# Patient Record
Sex: Female | Born: 1995 | Race: Black or African American | Hispanic: No | Marital: Single | Smoking: Current every day smoker
Health system: Southern US, Community
[De-identification: ages and names within clinical notes are randomized; demographics above are authoritative.]

## PROBLEM LIST (undated history)

## (undated) ENCOUNTER — Inpatient Hospital Stay (HOSPITAL_COMMUNITY): Payer: Self-pay

## (undated) DIAGNOSIS — F32A Depression, unspecified: Secondary | ICD-10-CM

## (undated) DIAGNOSIS — L309 Dermatitis, unspecified: Secondary | ICD-10-CM

## (undated) DIAGNOSIS — T148XXA Other injury of unspecified body region, initial encounter: Secondary | ICD-10-CM

## (undated) DIAGNOSIS — F329 Major depressive disorder, single episode, unspecified: Secondary | ICD-10-CM

## (undated) HISTORY — PX: INDUCED ABORTION: SHX677

## (undated) HISTORY — PX: WISDOM TOOTH EXTRACTION: SHX21

---
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
## 2013-01-29 ENCOUNTER — Encounter (HOSPITAL_COMMUNITY): Payer: Self-pay | Admitting: *Deleted

## 2013-01-29 ENCOUNTER — Inpatient Hospital Stay (HOSPITAL_COMMUNITY)
Admission: AD | Admit: 2013-01-29 | Discharge: 2013-01-29 | Disposition: A | Discharge status: Home / Self Care | Payer: Medicaid Other | Source: Ambulatory Visit | Attending: Obstetrics & Gynecology | Admitting: Obstetrics & Gynecology

## 2013-01-29 DIAGNOSIS — Z3201 Encounter for pregnancy test, result positive: Secondary | ICD-10-CM | POA: Insufficient documentation

## 2013-01-29 DIAGNOSIS — O210 Mild hyperemesis gravidarum: Secondary | ICD-10-CM

## 2013-01-29 DIAGNOSIS — R112 Nausea with vomiting, unspecified: Secondary | ICD-10-CM | POA: Insufficient documentation

## 2013-01-29 DIAGNOSIS — O219 Vomiting of pregnancy, unspecified: Secondary | ICD-10-CM

## 2013-01-29 LAB — URINE MICROSCOPIC-ADD ON

## 2013-01-29 LAB — URINALYSIS, ROUTINE W REFLEX MICROSCOPIC
Glucose, UA: NEGATIVE mg/dL
Ketones, ur: NEGATIVE mg/dL
Nitrite: NEGATIVE
Protein, ur: NEGATIVE mg/dL
Urobilinogen, UA: 0.2 mg/dL (ref 0.0–1.0)

## 2013-01-29 MED ORDER — PROMETHAZINE HCL 25 MG PO TABS: 25.0000 mg | ORAL_TABLET | Freq: Four times a day (QID) | ORAL | Status: DC | PRN

## 2013-01-29 MED ORDER — ONDANSETRON HCL 4 MG PO TABS: 4.0000 mg | ORAL_TABLET | Freq: Four times a day (QID) | ORAL | Status: DC

## 2013-01-29 NOTE — MAU Note (Signed)
Pt reports she has had constant nausea and vomiting for the past week.. Pt had positive pregnancy test at home.

## 2013-01-29 NOTE — MAU Provider Note (Signed)

## 2013-01-29 NOTE — MAU Provider Note (Signed)
History     CSN: [id number]  Arrival date and time: 01/29/13 1537   First Provider Initiated Contact with Patient 01/29/13 1627      Chief Complaint  Patient presents with  . Nausea   HPI Ms. Avagail A Oliva is a 17 y.o. G2P1001 at Unknown who presents to MAU today with N/V x 1 week. She denies abdominal pain, vaginal bleeding, discharge, fever, dizziness today. She has not had a period since the baby was born on 10/02/12.   OB History   Grav Para Term Preterm Abortions TAB SAB Ect Mult Living   2 1 1  0 0 0 0 0 0 1      Past Medical History  Diagnosis Date  . Asthma   . Eczema   . Fracture     ring finger right hand  . Asthma   . Depression     Past Surgical History  Procedure Laterality Date  . Wisdom tooth extraction      Family History  Problem Relation Age of Onset  . Hearing loss Neg Hx   . Diabetes Maternal Grandmother   . Hypertension Maternal Grandmother     History  Substance Use Topics  . Smoking status: Former Smoker -- 0.50 packs/day for .5 years    Types: Cigarettes    Quit date: 03/01/2012  . Smokeless tobacco: Never Used  . Alcohol Use: No     Comment: Pt currently smokes 3-4 x per week    Allergies:  Allergies  Allergen Reactions  . Shellfish Allergy Anaphylaxis    Prescriptions prior to admission  Medication Sig Dispense Refill  . acetaminophen (TYLENOL) 325 MG tablet Take 650 mg by mouth every 6 (six) hours as needed for pain.      Marland Kitchen albuterol (PROVENTIL HFA;VENTOLIN HFA) 108 (90 BASE) MCG/ACT inhaler Inhale 2 puffs into the lungs every 4 (four) hours as needed. For asthma      . HYDROcodone-ibuprofen (VICOPROFEN) 7.5-200 MG per tablet Take 1 tablet by mouth every 6 (six) hours as needed for pain.  15 tablet  0  . Prenatal Vit-Fe Fumarate-FA (PRENATAL MULTIVITAMIN) TABS Take 1 tablet by mouth daily at 12 noon.        Review of Systems  Constitutional: Negative for fever and malaise/fatigue.  Gastrointestinal: Positive for nausea  and vomiting. Negative for abdominal pain, diarrhea and constipation.  Genitourinary: Negative for dysuria, urgency and frequency.       Neg - vaginal bleeding, discharge  Neurological: Negative for dizziness, loss of consciousness and weakness.   Physical Exam   Blood pressure 109/87, pulse 74, temperature 98.3 F (36.8 C), temperature source Oral, resp. rate 18, height 5\' 1"  (1.549 m), weight 149 lb 12.8 oz (67.949 kg), last menstrual period 01/11/2012, not currently breastfeeding.  Physical Exam  Constitutional: She is oriented to person, place, and time. She appears well-developed and well-nourished. No distress.  HENT:  Head: Normocephalic and atraumatic.  Cardiovascular: Normal rate, regular rhythm and normal heart sounds.   Respiratory: Effort normal and breath sounds normal. No respiratory distress.  GI: Soft. Bowel sounds are normal. She exhibits no distension and no mass. There is no tenderness. There is no rebound and no guarding.  Neurological: She is alert and oriented to person, place, and time.  Skin: Skin is warm and dry. No erythema.  Psychiatric: She has a normal mood and affect.   Results for orders placed during the hospital encounter of 01/29/13 (from the past 24 hour(s))  URINALYSIS, ROUTINE  W REFLEX MICROSCOPIC     Status: Abnormal   Collection Time    01/29/13  3:53 PM      Result Value Range   Color, Urine YELLOW  YELLOW   APPearance HAZY (*) CLEAR   Specific Gravity, Urine 1.025  1.005 - 1.030   pH 6.0  5.0 - 8.0   Glucose, UA NEGATIVE  NEGATIVE mg/dL   Hgb urine dipstick NEGATIVE  NEGATIVE   Bilirubin Urine NEGATIVE  NEGATIVE   Ketones, ur NEGATIVE  NEGATIVE mg/dL   Protein, ur NEGATIVE  NEGATIVE mg/dL   Urobilinogen, UA 0.2  0.0 - 1.0 mg/dL   Nitrite NEGATIVE  NEGATIVE   Leukocytes, UA TRACE (*) NEGATIVE  URINE MICROSCOPIC-ADD ON     Status: Abnormal   Collection Time    01/29/13  3:53 PM      Result Value Range   Squamous Epithelial / LPF FEW (*)  RARE   WBC, UA 3-6  <3 WBC/hpf   Urine-Other MUCOUS PRESENT    POCT PREGNANCY, URINE     Status: Abnormal   Collection Time    01/29/13  4:08 PM      Result Value Range   Preg Test, Ur POSITIVE (*) NEGATIVE     MAU Course  Procedures None  MDM Patient is not nauseous now. Would like Rx to try at home. Patient does not desire pregnancy at this time and may terminate pregnancy.   Assessment and Plan  A: Positive pregnancy test Nausea and vomiting in pregnancy prior to [redacted] weeks gestation  P: Discharge home Rx for Zofran and Phenergan sent to patient's pharmacy Discussed first trimester warning signs Patient may return to MAU as needed or if her condition were to change or worsen  Freddi Starr, PA-C  01/29/2013, 4:27 PM

## 2013-02-17 ENCOUNTER — Encounter (HOSPITAL_COMMUNITY): Payer: Self-pay | Admitting: Emergency Medicine

## 2013-02-17 ENCOUNTER — Emergency Department (HOSPITAL_COMMUNITY)
Admission: EM | Admit: 2013-02-17 | Discharge: 2013-02-18 | Disposition: A | Discharge status: Other Acute Inpatient Hospital | Payer: Medicaid Other | Attending: Emergency Medicine | Admitting: Emergency Medicine

## 2013-02-17 DIAGNOSIS — F3289 Other specified depressive episodes: Secondary | ICD-10-CM | POA: Insufficient documentation

## 2013-02-17 DIAGNOSIS — J45909 Unspecified asthma, uncomplicated: Secondary | ICD-10-CM | POA: Insufficient documentation

## 2013-02-17 DIAGNOSIS — T4271XA Poisoning by unspecified antiepileptic and sedative-hypnotic drugs, accidental (unintentional), initial encounter: Secondary | ICD-10-CM | POA: Insufficient documentation

## 2013-02-17 DIAGNOSIS — Z8781 Personal history of (healed) traumatic fracture: Secondary | ICD-10-CM | POA: Insufficient documentation

## 2013-02-17 DIAGNOSIS — T426X2A Poisoning by other antiepileptic and sedative-hypnotic drugs, intentional self-harm, initial encounter: Secondary | ICD-10-CM | POA: Insufficient documentation

## 2013-02-17 DIAGNOSIS — Z872 Personal history of diseases of the skin and subcutaneous tissue: Secondary | ICD-10-CM | POA: Insufficient documentation

## 2013-02-17 DIAGNOSIS — O0990 Supervision of high risk pregnancy, unspecified, unspecified trimester: Secondary | ICD-10-CM

## 2013-02-17 DIAGNOSIS — O99340 Other mental disorders complicating pregnancy, unspecified trimester: Secondary | ICD-10-CM | POA: Insufficient documentation

## 2013-02-17 DIAGNOSIS — T4272XA Poisoning by unspecified antiepileptic and sedative-hypnotic drugs, intentional self-harm, initial encounter: Secondary | ICD-10-CM | POA: Insufficient documentation

## 2013-02-17 DIAGNOSIS — F329 Major depressive disorder, single episode, unspecified: Secondary | ICD-10-CM

## 2013-02-17 DIAGNOSIS — Z3490 Encounter for supervision of normal pregnancy, unspecified, unspecified trimester: Secondary | ICD-10-CM

## 2013-02-17 DIAGNOSIS — O9989 Other specified diseases and conditions complicating pregnancy, childbirth and the puerperium: Secondary | ICD-10-CM | POA: Insufficient documentation

## 2013-02-17 DIAGNOSIS — F32A Depression, unspecified: Secondary | ICD-10-CM

## 2013-02-17 DIAGNOSIS — Z87891 Personal history of nicotine dependence: Secondary | ICD-10-CM | POA: Insufficient documentation

## 2013-02-17 LAB — ACETAMINOPHEN LEVEL
Acetaminophen (Tylenol), Serum: <15 ug/mL (ref 10–30)
Acetaminophen (Tylenol), Serum: <15 ug/mL (ref 10–30)

## 2013-02-17 LAB — URINALYSIS, ROUTINE W REFLEX MICROSCOPIC
Bilirubin Urine: NEGATIVE
Leukocytes, UA: NEGATIVE
Nitrite: NEGATIVE
Specific Gravity, Urine: 1.018 (ref 1.005–1.030)
Urobilinogen, UA: 0.2 mg/dL (ref 0.0–1.0)
pH: 5.5 (ref 5.0–8.0)

## 2013-02-17 LAB — CBC WITH DIFFERENTIAL/PLATELET
Basophils Relative: 1 % (ref 0–1)
Eosinophils Absolute: 0.2 10*3/uL (ref 0.0–0.7)
Eosinophils Relative: 2 % (ref 0–5)
Hemoglobin: 12.2 g/dL (ref 12.0–15.0)
MCH: 29.1 pg (ref 26.0–34.0)
MCHC: 34 g/dL (ref 30.0–36.0)
MCV: 85.7 fL (ref 78.0–100.0)
Monocytes Relative: 8 % (ref 3–12)
Neutrophils Relative %: 45 % (ref 43–77)
Platelets: 264 10*3/uL (ref 150–400)

## 2013-02-17 LAB — COMPREHENSIVE METABOLIC PANEL
ALT: 13 U/L (ref 0–35)
AST: 17 U/L (ref 0–37)
Albumin: 4 g/dL (ref 3.5–5.2)
CO2: 22 mEq/L (ref 19–32)
Calcium: 9.5 mg/dL (ref 8.4–10.5)
Creatinine, Ser: 0.49 mg/dL — ABNORMAL LOW (ref 0.50–1.10)
GFR calc non Af Amer: >90 mL/min (ref >90)
Sodium: 132 mEq/L — ABNORMAL LOW (ref 135–145)
Total Protein: 7.4 g/dL (ref 6.0–8.3)

## 2013-02-17 LAB — RAPID URINE DRUG SCREEN, HOSP PERFORMED
Barbiturates: NOT DETECTED
Cocaine: NOT DETECTED
Tetrahydrocannabinol: POSITIVE — AB

## 2013-02-17 LAB — HCG, QUANTITATIVE, PREGNANCY: hCG, Beta Chain, Quant, S: 84855 m[IU]/mL — ABNORMAL HIGH (ref <5)

## 2013-02-17 LAB — SALICYLATE LEVEL: Salicylate Lvl: <2 mg/dL — ABNORMAL LOW (ref 2.8–20.0)

## 2013-02-17 MED ORDER — POTASSIUM CHLORIDE CRYS ER 20 MEQ PO TBCR
40.0000 meq | EXTENDED_RELEASE_TABLET | Freq: Three times a day (TID) | ORAL | Status: DC
  Administered 2013-02-17 – 2013-02-18 (×2): 40 meq via ORAL
  Filled 2013-02-17 (×2): qty 2

## 2013-02-17 MED ORDER — ONDANSETRON HCL 4 MG PO TABS: 4.0000 mg | ORAL_TABLET | Freq: Three times a day (TID) | ORAL | Status: DC | PRN

## 2013-02-17 NOTE — ED Notes (Signed)
Called Poison control , reported this case and spoke to Ms. Alexia Freestone, Charity fundraiser.  To monitor level of alertness. To call back for update.

## 2013-02-17 NOTE — ED Notes (Signed)
MD at bedside. 

## 2013-02-17 NOTE — ED Notes (Signed)
Telepsych ongoing. 

## 2013-02-17 NOTE — ED Notes (Signed)
Per EMS, pt. From home called mother for drug overdose  claimed of taking 8-10 tabs of 5-325 mg tab percocet , time unknown. Mother called EMS.  Pt. Was able to open the door upon EMS arrival but noted to be lethargic, received 1mg  of Narcan inhalation per EMS. Pt. Reported of being pregnant on her 1 trimester. Pt. Was seen in [facility] last 01/29/13. Pt. Gave birth on her 1st child on April of this year.
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
## 2013-02-18 ENCOUNTER — Encounter (HOSPITAL_COMMUNITY): Payer: Self-pay | Admitting: Registered Nurse

## 2013-02-18 DIAGNOSIS — O0990 Supervision of high risk pregnancy, unspecified, unspecified trimester: Secondary | ICD-10-CM

## 2013-02-18 DIAGNOSIS — F329 Major depressive disorder, single episode, unspecified: Secondary | ICD-10-CM

## 2013-02-18 NOTE — BH Assessment (Addendum)
[facility] adult unit is currently at capacity per Laverle Hobby, [facility]. Contacted the following facilities seeking placement:   [facility]: possible female bed available. Clinical information faxed. [facility]: possible female bed available. Clinical information faxed.  [facility]: unit at capacity  [facility]: unit at capacity  [facility]: unit at capacity  [facility]: unit at capacity  [facility]: unit at [facility]: unit at capacity  [facility]: unit at capacity  [facility]: unit at capacity  [facility] Patsy Baltimore, [facility], [facility] Triage Specialist

## 2013-02-18 NOTE — ED Notes (Addendum)
Pt's Grandmother in w/ pt

## 2013-02-18 NOTE — ED Notes (Signed)
Pt's mother notified of pending transfer to [facility].  Contact information for [facility] given.

## 2013-02-18 NOTE — ED Notes (Signed)
Pt is aware that she will transfer to [facility] today for admission.  Pt request this writer contact her mother to notify her.

## 2013-02-18 NOTE — [facility] Counselor (Signed)
Pt IVC was evaluated and completed by Dr. Theotis Barrio the rounding psychiatrist in ED.  IVC was faxed to magistrate and received.  Original IVC and examination form put in IVC log along with fax confirmation it went to Magistrate.    Nurse to arrange with [facility] on transport to [facility].    Nurse has everything she needs to coordinate discharge and transport.

## 2013-02-18 NOTE — ED Provider Notes (Signed)
Pt has been seen by psychiatry, has been accepted to [facility], but will not be available until around 5 PM.    Gavin Pound. Domenic Schoenberger, MD 02/18/13 1134

## 2013-02-18 NOTE — ED Notes (Signed)
Sheriff is here to transport 

## 2013-02-18 NOTE — BH Assessment (Signed)
[facility] Assessment Progress Note Update:  Received call from [facility] @ [phone] stating pt accepted to Dr. Shawnie Dapper and that she could be transported there once under IVC.  Called Scherrie Merritts at [facility] to inform him as well as ED staff to arrange to have IVC paperwork completed so that pt can be transported to [facility].  Nurse to nurse report number to call is [phone].

## 2013-02-18 NOTE — ED Notes (Signed)
Receipt of IVC papers confirmed w/ magistrate.  Sheriff will pick IVC papers up and transport the pt after 1700.  Message left on sheriff's dept transport line requesting transport .
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
## 2013-02-18 NOTE — ED Notes (Signed)
Shuvon NP in w/ pt and grandmother

## 2013-02-18 NOTE — ED Notes (Signed)
Up to the desk on the phone 

## 2013-02-18 NOTE — ED Notes (Signed)
Pt's mom into see 

## 2013-02-18 NOTE — ED Notes (Signed)
Dr Pietro Cassis into see

## 2013-02-18 NOTE — ED Notes (Addendum)
Patients cell phone given to her mom at her request, medications from pharmacy also given to patients mother.  Pt Ambulatory w/ sheriff, 1 bag of belongings given to sheriff.  MAR, EMTELA, transfer report, EKG, face sheet sent w/ sheriff.

## 2013-02-18 NOTE — ED Notes (Signed)
Up to the bathroom 

## 2013-06-24 ENCOUNTER — Inpatient Hospital Stay (HOSPITAL_COMMUNITY)
Admission: AD | Admit: 2013-06-24 | Discharge: 2013-06-24 | Disposition: A | Discharge status: Home / Self Care | Payer: Medicaid Other | Source: Ambulatory Visit | Attending: Obstetrics & Gynecology | Admitting: Obstetrics & Gynecology

## 2013-06-24 NOTE — MAU Note (Signed)
Pt arrived with young daughter, visitation policy regarding children explained to pt.  Pt chose not to be evaluated due to lack of child care. Pt left without being triaged.

## 2013-12-04 ENCOUNTER — Encounter (HOSPITAL_COMMUNITY): Payer: Self-pay | Admitting: *Deleted
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
## 2014-01-30 ENCOUNTER — Encounter (HOSPITAL_COMMUNITY): Payer: Self-pay | Admitting: Emergency Medicine

## 2014-01-30 ENCOUNTER — Emergency Department (HOSPITAL_COMMUNITY)
Admission: EM | Admit: 2014-01-30 | Discharge: 2014-01-30 | Disposition: A | Discharge status: Home / Self Care | Payer: Medicaid Other | Attending: Emergency Medicine | Admitting: Emergency Medicine

## 2014-01-30 DIAGNOSIS — IMO0001 Reserved for inherently not codable concepts without codable children: Secondary | ICD-10-CM | POA: Insufficient documentation

## 2014-01-30 DIAGNOSIS — Z872 Personal history of diseases of the skin and subcutaneous tissue: Secondary | ICD-10-CM | POA: Insufficient documentation

## 2014-01-30 DIAGNOSIS — S46909A Unspecified injury of unspecified muscle, fascia and tendon at shoulder and upper arm level, unspecified arm, initial encounter: Secondary | ICD-10-CM | POA: Insufficient documentation

## 2014-01-30 DIAGNOSIS — S161XXA Strain of muscle, fascia and tendon at neck level, initial encounter: Secondary | ICD-10-CM

## 2014-01-30 DIAGNOSIS — S199XXA Unspecified injury of neck, initial encounter: Secondary | ICD-10-CM

## 2014-01-30 DIAGNOSIS — Y9289 Other specified places as the place of occurrence of the external cause: Secondary | ICD-10-CM | POA: Insufficient documentation

## 2014-01-30 DIAGNOSIS — Z8659 Personal history of other mental and behavioral disorders: Secondary | ICD-10-CM | POA: Insufficient documentation

## 2014-01-30 DIAGNOSIS — S139XXA Sprain of joints and ligaments of unspecified parts of neck, initial encounter: Secondary | ICD-10-CM | POA: Insufficient documentation

## 2014-01-30 DIAGNOSIS — J45909 Unspecified asthma, uncomplicated: Secondary | ICD-10-CM | POA: Insufficient documentation

## 2014-01-30 DIAGNOSIS — Y9389 Activity, other specified: Secondary | ICD-10-CM | POA: Insufficient documentation

## 2014-01-30 DIAGNOSIS — Z87891 Personal history of nicotine dependence: Secondary | ICD-10-CM | POA: Insufficient documentation

## 2014-01-30 DIAGNOSIS — S0993XA Unspecified injury of face, initial encounter: Secondary | ICD-10-CM | POA: Insufficient documentation

## 2014-01-30 DIAGNOSIS — Z8781 Personal history of (healed) traumatic fracture: Secondary | ICD-10-CM | POA: Insufficient documentation

## 2014-01-30 DIAGNOSIS — S4980XA Other specified injuries of shoulder and upper arm, unspecified arm, initial encounter: Secondary | ICD-10-CM | POA: Insufficient documentation

## 2014-01-30 DIAGNOSIS — W010XXA Fall on same level from slipping, tripping and stumbling without subsequent striking against object, initial encounter: Secondary | ICD-10-CM | POA: Insufficient documentation

## 2014-01-30 MED ORDER — NAPROXEN 500 MG PO TABS: 500.0000 mg | ORAL_TABLET | Freq: Two times a day (BID) | ORAL | Status: DC

## 2014-01-30 MED ORDER — METHOCARBAMOL 500 MG PO TABS: 500.0000 mg | ORAL_TABLET | Freq: Two times a day (BID) | ORAL | Status: DC

## 2014-01-30 NOTE — ED Notes (Signed)
Per pt, states she tripped and fell and now has neck pain

## 2014-01-30 NOTE — ED Provider Notes (Signed)
CSN: [id number]     Arrival date & time 01/30/14  1415 History  This chart was scribed for non-physician practitioner, Santiago GladHeather Coralyn Roselli, PA-C,working with Joya Gaskinsonald W Wickline, MD, by Karle PlumberJennifer Tensley, ED Scribe. This patient was seen in room WTR8/WTR8 and the patient's care was started at 2:44 PM.  Chief Complaint  Patient presents with  . Neck Pain   The history is provided by the patient. No language interpreter was used.   HPI Comments:  Lori Lloyd is a 18 y.o. female who presents to the Emergency Department complaining of moderate right sided neck pain secondary to tripping and falling landing on her head approximately 12 hours ago. Pt endorses associated right shoulder pain. She denies taking anything for the pain. She denies LOC, nausea, vomiting, vision changes, numbness or tingling.  Past Medical History  Diagnosis Date  . Asthma   . Eczema   . Fracture     ring finger right hand  . Asthma   . Depression    Past Surgical History  Procedure Laterality Date  . Wisdom tooth extraction     Family History  Problem Relation Age of Onset  . Hearing loss Neg Hx   . Diabetes Maternal Grandmother   . Hypertension Maternal Grandmother    History  Substance Use Topics  . Smoking status: Former Smoker -- 0.50 packs/day for .5 years    Types: Cigarettes    Quit date: 03/01/2012  . Smokeless tobacco: Never Used  . Alcohol Use: No     Comment: Pt currently smokes 3-4 x per week   OB History   Grav Para Term Preterm Abortions TAB SAB Ect Mult Living   2 1 1  0 0 0 0 0 0 1     Review of Systems  Gastrointestinal: Negative for nausea and vomiting.  Musculoskeletal: Positive for myalgias and neck pain.  Neurological: Negative for syncope and numbness.  All other systems reviewed and are negative.   Allergies  Shellfish allergy  Home Medications   Prior to Admission medications   Not on File   Triage Vitals: BP 101/69  Pulse 89  Temp(Src) 98.5 F (36.9 C) (Oral)   Resp 18  SpO2 100%  LMP 01/30/2014 Physical Exam  Nursing note and vitals reviewed. Constitutional: She is oriented to person, place, and time. She appears well-developed and well-nourished.  HENT:  Head: Normocephalic and atraumatic.  Eyes: EOM are normal. Pupils are equal, round, and reactive to light.  Neck: Normal range of motion.  Cardiovascular: Normal rate, regular rhythm and normal heart sounds.  Exam reveals no gallop and no friction rub.   No murmur heard. 2+ radial pulses bilaterally.  Pulmonary/Chest: Effort normal and breath sounds normal. No respiratory distress. She has no wheezes. She has no rales.  Musculoskeletal: Normal range of motion.       Right shoulder: She exhibits normal range of motion and no bony tenderness.  No midline spinal tenderness to palpation of cervical, thoracic, or lumbar spine. Full ROM of both shoulders.  Right sided paraspinal tenderness to palpation. No bruising, swelling, or erythema. Increased pain with lateral rotation and extension of the neck. No pain with flexion of neck.  Neurological: She is alert and oriented to person, place, and time. No cranial nerve deficit.  Distal sensation intact bilaterally. Grip strength 5/5 bilaterally. Cranial nerves intact.  Skin: Skin is warm and dry.  Psychiatric: She has a normal mood and affect. Her behavior is normal.    ED Course  Procedures (including critical care time) DIAGNOSTIC STUDIES: Oxygen Saturation is 100% on RA, normal by my interpretation.   COORDINATION OF CARE: 2:51 PM- Will prescribe muscle relaxer and NSAID. Pt verbalizes understanding and agrees to plan.  Medications - No data to display  Labs Review Labs Reviewed - No data to display  Imaging Review No results found.   EKG Interpretation None      MDM   Final diagnoses:  None   Patient presenting today with right sided neck pain that has been present since falling approximately 12 hours ago.  She denies any LOC  when she fell.  No signs of head trauma.  She has no spinal tenderness to palpation on exam.  Therefore, do not feel that imaging is indicated.  She is neurovascularly intact.  Patient instructed to take Naproxen and also given Rx for Robaxin.  Patient stable for discharge.  Return precautions given.  I personally performed the services described in this documentation, which was scribed in my presence. The recorded information has been reviewed and is accurate.    Santiago Glad, PA-C 01/30/14 1502

## 2014-01-30 NOTE — ED Provider Notes (Signed)
Medical screening examination/treatment/procedure(s) were performed by non-physician practitioner and as supervising physician I was immediately available for consultation/collaboration.   EKG Interpretation None        Joya Gaskinsonald W Roselyn Doby, MD 01/30/14 1512

## 2014-04-22 ENCOUNTER — Encounter (HOSPITAL_COMMUNITY): Payer: Self-pay | Admitting: Emergency Medicine

## 2014-06-02 ENCOUNTER — Inpatient Hospital Stay (HOSPITAL_COMMUNITY): Payer: Medicaid Other

## 2014-06-02 ENCOUNTER — Inpatient Hospital Stay (HOSPITAL_COMMUNITY)
Admission: AD | Admit: 2014-06-02 | Discharge: 2014-06-02 | Disposition: A | Discharge status: Home / Self Care | Payer: Medicaid Other | Source: Ambulatory Visit | Attending: Obstetrics | Admitting: Obstetrics

## 2014-06-02 ENCOUNTER — Encounter (HOSPITAL_COMMUNITY): Payer: Self-pay | Admitting: *Deleted

## 2014-06-02 DIAGNOSIS — R102 Pelvic and perineal pain: Secondary | ICD-10-CM

## 2014-06-02 DIAGNOSIS — O99331 Smoking (tobacco) complicating pregnancy, first trimester: Secondary | ICD-10-CM | POA: Insufficient documentation

## 2014-06-02 DIAGNOSIS — F17210 Nicotine dependence, cigarettes, uncomplicated: Secondary | ICD-10-CM | POA: Insufficient documentation

## 2014-06-02 DIAGNOSIS — Z3A01 Less than 8 weeks gestation of pregnancy: Secondary | ICD-10-CM | POA: Insufficient documentation

## 2014-06-02 DIAGNOSIS — O26891 Other specified pregnancy related conditions, first trimester: Secondary | ICD-10-CM

## 2014-06-02 DIAGNOSIS — N949 Unspecified condition associated with female genital organs and menstrual cycle: Secondary | ICD-10-CM | POA: Insufficient documentation

## 2014-06-02 DIAGNOSIS — O9989 Other specified diseases and conditions complicating pregnancy, childbirth and the puerperium: Secondary | ICD-10-CM | POA: Insufficient documentation

## 2014-06-02 LAB — WET PREP, GENITAL
Clue Cells Wet Prep HPF POC: NONE SEEN
Trich, Wet Prep: NONE SEEN
YEAST WET PREP: NONE SEEN

## 2014-06-02 LAB — URINALYSIS, ROUTINE W REFLEX MICROSCOPIC
BILIRUBIN URINE: NEGATIVE
GLUCOSE, UA: NEGATIVE mg/dL
HGB URINE DIPSTICK: NEGATIVE
KETONES UR: NEGATIVE mg/dL
Leukocytes, UA: NEGATIVE
Nitrite: NEGATIVE
PROTEIN: NEGATIVE mg/dL
Specific Gravity, Urine: <1.005 — ABNORMAL LOW (ref 1.005–1.030)
UROBILINOGEN UA: 0.2 mg/dL (ref 0.0–1.0)
pH: 6 (ref 5.0–8.0)

## 2014-06-02 LAB — CBC
HEMATOCRIT: 35.3 % — AB (ref 36.0–46.0)
HEMOGLOBIN: 11.8 g/dL — AB (ref 12.0–15.0)
MCH: 29.6 pg (ref 26.0–34.0)
MCHC: 33.4 g/dL (ref 30.0–36.0)
MCV: 88.7 fL (ref 78.0–100.0)
Platelets: 284 10*3/uL (ref 150–400)
RBC: 3.98 MIL/uL (ref 3.87–5.11)
RDW: 12.5 % (ref 11.5–15.5)
WBC: 8.5 10*3/uL (ref 4.0–10.5)

## 2014-06-02 LAB — POCT PREGNANCY, URINE: PREG TEST UR: POSITIVE — AB

## 2014-06-02 LAB — HCG, QUANTITATIVE, PREGNANCY: hCG, Beta Chain, Quant, S: 8900 m[IU]/mL — ABNORMAL HIGH (ref <5)

## 2014-06-02 NOTE — Discharge Instructions (Signed)
First Trimester of Pregnancy The first trimester of pregnancy is from week 1 until the end of week 12 (months 1 through 3). A week after a sperm fertilizes an egg, the egg will implant on the wall of the uterus. This embryo will begin to develop into a baby. Genes from you and your partner are forming the baby. The female genes determine whether the baby is a boy or a girl. At 6-8 weeks, the eyes and face are formed, and the heartbeat can be seen on ultrasound. At the end of 12 weeks, all the baby's organs are formed.  Now that you are pregnant, you will want to do everything you can to have a healthy baby. Two of the most important things are to get good prenatal care and to follow your health care provider's instructions. Prenatal care is all the medical care you receive before the baby's birth. This care will help prevent, find, and treat any problems during the pregnancy and childbirth. BODY CHANGES Your body goes through many changes during pregnancy. The changes vary from woman to woman.   You may gain or lose a couple of pounds at first.  You may feel sick to your stomach (nauseous) and throw up (vomit). If the vomiting is uncontrollable, call your health care provider.  You may tire easily.  You may develop headaches that can be relieved by medicines approved by your health care provider.  You may urinate more often. Painful urination may mean you have a bladder infection.  You may develop heartburn as a result of your pregnancy.  You may develop constipation because certain hormones are causing the muscles that push waste through your intestines to slow down.  You may develop hemorrhoids or swollen, bulging veins (varicose veins).  Your breasts may begin to grow larger and become tender. Your nipples may stick out more, and the tissue that surrounds them (areola) may become darker.  Your gums may bleed and may be sensitive to brushing and flossing.  Dark spots or blotches (chloasma,  mask of pregnancy) may develop on your face. This will likely fade after the baby is born.  Your menstrual periods will stop.  You may have a loss of appetite.  You may develop cravings for certain kinds of food.  You may have changes in your emotions from day to day, such as being excited to be pregnant or being concerned that something may go wrong with the pregnancy and baby.  You may have more vivid and strange dreams.  You may have changes in your hair. These can include thickening of your hair, rapid growth, and changes in texture. Some women also have hair loss during or after pregnancy, or hair that feels dry or thin. Your hair will most likely return to normal after your baby is born. WHAT TO EXPECT AT YOUR PRENATAL VISITS During a routine prenatal visit:  You will be weighed to make sure you and the baby are growing normally.  Your blood pressure will be taken.  Your abdomen will be measured to track your baby's growth.  The fetal heartbeat will be listened to starting around week 10 or 12 of your pregnancy.  Test results from any previous visits will be discussed. Your health care provider may ask you:  How you are feeling.  If you are feeling the baby move.  If you have had any abnormal symptoms, such as leaking fluid, bleeding, severe headaches, or abdominal cramping.  If you have any questions. Other tests   that may be performed during your first trimester include:  Blood tests to find your blood type and to check for the presence of any previous infections. They will also be used to check for low iron levels (anemia) and Rh antibodies. Later in the pregnancy, blood tests for diabetes will be done along with other tests if problems develop.  Urine tests to check for infections, diabetes, or protein in the urine.  An ultrasound to confirm the proper growth and development of the baby.  An amniocentesis to check for possible genetic problems.  Fetal screens for  spina bifida and Down syndrome.  You may need other tests to make sure you and the baby are doing well. HOME CARE INSTRUCTIONS  Medicines  Follow your health care provider's instructions regarding medicine use. Specific medicines may be either safe or unsafe to take during pregnancy.  Take your prenatal vitamins as directed.  If you develop constipation, try taking a stool softener if your health care provider approves. Diet  Eat regular, well-balanced meals. Choose a variety of foods, such as meat or vegetable-based protein, fish, milk and low-fat dairy products, vegetables, fruits, and whole grain breads and cereals. Your health care provider will help you determine the amount of weight gain that is right for you.  Avoid raw meat and uncooked cheese. These carry germs that can cause birth defects in the baby.  Eating four or five small meals rather than three large meals a day may help relieve nausea and vomiting. If you start to feel nauseous, eating a few soda crackers can be helpful. Drinking liquids between meals instead of during meals also seems to help nausea and vomiting.  If you develop constipation, eat more high-fiber foods, such as fresh vegetables or fruit and whole grains. Drink enough fluids to keep your urine clear or pale yellow. Activity and Exercise  Exercise only as directed by your health care provider. Exercising will help you:  Control your weight.  Stay in shape.  Be prepared for labor and delivery.  Experiencing pain or cramping in the lower abdomen or low back is a good sign that you should stop exercising. Check with your health care provider before continuing normal exercises.  Try to avoid standing for long periods of time. Move your legs often if you must stand in one place for a long time.  Avoid heavy lifting.  Wear low-heeled shoes, and practice good posture.  You may continue to have sex unless your health care provider directs you  otherwise. Relief of Pain or Discomfort  Wear a good support bra for breast tenderness.   Take warm sitz baths to soothe any pain or discomfort caused by hemorrhoids. Use hemorrhoid cream if your health care provider approves.   Rest with your legs elevated if you have leg cramps or low back pain.  If you develop varicose veins in your legs, wear support hose. Elevate your feet for 15 minutes, 3-4 times a day. Limit salt in your diet. Prenatal Care  Schedule your prenatal visits by the twelfth week of pregnancy. They are usually scheduled monthly at first, then more often in the last 2 months before delivery.  Write down your questions. Take them to your prenatal visits.  Keep all your prenatal visits as directed by your health care provider. Safety  Wear your seat belt at all times when driving.  Make a list of emergency phone numbers, including numbers for family, friends, the hospital, and police and fire departments. General Tips    Ask your health care provider for a referral to a local prenatal education class. Begin classes no later than at the beginning of month 6 of your pregnancy.  Ask for help if you have counseling or nutritional needs during pregnancy. Your health care provider can offer advice or refer you to specialists for help with various needs.  Do not use hot tubs, steam rooms, or saunas.  Do not douche or use tampons or scented sanitary pads.  Do not cross your legs for long periods of time.  Avoid cat litter boxes and soil used by cats. These carry germs that can cause birth defects in the baby and possibly loss of the fetus by miscarriage or stillbirth.  Avoid all smoking, herbs, alcohol, and medicines not prescribed by your health care provider. Chemicals in these affect the formation and growth of the baby.  Schedule a dentist appointment. At home, brush your teeth with a soft toothbrush and be gentle when you floss. SEEK MEDICAL CARE IF:   You have  dizziness.  You have mild pelvic cramps, pelvic pressure, or nagging pain in the abdominal area.  You have persistent nausea, vomiting, or diarrhea.  You have a bad smelling vaginal discharge.  You have pain with urination.  You notice increased swelling in your face, hands, legs, or ankles. SEEK IMMEDIATE MEDICAL CARE IF:   You have a fever.  You are leaking fluid from your vagina.  You have spotting or bleeding from your vagina.  You have severe abdominal cramping or pain.  You have rapid weight gain or loss.  You vomit blood or material that looks like coffee grounds.  You are exposed to German measles and have never had them.  You are exposed to fifth disease or chickenpox.  You develop a severe headache.  You have shortness of breath.  You have any kind of trauma, such as from a fall or a car accident. Document Released: 06/01/2001 Document Revised: 10/22/2013 Document Reviewed: 04/17/2013 [employer] Patient Information 2015 [employer]. This information is not intended to replace advice given to you by your health care provider. Make sure you discuss any questions you have with your health care provider.  

## 2014-06-02 NOTE — MAU Note (Signed)
Abdominal pain, all over abdomen, comes & goes, sharp; x 3 days. Denies vaginal bleeding. Vaginal discharge, white, no odor. LMP 04/23/14, no birth control. Positive pregnancy test 2 days ago.

## 2014-06-02 NOTE — MAU Provider Note (Signed)
History     CSN: [id number]  Arrival date and time: 06/02/14 2109   First Provider Initiated Contact with Patient 06/02/14 2158      Chief Complaint  Patient presents with  . Possible Pregnancy  . Abdominal Cramping   HPI Lori Lloyd is a 18 y.o. G3P1011 at Unknown who presents today with sharp lower abdominal pain. She states that the pain started about 3 days ago. She took a pregnancy test at home that was positive. She denies any vaginal bleeding.    Past Medical History  Diagnosis Date  . Asthma   . Eczema   . Fracture     ring finger right hand  . Asthma   . Depression     Past Surgical History  Procedure Laterality Date  . Wisdom tooth extraction      Family History  Problem Relation Age of Onset  . Hearing loss Neg Hx   . Diabetes Maternal Grandmother   . Hypertension Maternal Grandmother     History  Substance Use Topics  . Smoking status: Former Smoker -- 0.50 packs/day for .5 years    Types: Cigarettes    Quit date: 03/01/2012  . Smokeless tobacco: Never Used  . Alcohol Use: No     Comment: Pt currently smokes 3-4 x per week    Allergies:  Allergies  Allergen Reactions  . Shellfish Allergy Anaphylaxis    Prescriptions prior to admission  Medication Sig Dispense Refill Last Dose  . methocarbamol (ROBAXIN) 500 MG tablet Take 1 tablet (500 mg total) by mouth 2 (two) times daily. 20 tablet 0   . naproxen (NAPROSYN) 500 MG tablet Take 1 tablet (500 mg total) by mouth 2 (two) times daily. 30 tablet 0     ROS Physical Exam   Blood pressure 97/45, pulse 107, temperature 99.4 F (37.4 C), temperature source Oral, resp. rate 16, height 5\' 1"  (1.549 m), weight 55.248 kg (121 lb 12.8 oz), last menstrual period 04/23/2014, SpO2 100 %, unknown if currently breastfeeding.  Physical Exam  Nursing note and vitals reviewed. Constitutional: She is oriented to person, place, and time. She appears well-developed and well-nourished. No distress.   Cardiovascular: Normal rate.   Respiratory: Effort normal.  GI: Soft. There is no tenderness. There is no rebound.  Genitourinary:   External: no lesion Vagina: small amount of white discharge Cervix: pink, smooth, no CMT Uterus: NSSC Adnexa: NT   Neurological: She is alert and oriented to person, place, and time.  Skin: Skin is warm and dry.  Psychiatric: She has a normal mood and affect.    MAU Course  Procedures  Results for orders placed or performed during the hospital encounter of 06/02/14 (from the past 24 hour(s))  Urinalysis, Routine w reflex microscopic     Status: Abnormal   Collection Time: 06/02/14  9:20 PM  Result Value Ref Range   Color, Urine YELLOW YELLOW   APPearance CLEAR CLEAR   Specific Gravity, Urine <1.005 (L) 1.005 - 1.030   pH 6.0 5.0 - 8.0   Glucose, UA NEGATIVE NEGATIVE mg/dL   Hgb urine dipstick NEGATIVE NEGATIVE   Bilirubin Urine NEGATIVE NEGATIVE   Ketones, ur NEGATIVE NEGATIVE mg/dL   Protein, ur NEGATIVE NEGATIVE mg/dL   Urobilinogen, UA 0.2 0.0 - 1.0 mg/dL   Nitrite NEGATIVE NEGATIVE   Leukocytes, UA NEGATIVE NEGATIVE  Pregnancy, urine POC     Status: Abnormal   Collection Time: 06/02/14  9:30 PM  Result Value Ref Range  Preg Test, Ur POSITIVE (A) NEGATIVE  CBC     Status: Abnormal   Collection Time: 06/02/14  9:40 PM  Result Value Ref Range   WBC 8.5 4.0 - 10.5 K/uL   RBC 3.98 3.87 - 5.11 MIL/uL   Hemoglobin 11.8 (L) 12.0 - 15.0 g/dL   HCT 45.4 (L) 09.8 - 11.9 %   MCV 88.7 78.0 - 100.0 fL   MCH 29.6 26.0 - 34.0 pg   MCHC 33.4 30.0 - 36.0 g/dL   RDW 14.7 82.9 - 56.2 %   Platelets 284 150 - 400 K/uL  hCG, quantitative, pregnancy     Status: Abnormal   Collection Time: 06/02/14  9:40 PM  Result Value Ref Range   hCG, Beta Chain, Quant, S 8900 (H) <5 mIU/mL  Wet prep, genital     Status: Abnormal   Collection Time: 06/02/14 10:03 PM  Result Value Ref Range   Yeast Wet Prep HPF POC NONE SEEN NONE SEEN   Trich, Wet Prep NONE  SEEN NONE SEEN   Clue Cells Wet Prep HPF POC NONE SEEN NONE SEEN   WBC, Wet Prep HPF POC MODERATE (A) NONE SEEN   US Ob Comp Less 14 Wks  06/02/2014   CLINICAL DATA:  Abdominal pain and cramping, pregnancy.  EXAM: OBSTETRIC <14 WK [place] AND TRANSVAGINAL OB US  TECHNIQUE: Both transabdominal and transvaginal ultrasound examinations were performed for complete evaluation of the gestation as well as the maternal uterus, adnexal regions, and pelvic cul-de-sac. Transvaginal technique was performed to assess early pregnancy.  COMPARISON:  None.  FINDINGS: Intrauterine gestational sac: Visualized/normal in shape.  Yolk sac:  Visualized.  Embryo:  Probably present.  Cardiac Activity: Appears to be present on cine images.  Heart Rate:  Cannot obtain tracing at this time.  MSD:  10.5  mm   5 w   5  d  Maternal uterus/adnexae: Probable corpus luteum cyst seen in right ovary. Left ovary appears normal. Trace free fluid is noted.  IMPRESSION: Probable early intrauterine gestational sac with yolk sac and probable fetal pole with cardiac activity, but too small to obtain heart rate tracing at this time. Recommend follow-up quantitative B-HCG levels and follow-up US in 14 days to confirm and assess viability. This recommendation follows SRU consensus guidelines: Diagnostic Criteria for Nonviable Pregnancy Early in the First Trimester. Malva Limes Med 2013; 130:8657-84.   Electronically Signed   By: Roque Lias M.D.   On: 06/02/2014 23:05   US Ob Transvaginal  06/02/2014   CLINICAL DATA:  Abdominal pain and cramping, pregnancy.  EXAM: OBSTETRIC <14 WK [place] AND TRANSVAGINAL OB US  TECHNIQUE: Both transabdominal and transvaginal ultrasound examinations were performed for complete evaluation of the gestation as well as the maternal uterus, adnexal regions, and pelvic cul-de-sac. Transvaginal technique was performed to assess early pregnancy.  COMPARISON:  None.  FINDINGS: Intrauterine gestational sac: Visualized/normal in shape.   Yolk sac:  Visualized.  Embryo:  Probably present.  Cardiac Activity: Appears to be present on cine images.  Heart Rate:  Cannot obtain tracing at this time.  MSD:  10.5  mm   5 w   5  d  Maternal uterus/adnexae: Probable corpus luteum cyst seen in right ovary. Left ovary appears normal. Trace free fluid is noted.  IMPRESSION: Probable early intrauterine gestational sac with yolk sac and probable fetal pole with cardiac activity, but too small to obtain heart rate tracing at this time. Recommend follow-up quantitative B-HCG levels and follow-up  US in 14 days to confirm and assess viability. This recommendation follows SRU consensus guidelines: Diagnostic Criteria for Nonviable Pregnancy Early in the First Trimester. Malva Limes Engl J Med 2013; 960:4540-98; 369:1443-51.   Electronically Signed   By: Roque LiasJames  Green M.D.   On: 06/02/2014 23:05     Assessment and Plan   1. Pelvic pain affecting pregnancy in first trimester, antepartum    First trimester precautions reviewed Return to MAU as needed  Follow-up Information    Follow up with Kathreen CosierMARSHALL,BERNARD A, MD.   Specialty:  Obstetrics and Gynecology   Contact information:   [address] [place] [address] [phone]        Tawnya CrookHogan, Heather Donovan 06/02/2014, 10:11 PM

## 2014-06-03 LAB — HIV ANTIBODY (ROUTINE TESTING W REFLEX): HIV 1&2 Ab, 4th Generation: NONREACTIVE

## 2014-06-04 LAB — GC/CHLAMYDIA PROBE AMP
CT PROBE, AMP APTIMA: NEGATIVE
GC PROBE AMP APTIMA: NEGATIVE

## 2014-06-25 ENCOUNTER — Emergency Department (HOSPITAL_COMMUNITY)
Admission: EM | Admit: 2014-06-25 | Discharge: 2014-06-25 | Disposition: A | Discharge status: Home / Self Care | Payer: Medicaid Other | Attending: Emergency Medicine | Admitting: Emergency Medicine

## 2014-06-25 ENCOUNTER — Encounter (HOSPITAL_COMMUNITY): Payer: Self-pay

## 2014-06-25 DIAGNOSIS — A084 Viral intestinal infection, unspecified: Secondary | ICD-10-CM | POA: Insufficient documentation

## 2014-06-25 DIAGNOSIS — Z872 Personal history of diseases of the skin and subcutaneous tissue: Secondary | ICD-10-CM | POA: Insufficient documentation

## 2014-06-25 DIAGNOSIS — J45909 Unspecified asthma, uncomplicated: Secondary | ICD-10-CM | POA: Insufficient documentation

## 2014-06-25 DIAGNOSIS — Z3490 Encounter for supervision of normal pregnancy, unspecified, unspecified trimester: Secondary | ICD-10-CM

## 2014-06-25 DIAGNOSIS — Z87891 Personal history of nicotine dependence: Secondary | ICD-10-CM | POA: Insufficient documentation

## 2014-06-25 DIAGNOSIS — Z8781 Personal history of (healed) traumatic fracture: Secondary | ICD-10-CM | POA: Insufficient documentation

## 2014-06-25 DIAGNOSIS — O99611 Diseases of the digestive system complicating pregnancy, first trimester: Secondary | ICD-10-CM | POA: Insufficient documentation

## 2014-06-25 DIAGNOSIS — Z8659 Personal history of other mental and behavioral disorders: Secondary | ICD-10-CM | POA: Insufficient documentation

## 2014-06-25 DIAGNOSIS — Z3A08 8 weeks gestation of pregnancy: Secondary | ICD-10-CM | POA: Insufficient documentation

## 2014-06-25 LAB — HCG, QUANTITATIVE, PREGNANCY: HCG, BETA CHAIN, QUANT, S: 92309 m[IU]/mL — AB (ref <5)

## 2014-06-25 LAB — CBC WITH DIFFERENTIAL/PLATELET
BASOS ABS: 0 10*3/uL (ref 0.0–0.1)
Basophils Relative: 0 % (ref 0–1)
EOS PCT: 1 % (ref 0–5)
Eosinophils Absolute: 0 10*3/uL (ref 0.0–0.7)
HEMATOCRIT: 38.5 % (ref 36.0–46.0)
HEMOGLOBIN: 12.6 g/dL (ref 12.0–15.0)
LYMPHS ABS: 0.7 10*3/uL (ref 0.7–4.0)
Lymphocytes Relative: 11 % — ABNORMAL LOW (ref 12–46)
MCH: 29.7 pg (ref 26.0–34.0)
MCHC: 32.7 g/dL (ref 30.0–36.0)
MCV: 90.8 fL (ref 78.0–100.0)
MONO ABS: 0.4 10*3/uL (ref 0.1–1.0)
MONOS PCT: 6 % (ref 3–12)
NEUTROS ABS: 5.3 10*3/uL (ref 1.7–7.7)
Neutrophils Relative %: 82 % — ABNORMAL HIGH (ref 43–77)
Platelets: 204 10*3/uL (ref 150–400)
RBC: 4.24 MIL/uL (ref 3.87–5.11)
RDW: 13.2 % (ref 11.5–15.5)
WBC: 6.4 10*3/uL (ref 4.0–10.5)

## 2014-06-25 LAB — COMPREHENSIVE METABOLIC PANEL
ALBUMIN: 4.4 g/dL (ref 3.5–5.2)
ALT: 28 U/L (ref 0–35)
AST: 28 U/L (ref 0–37)
Alkaline Phosphatase: 55 U/L (ref 39–117)
Anion gap: 9 (ref 5–15)
BILIRUBIN TOTAL: 1.2 mg/dL (ref 0.3–1.2)
BUN: 11 mg/dL (ref 6–23)
CALCIUM: 8.8 mg/dL (ref 8.4–10.5)
CHLORIDE: 106 meq/L (ref 96–112)
CO2: 22 mmol/L (ref 19–32)
CREATININE: 0.45 mg/dL — AB (ref 0.50–1.10)
GFR calc Af Amer: >90 mL/min (ref >90)
GFR calc non Af Amer: >90 mL/min (ref >90)
Glucose, Bld: 87 mg/dL (ref 70–99)
Potassium: 3.6 mmol/L (ref 3.5–5.1)
SODIUM: 137 mmol/L (ref 135–145)
Total Protein: 7.3 g/dL (ref 6.0–8.3)

## 2014-06-25 LAB — URINALYSIS, ROUTINE W REFLEX MICROSCOPIC
BILIRUBIN URINE: NEGATIVE
Glucose, UA: NEGATIVE mg/dL
HGB URINE DIPSTICK: NEGATIVE
KETONES UR: 15 mg/dL — AB
LEUKOCYTES UA: NEGATIVE
NITRITE: NEGATIVE
Protein, ur: NEGATIVE mg/dL
Specific Gravity, Urine: 1.033 — ABNORMAL HIGH (ref 1.005–1.030)
Urobilinogen, UA: 0.2 mg/dL (ref 0.0–1.0)
pH: 5.5 (ref 5.0–8.0)

## 2014-06-25 LAB — LIPASE, BLOOD: Lipase: 37 U/L (ref 11–59)

## 2014-06-25 MED ORDER — SODIUM CHLORIDE 0.9 % IV BOLUS (SEPSIS)
1000.0000 mL | Freq: Once | INTRAVENOUS | Status: AC
  Administered 2014-06-25: 1000 mL via INTRAVENOUS

## 2014-06-25 NOTE — ED Notes (Signed)
Bed: WA10 Expected date:  Expected time:  Means of arrival:  Comments: EMS-vomiting 

## 2014-06-25 NOTE — Discharge Instructions (Signed)
1. Medications: usual home medications 2. Treatment: rest, drink plenty of fluids, advance diet slowly with ice chips and then clear liquids 3. Follow Up: Please followup with your primary doctor and/or OB/GYN in 3 days for discussion of your diagnoses and further evaluation after today's visit; if you do not have a primary care doctor use the resource guide provided to find one; Please return to the ER for persistent vomiting, high fevers, abd pain or other concerns.    Viral Gastroenteritis Viral gastroenteritis is also known as stomach flu. This condition affects the stomach and intestinal tract. It can cause sudden diarrhea and vomiting. The illness typically lasts 3 to 8 days. Most people develop an immune response that eventually gets rid of the virus. While this natural response develops, the virus can make you quite ill. CAUSES  Many different viruses can cause gastroenteritis, such as rotavirus or noroviruses. You can catch one of these viruses by consuming contaminated food or water. You may also catch a virus by sharing utensils or other personal items with an infected person or by touching a contaminated surface. SYMPTOMS  The most common symptoms are diarrhea and vomiting. These problems can cause a severe loss of body fluids (dehydration) and a body salt (electrolyte) imbalance. Other symptoms may include:  Fever.  Headache.  Fatigue.  Abdominal pain. DIAGNOSIS  Your caregiver can usually diagnose viral gastroenteritis based on your symptoms and a physical exam. A stool sample may also be taken to test for the presence of viruses or other infections. TREATMENT  This illness typically goes away on its own. Treatments are aimed at rehydration. The most serious cases of viral gastroenteritis involve vomiting so severely that you are not able to keep fluids down. In these cases, fluids must be given through an intravenous line (IV). HOME CARE INSTRUCTIONS   Drink enough fluids to  keep your urine clear or pale yellow. Drink small amounts of fluids frequently and increase the amounts as tolerated.  Ask your caregiver for specific rehydration instructions.  Avoid:  Foods high in sugar.  Alcohol.  Carbonated drinks.  Tobacco.  Juice.  Caffeine drinks.  Extremely hot or cold fluids.  Fatty, greasy foods.  Too much intake of anything at one time.  Dairy products until 24 to 48 hours after diarrhea stops.  You may consume probiotics. Probiotics are active cultures of beneficial bacteria. They may lessen the amount and number of diarrheal stools in adults. Probiotics can be found in yogurt with active cultures and in supplements.  Wash your hands well to avoid spreading the virus.  Only take over-the-counter or prescription medicines for pain, discomfort, or fever as directed by your caregiver. Do not give aspirin to children. Antidiarrheal medicines are not recommended.  Ask your caregiver if you should continue to take your regular prescribed and over-the-counter medicines.  Keep all follow-up appointments as directed by your caregiver. SEEK IMMEDIATE MEDICAL CARE IF:   You are unable to keep fluids down.  You do not urinate at least once every 6 to 8 hours.  You develop shortness of breath.  You notice blood in your stool or vomit. This may look like coffee grounds.  You have abdominal pain that increases or is concentrated in one small area (localized).  You have persistent vomiting or diarrhea.  You have a fever.  The patient is a child younger than 3 months, and he or she has a fever.  The patient is a child older than 3 months, and he  or she has a fever and persistent symptoms.  The patient is a child older than 3 months, and he or she has a fever and symptoms suddenly get worse.  The patient is a baby, and he or she has no tears when crying. MAKE SURE YOU:   Understand these instructions.  Will watch your condition.  Will get  help right away if you are not doing well or get worse. Document Released: 06/07/2005 Document Revised: 08/30/2011 Document Reviewed: 03/24/2011 [facility] Patient Information 2015 [place], [place]. This information is not intended to replace advice given to you by your health care provider. Make sure you discuss any questions you have with your health care provider.

## 2014-06-25 NOTE — ED Notes (Signed)
Per EMS- Patient has had vomiting and diarrhea since yesterday. Patient works in a [employer]. Today patient has had dizziness and weakness.. Patient was given zofran 4 mg IV prior to arriving to the ED and received 100 ml NS. Patient is [redacted] Weeks pregnant.

## 2014-06-25 NOTE — ED Provider Notes (Signed)
CSN: [id number]     Arrival date & time 06/25/14  1407 History   First MD Initiated Contact with Patient 06/25/14 1502     Chief Complaint  Patient presents with  . Emesis  . Diarrhea     (Consider location/radiation/quality/duration/timing/severity/associated sxs/prior Treatment) The history is provided by the patient and medical records. No language interpreter was used.     Lori Lloyd is a 19 y.o. female  G5P1011 with a hx of asthma, eczema, depression, current pregnancy presents to the Emergency Department complaining of intermittent nausea, vomiting and diarrhea onset yesterday afternoon after being exposed to someone at work who had the same symptoms this week.  Pt denies abd pain, but reports intermittent epigastric soreness and cramping since this began. Patient reports subjective chills without fever.  she also reports associated lightheadedness and fatigue since this began.She reports no hematemesis, melena or hematochezia.  No treatments prior to arrival.  Patient reports she is [redacted] weeks pregnant but has no intention of proceeding with this pregnancy.  Eating seems to make her vomiting worse. Nothing seems to make her symptoms better. She denies headache, neck pain, chest pain, shortness of breath, abdominal pain, syncope, dysuria, hematuria, vaginal discharge, vaginal bleeding, lower abdominal pain, lower abdominal cramping.  Past Medical History  Diagnosis Date  . Asthma   . Eczema   . Fracture     ring finger right hand  . Asthma   . Depression    Past Surgical History  Procedure Laterality Date  . Wisdom tooth extraction     Family History  Problem Relation Age of Onset  . Hearing loss Neg Hx   . Diabetes Maternal Grandmother   . Hypertension Maternal Grandmother    History  Substance Use Topics  . Smoking status: Former Smoker -- 0.50 packs/day for .5 years    Types: Cigarettes    Quit date: 03/01/2012  . Smokeless tobacco: Never Used  . Alcohol Use: No      Comment: Pt currently smokes 3-4 x per week   OB History    Gravida Para Term Preterm AB TAB SAB Ectopic Multiple Living   0 1 1 0 0 0 1     Review of Systems  Constitutional: Negative for fever, diaphoresis, appetite change, fatigue and unexpected weight change.  HENT: Negative for mouth sores and trouble swallowing.   Eyes: Negative for visual disturbance.  Respiratory: Negative for cough, chest tightness, shortness of breath, wheezing and stridor.   Cardiovascular: Negative for chest pain and palpitations.  Gastrointestinal: Positive for nausea, vomiting and diarrhea. Negative for abdominal pain, constipation, blood in stool, abdominal distention and rectal pain.  Endocrine: Negative for polydipsia, polyphagia and polyuria.  Genitourinary: Negative for dysuria, urgency, frequency, hematuria, flank pain and difficulty urinating.  Musculoskeletal: Negative for back pain, neck pain and neck stiffness.  Skin: Negative for rash.  Allergic/Immunologic: Negative for immunocompromised state.  Neurological: Negative for syncope, weakness, light-headedness and headaches.  Hematological: Negative for adenopathy. Does not bruise/bleed easily.  Psychiatric/Behavioral: Negative for confusion and sleep disturbance. The patient is not nervous/anxious.   All other systems reviewed and are negative.      Allergies  Shellfish allergy  Home Medications   Prior to Admission medications   Medication Sig Start Date End Date Taking? Authorizing Provider  methocarbamol (ROBAXIN) 500 MG tablet Take 1 tablet (500 mg total) by mouth 2 (two) times daily. Patient not taking: Reported on 06/25/2014 01/30/14   Santiago Glad, PA-C  naproxen (NAPROSYN) 500 MG tablet Take 1 tablet (500 mg total) by mouth 2 (two) times daily. Patient not taking: Reported on 06/25/2014 01/30/14   Santiago GladHeather Laisure, PA-C   BP 99/54 mmHg  Pulse 107  Temp(Src) 99.4 F (37.4 C) (Oral)  Resp 18  SpO2 100%  LMP  04/28/2014 Physical Exam  Constitutional: She appears well-developed and well-nourished. No distress.  Awake, alert, nontoxic appearance  HENT:  Head: Normocephalic and atraumatic.  Mouth/Throat: Oropharynx is clear and moist. No oropharyngeal exudate.  Eyes: Conjunctivae are normal. No scleral icterus.  Neck: Normal range of motion. Neck supple.  Cardiovascular: Normal rate, regular rhythm, normal heart sounds and intact distal pulses.   No murmur heard. Pulmonary/Chest: Effort normal and breath sounds normal. No respiratory distress. She has no wheezes.  Equal chest expansion  Abdominal: Soft. Bowel sounds are normal. She exhibits no distension and no mass. There is no tenderness. There is no rebound and no guarding.  Abdomen soft and nontender without rebound or guarding No CVA tenderness  Musculoskeletal: Normal range of motion. She exhibits no edema.  Neurological: She is alert. Coordination normal.  Speech is clear and goal oriented Moves extremities without ataxia  Skin: Skin is warm and dry. She is not diaphoretic. No erythema.  Psychiatric:  Patient is tearful  Nursing note and vitals reviewed.   ED Course  Procedures (including critical care time) Labs Review Labs Reviewed  CBC WITH DIFFERENTIAL - Abnormal; Notable for the following:    Neutrophils Relative % 82 (*)    Lymphocytes Relative 11 (*)    All other components within normal limits  COMPREHENSIVE METABOLIC PANEL - Abnormal; Notable for the following:    Creatinine, Ser 0.45 (*)    All other components within normal limits  URINALYSIS, ROUTINE W REFLEX MICROSCOPIC - Abnormal; Notable for the following:    APPearance CLOUDY (*)    Specific Gravity, Urine 1.033 (*)    Ketones, ur 15 (*)    All other components within normal limits  HCG, QUANTITATIVE, PREGNANCY - Abnormal; Notable for the following:    hCG, Beta Chain, Quant, S 92309 (*)    All other components within normal limits  LIPASE, BLOOD     Imaging Review No results found.   EKG Interpretation None      MDM   Final diagnoses:  Viral gastroenteritis  Pregnant   Lori Lloyd presents with nausea, vomiting and diarrhea which she states is different from "morning sickness."  Patient exposed to the same at work.  Abdomen soft and nontender.  Record review shows the patient [facility] with ultrasound and probable intrauterine pregnancy.  Patient denying lower abdominal pain.    5:46 PM Pt is tolerating PO without further vomiting.  Patient with symptoms consistent with viral gastroenteritis.  Vitals are stable, no fever.  No signs of dehydration, tolerating PO fluids > 6 oz.  Lungs are clear.  No focal abdominal pain, no concern for appendicitis, cholecystitis, pancreatitis, ruptured viscus, UTI, kidney stone, or any other abdominal etiology.  Pt with known pregnancy.  She denies lower abdominal pain, vaginal discharge or vaginal bleeding. HCG has climbed appropriately. Patient with previous ultrasound with likely intrauterine pregnancy at this time there are no clinical signs or symptoms for ruptured ectopic. Supportive therapy indicated with return if symptoms worsen.  Patient will not be given Zofran due to her pregnancy. Patient counseled.  I have personally reviewed patient's vitals, nursing note and any pertinent labs  or imaging.  I performed an focused physical exam; undressed when appropriate .    It has been determined that no acute conditions requiring further emergency intervention are present at this time. The patient/guardian have been advised of the diagnosis and plan. I reviewed any labs and imaging including any potential incidental findings. We have discussed signs and symptoms that warrant return to the ED and they are listed in the discharge instructions.    Vital signs are stable at discharge.   BP 99/54 mmHg  Pulse 107  Temp(Src) 99.4 F (37.4 C) (Oral)  Resp 18   SpO2 100%  LMP 04/28/2014         Dahlia Client Muna Demers, PA-C 06/25/14 2346  Elwin Mocha, MD 06/25/14 [phone]

## 2014-12-12 ENCOUNTER — Inpatient Hospital Stay (HOSPITAL_COMMUNITY)
Admission: AD | Admit: 2014-12-12 | Discharge: 2014-12-12 | Disposition: A | Discharge status: Home / Self Care | Payer: Medicaid Other | Source: Ambulatory Visit | Attending: Obstetrics & Gynecology | Admitting: Obstetrics & Gynecology

## 2014-12-12 ENCOUNTER — Encounter (HOSPITAL_COMMUNITY): Payer: Self-pay | Admitting: *Deleted

## 2014-12-12 DIAGNOSIS — F17210 Nicotine dependence, cigarettes, uncomplicated: Secondary | ICD-10-CM | POA: Insufficient documentation

## 2014-12-12 DIAGNOSIS — R102 Pelvic and perineal pain: Secondary | ICD-10-CM | POA: Insufficient documentation

## 2014-12-12 DIAGNOSIS — N898 Other specified noninflammatory disorders of vagina: Secondary | ICD-10-CM

## 2014-12-12 DIAGNOSIS — R1030 Lower abdominal pain, unspecified: Secondary | ICD-10-CM

## 2014-12-12 LAB — URINALYSIS, ROUTINE W REFLEX MICROSCOPIC
Bilirubin Urine: NEGATIVE
GLUCOSE, UA: NEGATIVE mg/dL
Hgb urine dipstick: NEGATIVE
Ketones, ur: NEGATIVE mg/dL
Nitrite: NEGATIVE
PH: 6 (ref 5.0–8.0)
PROTEIN: NEGATIVE mg/dL
Specific Gravity, Urine: >1.03 — ABNORMAL HIGH (ref 1.005–1.030)
Urobilinogen, UA: 0.2 mg/dL (ref 0.0–1.0)

## 2014-12-12 LAB — WET PREP, GENITAL
Clue Cells Wet Prep HPF POC: NONE SEEN
Trich, Wet Prep: NONE SEEN
Yeast Wet Prep HPF POC: NONE SEEN

## 2014-12-12 LAB — URINE MICROSCOPIC-ADD ON

## 2014-12-12 LAB — POCT PREGNANCY, URINE: Preg Test, Ur: NEGATIVE

## 2014-12-12 MED ORDER — IBUPROFEN 600 MG PO TABS: 600.0000 mg | ORAL_TABLET | Freq: Four times a day (QID) | ORAL | Status: DC | PRN

## 2014-12-12 NOTE — MAU Provider Note (Signed)
History     CSN: [id number]  Arrival date and time: 12/12/14 0946   None     Chief Complaint  Patient presents with  . Abdominal Pain   HPI Lori Lloyd 19 y.o. [phone] nonpregnant female presents to MAU complaining of pelvic pain.  She has had mid-lower abd/pelvic pain x 4 days.  Pain is 10/10, comes and goes, described as cramps with sharp pains.  She balls up in the fetal position and spent an hour like that.  This morning is hasn't been too bad.  NO pain whatsoever now.  NO pain meds used yesterday.  Denies nausea, vomiting, fever, dysuria.  She has vaginal discharge with odor which is unusual.  She uses condoms for contraception OB History    Gravida Para Term Preterm AB TAB SAB Ectopic Multiple Living   3 1 1  0 2 2 0 0 0 1      Past Medical History  Diagnosis Date  . Asthma   . Eczema   . Fracture     ring finger right hand  . Asthma   . Depression     Past Surgical History  Procedure Laterality Date  . Wisdom tooth extraction      Family History  Problem Relation Age of Onset  . Hearing loss Neg Hx   . Diabetes Maternal Grandmother   . Hypertension Maternal Grandmother     History  Substance Use Topics  . Smoking status: Current Every Day Smoker -- 0.50 packs/day for .5 years    Types: Cigarettes    Last Attempt to Quit: 03/01/2012  . Smokeless tobacco: Never Used  . Alcohol Use: No     Comment: Pt currently smokes 3-4 x per week    Allergies:  Allergies  Allergen Reactions  . Shellfish Allergy Anaphylaxis    Prescriptions prior to admission  Medication Sig Dispense Refill Last Dose  . methocarbamol (ROBAXIN) 500 MG tablet Take 1 tablet (500 mg total) by mouth 2 (two) times daily. (Patient not taking: Reported on 06/25/2014) 20 tablet 0 Not Taking at Unknown time  . naproxen (NAPROSYN) 500 MG tablet Take 1 tablet (500 mg total) by mouth 2 (two) times daily. (Patient not taking: Reported on 06/25/2014) 30 tablet 0 Not Taking at Unknown time     ROS Pertinent ROS in HPI.  All other systems are negative.   Physical Exam   Blood pressure 90/61, pulse 82, temperature 98.3 F (36.8 C), temperature source Oral, resp. rate 18, height 5\' 1"  (1.549 m), weight 12 lb 3.2 oz (5.534 kg), last menstrual period 10/23/2014, unknown if currently breastfeeding.  Physical Exam  Constitutional: She is oriented to person, place, and time. She appears well-developed and well-nourished. No distress.  HENT:  Head: Normocephalic and atraumatic.  Eyes: EOM are normal.  Neck: Normal range of motion.  Cardiovascular: Normal rate, regular rhythm and normal heart sounds.   Respiratory: Effort normal and breath sounds normal. No respiratory distress.  GI: Soft. Bowel sounds are normal. She exhibits no distension.  Genitourinary:  Mod amt white phsyiologic discharge NO CMT.  No adnexal mass or tenderness  Musculoskeletal: Normal range of motion.  Neurological: She is alert and oriented to person, place, and time.  Skin: Skin is warm and dry.  Psychiatric: She has a normal mood and affect.   Results for orders placed or performed during the hospital encounter of 12/12/14 (from the past 24 hour(s))  Urinalysis, Routine w reflex microscopic (not at [facility])  Status: Abnormal   Collection Time: 12/12/14 10:03 AM  Result Value Ref Range   Color, Urine YELLOW YELLOW   APPearance CLEAR CLEAR   Specific Gravity, Urine >1.030 (H) 1.005 - 1.030   pH 6.0 5.0 - 8.0   Glucose, UA NEGATIVE NEGATIVE mg/dL   Hgb urine dipstick NEGATIVE NEGATIVE   Bilirubin Urine NEGATIVE NEGATIVE   Ketones, ur NEGATIVE NEGATIVE mg/dL   Protein, ur NEGATIVE NEGATIVE mg/dL   Urobilinogen, UA 0.2 0.0 - 1.0 mg/dL   Nitrite NEGATIVE NEGATIVE   Leukocytes, UA SMALL (A) NEGATIVE  Urine microscopic-add on     Status: None   Collection Time: 12/12/14 10:03 AM  Result Value Ref Range   Squamous Epithelial / LPF RARE RARE   WBC, UA 0-2 <3 WBC/hpf   Bacteria, UA RARE RARE   Pregnancy, urine POC     Status: None   Collection Time: 12/12/14 10:04 AM  Result Value Ref Range   Preg Test, Ur NEGATIVE NEGATIVE  Wet prep, genital     Status: Abnormal   Collection Time: 12/12/14 10:50 AM  Result Value Ref Range   Yeast Wet Prep HPF POC NONE SEEN NONE SEEN   Trich, Wet Prep NONE SEEN NONE SEEN   Clue Cells Wet Prep HPF POC NONE SEEN NONE SEEN   WBC, Wet Prep HPF POC MODERATE (A) NONE SEEN    MAU Course  Procedures  MDM Wet prep ordered to eval discharge.   U/A reviewed  No infection identified.   No need to treat pain which is 100% absent presently  Assessment and Plan  A: pelvic pain  P: Discharge to home Pain has improved on its own now.  May use OTC Ibuprofen should pain return. Labs [facility].   Patient may return to MAU as needed or if her condition were to change or worsen    Bertram Denver 12/12/2014, 10:42 AM

## 2014-12-12 NOTE — Discharge Instructions (Signed)
Abdominal Pain, Women °Abdominal (stomach, pelvic, or belly) pain can be caused by many things. It is important to tell your doctor: °· The location of the pain. °· Does it come and go or is it present all the time? °· Are there things that start the pain (eating certain foods, exercise)? °· Are there other symptoms associated with the pain (fever, nausea, vomiting, diarrhea)? °All of this is helpful to know when trying to find the cause of the pain. °CAUSES  °· Stomach: virus or bacteria infection, or ulcer. °· Intestine: appendicitis (inflamed appendix), regional ileitis (Crohn's disease), ulcerative colitis (inflamed colon), irritable bowel syndrome, diverticulitis (inflamed diverticulum of the colon), or cancer of the stomach or intestine. °· Gallbladder disease or stones in the gallbladder. °· Kidney disease, kidney stones, or infection. °· Pancreas infection or cancer. °· Fibromyalgia (pain disorder). °· Diseases of the female organs: °¨ Uterus: fibroid (non-cancerous) tumors or infection. °¨ Fallopian tubes: infection or tubal pregnancy. °¨ Ovary: cysts or tumors. °¨ Pelvic adhesions (scar tissue). °¨ Endometriosis (uterus lining tissue growing in the pelvis and on the pelvic organs). °¨ Pelvic congestion syndrome (female organs filling up with blood just before the menstrual period). °¨ Pain with the menstrual period. °¨ Pain with ovulation (producing an egg). °¨ Pain with an IUD (intrauterine device, birth control) in the uterus. °¨ Cancer of the female organs. °· Functional pain (pain not caused by a disease, may improve without treatment). °· Psychological pain. °· Depression. °DIAGNOSIS  °Your doctor will decide the seriousness of your pain by doing an examination. °· Blood tests. °· X-rays. °· Ultrasound. °· CT scan (computed tomography, special type of X-ray). °· MRI (magnetic resonance imaging). °· Cultures, for infection. °· Barium enema (dye inserted in the large intestine, to better view it with  X-rays). °· Colonoscopy (looking in intestine with a lighted tube). °· Laparoscopy (minor surgery, looking in abdomen with a lighted tube). °· Major abdominal exploratory surgery (looking in abdomen with a large incision). °TREATMENT  °The treatment will depend on the cause of the pain.  °· Many cases can be observed and treated at home. °· Over-the-counter medicines recommended by your caregiver. °· Prescription medicine. °· Antibiotics, for infection. °· Birth control pills, for painful periods or for ovulation pain. °· Hormone treatment, for endometriosis. °· Nerve blocking injections. °· Physical therapy. °· Antidepressants. °· Counseling with a psychologist or psychiatrist. °· Minor or major surgery. °HOME CARE INSTRUCTIONS  °· Do not take laxatives, unless directed by your caregiver. °· Take over-the-counter pain medicine only if ordered by your caregiver. Do not take aspirin because it can cause an upset stomach or bleeding. °· Try a clear liquid diet (broth or water) as ordered by your caregiver. Slowly move to a bland diet, as tolerated, if the pain is related to the stomach or intestine. °· Have a thermometer and take your temperature several times a day, and record it. °· Bed rest and sleep, if it helps the pain. °· Avoid sexual intercourse, if it causes pain. °· Avoid stressful situations. °· Keep your follow-up appointments and tests, as your caregiver orders. °· If the pain does not go away with medicine or surgery, you may try: °¨ Acupuncture. °¨ Relaxation exercises (yoga, meditation). °¨ Group therapy. °¨ Counseling. °SEEK MEDICAL CARE IF:  °· You notice certain foods cause stomach pain. °· Your home care treatment is not helping your pain. °· You need stronger pain medicine. °· You want your IUD removed. °· You feel faint or   lightheaded. °· You develop nausea and vomiting. °· You develop a rash. °· You are having side effects or an allergy to your medicine. °SEEK IMMEDIATE MEDICAL CARE IF:  °· Your  pain does not go away or gets worse. °· You have a fever. °· Your pain is felt only in portions of the abdomen. The right side could possibly be appendicitis. The left lower portion of the abdomen could be colitis or diverticulitis. °· You are passing blood in your stools (bright red or black tarry stools, with or without vomiting). °· You have blood in your urine. °· You develop chills, with or without a fever. °· You pass out. °MAKE SURE YOU:  °· Understand these instructions. °· Will watch your condition. °· Will get help right away if you are not doing well or get worse. °Document Released: 04/04/2007 Document Revised: 10/22/2013 Document Reviewed: 04/24/2009 °[employer]® Patient Information ©2015 [employer]. This information is not intended to replace advice given to you by your health care provider. Make sure you discuss any questions you have with your health care provider. ° °

## 2014-12-12 NOTE — MAU Note (Signed)
Pt c/o abd pain for the past 2-3 days. Denies N/v . Had some diarrhea yesterday.

## 2014-12-13 LAB — HIV ANTIBODY (ROUTINE TESTING W REFLEX): HIV SCREEN 4TH GENERATION: NONREACTIVE

## 2014-12-13 LAB — GC/CHLAMYDIA PROBE AMP (~~LOC~~) NOT AT ARMC
CHLAMYDIA, DNA PROBE: NEGATIVE
Neisseria Gonorrhea: NEGATIVE

## 2015-04-23 ENCOUNTER — Emergency Department (HOSPITAL_COMMUNITY)
Admission: EM | Admit: 2015-04-23 | Discharge: 2015-04-23 | Disposition: A | Discharge status: Home / Self Care | Payer: Medicaid Other | Attending: Emergency Medicine | Admitting: Emergency Medicine

## 2015-04-23 ENCOUNTER — Encounter (HOSPITAL_COMMUNITY): Payer: Self-pay | Admitting: Emergency Medicine

## 2015-04-23 DIAGNOSIS — Z113 Encounter for screening for infections with a predominantly sexual mode of transmission: Secondary | ICD-10-CM

## 2015-04-23 DIAGNOSIS — Z872 Personal history of diseases of the skin and subcutaneous tissue: Secondary | ICD-10-CM | POA: Insufficient documentation

## 2015-04-23 DIAGNOSIS — Z3202 Encounter for pregnancy test, result negative: Secondary | ICD-10-CM | POA: Insufficient documentation

## 2015-04-23 DIAGNOSIS — Z8781 Personal history of (healed) traumatic fracture: Secondary | ICD-10-CM | POA: Insufficient documentation

## 2015-04-23 DIAGNOSIS — B9689 Other specified bacterial agents as the cause of diseases classified elsewhere: Secondary | ICD-10-CM

## 2015-04-23 DIAGNOSIS — Z720 Tobacco use: Secondary | ICD-10-CM | POA: Insufficient documentation

## 2015-04-23 DIAGNOSIS — Z202 Contact with and (suspected) exposure to infections with a predominantly sexual mode of transmission: Secondary | ICD-10-CM | POA: Insufficient documentation

## 2015-04-23 DIAGNOSIS — J45909 Unspecified asthma, uncomplicated: Secondary | ICD-10-CM | POA: Insufficient documentation

## 2015-04-23 DIAGNOSIS — N760 Acute vaginitis: Secondary | ICD-10-CM | POA: Insufficient documentation

## 2015-04-23 DIAGNOSIS — Z8659 Personal history of other mental and behavioral disorders: Secondary | ICD-10-CM | POA: Insufficient documentation

## 2015-04-23 LAB — WET PREP, GENITAL
TRICH WET PREP: NONE SEEN
YEAST WET PREP: NONE SEEN

## 2015-04-23 LAB — POC URINE PREG, ED: PREG TEST UR: NEGATIVE

## 2015-04-23 MED ORDER — CEFTRIAXONE SODIUM 250 MG IJ SOLR
250.0000 mg | Freq: Once | INTRAMUSCULAR | Status: AC
  Administered 2015-04-23: 250 mg via INTRAMUSCULAR
  Filled 2015-04-23: qty 250

## 2015-04-23 MED ORDER — LIDOCAINE HCL (PF) 1 % IJ SOLN
0.9000 mL | Freq: Once | INTRAMUSCULAR | Status: AC
  Administered 2015-04-23: 0.9 mL

## 2015-04-23 MED ORDER — LIDOCAINE HCL (PF) 1 % IJ SOLN
INTRAMUSCULAR | Status: AC
  Administered 2015-04-23: 0.9 mL
  Filled 2015-04-23: qty 5

## 2015-04-23 MED ORDER — AZITHROMYCIN 250 MG PO TABS
1000.0000 mg | ORAL_TABLET | Freq: Once | ORAL | Status: AC
  Administered 2015-04-23: 1000 mg via ORAL
  Filled 2015-04-23: qty 4

## 2015-04-23 MED ORDER — METRONIDAZOLE 500 MG PO TABS: 500.0000 mg | ORAL_TABLET | Freq: Two times a day (BID) | ORAL | Status: DC
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
## 2015-04-23 NOTE — ED Notes (Signed)
Pelvic cart set up outside of room. 

## 2015-04-23 NOTE — Discharge Instructions (Signed)
Bacterial Vaginosis °Bacterial vaginosis is a vaginal infection that occurs when the normal balance of bacteria in the vagina is disrupted. It results from an overgrowth of certain bacteria. This is the most common vaginal infection in women of childbearing age. Treatment is important to prevent complications, especially in pregnant women, as it can cause a premature delivery. °CAUSES  °Bacterial vaginosis is caused by an increase in harmful bacteria that are normally present in smaller amounts in the vagina. Several different kinds of bacteria can cause bacterial vaginosis. However, the reason that the condition develops is not fully understood. °RISK FACTORS °Certain activities or behaviors can put you at an increased risk of developing bacterial vaginosis, including: °· Having a new sex partner or multiple sex partners. °· Douching. °· Using an intrauterine device (IUD) for contraception. °Women do not get bacterial vaginosis from toilet seats, bedding, swimming pools, or contact with objects around them. °SIGNS AND SYMPTOMS  °Some women with bacterial vaginosis have no signs or symptoms. Common symptoms include: °· Grey vaginal discharge. °· A fishlike odor with discharge, especially after sexual intercourse. °· Itching or burning of the vagina and vulva. °· Burning or pain with urination. °DIAGNOSIS  °Your health care provider will take a medical history and examine the vagina for signs of bacterial vaginosis. A sample of vaginal fluid may be taken. Your health care provider will look at this sample under a microscope to check for bacteria and abnormal cells. A vaginal pH test may also be done.  °TREATMENT  °Bacterial vaginosis may be treated with antibiotic medicines. These may be given in the form of a pill or a vaginal cream. A second round of antibiotics may be prescribed if the condition comes back after treatment. Because bacterial vaginosis increases your risk for sexually transmitted diseases, getting  treated can help reduce your risk for chlamydia, gonorrhea, HIV, and herpes. °HOME CARE INSTRUCTIONS  °· Only take over-the-counter or prescription medicines as directed by your health care provider. °· If antibiotic medicine was prescribed, take it as directed. Make sure you finish it even if you start to feel better. °· Tell all sexual partners that you have a vaginal infection. They should see their health care provider and be treated if they have problems, such as a mild rash or itching. °· During treatment, it is important that you follow these instructions: °· Avoid sexual activity or use condoms correctly. °· Do not douche. °· Avoid alcohol as directed by your health care provider. °· Avoid breastfeeding as directed by your health care provider. °SEEK MEDICAL CARE IF:  °· Your symptoms are not improving after 3 days of treatment. °· You have increased discharge or pain. °· You have a fever. °MAKE SURE YOU:  °· Understand these instructions. °· Will watch your condition. °· Will get help right away if you are not doing well or get worse. °FOR MORE INFORMATION  °Centers for Disease Control and Prevention, Division of STD Prevention: www.cdc.gov/std °American Sexual Health Association (ASHA): www.ashastd.org  °  °This information is not intended to replace advice given to you by your health care provider. Make sure you discuss any questions you have with your health care provider. °  °Document Released: 06/07/2005 Document Revised: 06/28/2014 Document Reviewed: 01/17/2013 °Elsevier Interactive Patient Education ©2016 [employer] Inc. ° °Sexually Transmitted Disease °A sexually transmitted disease (STD) is a disease or infection that may be passed (transmitted) from person to person, usually during sexual activity. This may happen by way of saliva, semen, blood, vaginal   mucus, or urine. Common STDs include: °· Gonorrhea. °· Chlamydia. °· Syphilis. °· HIV and AIDS. °· Genital herpes. °· Hepatitis B and  C. °· Trichomonas. °· Human papillomavirus (HPV). °· Pubic lice. °· Scabies. °· Mites. °· Bacterial vaginosis. °WHAT ARE CAUSES OF STDs? °An STD may be caused by bacteria, a virus, or parasites. STDs are often transmitted during sexual activity if one person is infected. However, they may also be transmitted through nonsexual means. STDs may be transmitted after:  °· Sexual intercourse with an infected person. °· Sharing sex toys with an infected person. °· Sharing needles with an infected person or using unclean piercing or tattoo needles. °· Having intimate contact with the genitals, mouth, or rectal areas of an infected person. °· Exposure to infected fluids during birth. °WHAT ARE THE SIGNS AND SYMPTOMS OF STDs? °Different STDs have different symptoms. Some people may not have any symptoms. If symptoms are present, they may include: °· Painful or bloody urination. °· Pain in the pelvis, abdomen, vagina, anus, throat, or eyes. °· A skin rash, itching, or irritation. °· Growths, ulcerations, blisters, or sores in the genital and anal areas. °· Abnormal vaginal discharge with or without bad odor. °· Penile discharge in men. °· Fever. °· Pain or bleeding during sexual intercourse. °· Swollen glands in the groin area. °· Yellow skin and eyes (jaundice). This is seen with hepatitis. °· Swollen testicles. °· Infertility. °· Sores and blisters in the mouth. °HOW ARE STDs DIAGNOSED? °To make a diagnosis, your health care provider may: °· Take a medical history. °· Perform a physical exam. °· Take a sample of any discharge to examine. °· Swab the throat, cervix, opening to the penis, rectum, or vagina for testing. °· Test a sample of your first morning urine. °· Perform blood tests. °· Perform a Pap test, if this applies. °· Perform a colposcopy. °· Perform a laparoscopy. °HOW ARE STDs TREATED? °Treatment depends on the STD. Some STDs may be treated but not cured. °· Chlamydia, gonorrhea, trichomonas, and syphilis can be  cured with antibiotic medicine. °· Genital herpes, hepatitis, and HIV can be treated, but not cured, with prescribed medicines. The medicines lessen symptoms. °· Genital warts from HPV can be treated with medicine or by freezing, burning (electrocautery), or surgery. Warts may come back. °· HPV cannot be cured with medicine or surgery. However, abnormal areas may be removed from the cervix, vagina, or vulva. °· If your diagnosis is confirmed, your recent sexual partners need treatment. This is true even if they are symptom-free or have a negative culture or evaluation. They should not have sex until their health care providers say it is okay. °· Your health care provider may test you for infection again 3 months after treatment. °HOW CAN I REDUCE MY RISK OF GETTING AN STD? °Take these steps to reduce your risk of getting an STD: °· Use latex condoms, dental dams, and water-soluble lubricants during sexual activity. Do not use petroleum jelly or oils. °· Avoid having multiple sex partners. °· Do not have sex with someone who has other sex partners °· Do not have sex with anyone you do not know or who is at high risk for an STD. °· Avoid risky sex practices that can break your skin. °· Do not have sex if you have open sores on your mouth or skin. °· Avoid drinking too much alcohol or taking illegal drugs. Alcohol and drugs can affect your judgment and put you in a vulnerable position. °·   Avoid engaging in oral and anal sex acts. °· Get vaccinated for HPV and hepatitis. If you have not received these vaccines in the past, talk to your health care provider about whether one or both might be right for you. °· If you are at risk of being infected with HIV, it is recommended that you take a prescription medicine daily to prevent HIV infection. This is called pre-exposure prophylaxis (PrEP). You are considered at risk if: °¨ You are a man who has sex with other men (MSM). °¨ You are a heterosexual man or woman and are  sexually active with more than one partner. °¨ You take drugs by injection. °¨ You are sexually active with a partner who has HIV. °· Talk with your health care provider about whether you are at high risk of being infected with HIV. If you choose to begin PrEP, you should first be tested for HIV. You should then be tested every 3 months for as long as you are taking PrEP. °WHAT SHOULD I DO IF I THINK I HAVE AN STD? °· See your health care provider. °· Tell your sexual partner(s). They should be tested and treated for any STDs. °· Do not have sex until your health care provider says it is okay. °WHEN SHOULD I GET IMMEDIATE MEDICAL CARE? °Contact your health care provider right away if:  °· You have severe abdominal pain. °· You are a man and notice swelling or pain in your testicles. °· You are a woman and notice swelling or pain in your vagina. °  °This information is not intended to replace advice given to you by your health care provider. Make sure you discuss any questions you have with your health care provider. °  °Document Released: 08/28/2002 Document Revised: 06/28/2014 Document Reviewed: 12/26/2012 °Elsevier Interactive Patient Education ©2016 Elsevier Inc. ° °

## 2015-04-24 LAB — HIV ANTIBODY (ROUTINE TESTING W REFLEX): HIV SCREEN 4TH GENERATION: NONREACTIVE

## 2015-04-25 LAB — GC/CHLAMYDIA PROBE AMP (~~LOC~~) NOT AT ARMC
Chlamydia: NEGATIVE
NEISSERIA GONORRHEA: NEGATIVE

## 2015-04-26 ENCOUNTER — Telehealth (HOSPITAL_COMMUNITY): Payer: Self-pay

## 2015-08-19 ENCOUNTER — Encounter (HOSPITAL_COMMUNITY): Payer: Self-pay | Admitting: Emergency Medicine

## 2015-08-19 ENCOUNTER — Emergency Department (HOSPITAL_COMMUNITY)
Admission: EM | Admit: 2015-08-19 | Discharge: 2015-08-19 | Disposition: A | Discharge status: Home / Self Care | Payer: Medicaid Other | Attending: Emergency Medicine | Admitting: Emergency Medicine

## 2015-08-19 DIAGNOSIS — J45909 Unspecified asthma, uncomplicated: Secondary | ICD-10-CM | POA: Insufficient documentation

## 2015-08-19 DIAGNOSIS — B349 Viral infection, unspecified: Secondary | ICD-10-CM | POA: Insufficient documentation

## 2015-08-19 DIAGNOSIS — Z872 Personal history of diseases of the skin and subcutaneous tissue: Secondary | ICD-10-CM | POA: Insufficient documentation

## 2015-08-19 DIAGNOSIS — Z8781 Personal history of (healed) traumatic fracture: Secondary | ICD-10-CM | POA: Insufficient documentation

## 2015-08-19 DIAGNOSIS — Z8659 Personal history of other mental and behavioral disorders: Secondary | ICD-10-CM | POA: Insufficient documentation

## 2015-08-19 DIAGNOSIS — F17210 Nicotine dependence, cigarettes, uncomplicated: Secondary | ICD-10-CM | POA: Insufficient documentation

## 2015-08-19 DIAGNOSIS — Z792 Long term (current) use of antibiotics: Secondary | ICD-10-CM | POA: Insufficient documentation

## 2015-08-19 MED ORDER — BENZONATATE 100 MG PO CAPS: 100.0000 mg | ORAL_CAPSULE | Freq: Three times a day (TID) | ORAL | Status: DC

## 2015-08-19 NOTE — ED Provider Notes (Signed)
CSN: [id number]     Arrival date & time 08/19/15  1244 History  By signing my name below, I, Lori Lloyd, attest that this documentation has been prepared under the direction and in the presence of [employer]. Electronically Signed: Bethel Lloyd, ED Scribe. 08/19/2015 2:43 PM   Chief Complaint  Patient presents with  . Cough  . Headache   The history is provided by the patient. No language interpreter was used.   Lori Lloyd is a 20 y.o. female with history of asthma who presents to the Emergency Department complaining of a new dry cough with onset yesterday. Associated symptoms include fatigue, chills, a diffuse "thumping" headache, sore throat, and chest congestion. Pt denies rhinorrhea, chest pain, SOB, nausea, vomiting, diarrhea, neck pain, and loss of strength or sensation in the extremities. She is concerned for influenza as her brother was recently diagnosed with it. Pt states that he is otherwise healthy and no longer needs medication for asthma.    Past Medical History  Diagnosis Date  . Asthma   . Eczema   . Fracture     ring finger right hand  . Asthma   . Depression    Past Surgical History  Procedure Laterality Date  . Wisdom tooth extraction     Family History  Problem Relation Age of Onset  . Hearing loss Neg Hx   . Diabetes Maternal Grandmother   . Hypertension Maternal Grandmother    Social History  Substance Use Topics  . Smoking status: Current Every Day Smoker -- 0.50 packs/day for .5 years    Types: Cigarettes    Last Attempt to Quit: 03/01/2012  . Smokeless tobacco: Never Used  . Alcohol Use: No     Comment: Pt currently smokes 3-4 x per week   OB History    Gravida Para Term Preterm AB TAB SAB Ectopic Multiple Living   0 2 2 0 0 0 1     Review of Systems  All other systems reviewed and are negative.  Allergies  Shellfish allergy  Home Medications   Prior to Admission medications   Medication Sig Start Date End  Date Taking? Authorizing Provider  benzonatate (TESSALON) 100 MG capsule Take 1 capsule (100 mg total) by mouth every 8 (eight) hours. 08/19/15   Eyvonne Mechanic, PA-C  ibuprofen (ADVIL,MOTRIN) 600 MG tablet Take 1 tablet (600 mg total) by mouth every 6 (six) hours as needed. Patient not taking: Reported on 04/23/2015 12/12/14   Bertram Denver, PA-C  metroNIDAZOLE (FLAGYL) 500 MG tablet Take 1 tablet (500 mg total) by mouth 2 (two) times daily. 04/23/15   Roxy Horseman, PA-C   BP 107/67 mmHg  Pulse 99  Temp(Src) 98 F (36.7 C) (Oral)  Resp 18  SpO2 99%  Physical Exam  Constitutional: She is oriented to person, place, and time. She appears well-developed and well-nourished. No distress.  HENT:  Head: Normocephalic and atraumatic.  Right Ear: Tympanic membrane normal.  Left Ear: Tympanic membrane normal.  Nose: Nose normal.  Mouth/Throat: Uvula is midline, oropharynx is clear and moist and mucous membranes are normal.  Eyes: Conjunctivae and EOM are normal.  Neck: Normal range of motion. Neck supple. No tracheal deviation present.  Full active ROM  Cardiovascular: Normal rate and regular rhythm.   Pulmonary/Chest: Effort normal and breath sounds normal. No respiratory distress. She has no wheezes. She has no rales.  Abdominal: Soft. Bowel sounds are normal. She exhibits no mass. There  is no tenderness.  Musculoskeletal: Normal range of motion. She exhibits no edema.  Radial and pedal pulses strong, adequate circulation, good touch sensation.  Neurological: She is alert and oriented to person, place, and time. She has normal strength. No cranial nerve deficit or sensory deficit. She displays a negative Romberg sign. Gait normal.  Reflex Scores:      Bicep reflexes are 2+ on the right side and 2+ on the left side.      Brachioradialis reflexes are 2+ on the right side and 2+ on the left side.      Patellar reflexes are 2+ on the right side and 2+ on the left side.      Achilles  reflexes are 2+ on the right side and 2+ on the left side. Rapid alternating movement without difficulty. Stands on one foot without difficulty.  Skin: Skin is warm and dry.  Psychiatric: She has a normal mood and affect. Her behavior is normal.  Nursing note and vitals reviewed.   ED Course  Procedures (including critical care time) DIAGNOSTIC STUDIES: Oxygen Saturation is 99% on RA,  normal by my interpretation.    COORDINATION OF CARE: 2:21 PM Discussed treatment plan which includes discharge with symptomatic care with pt at bedside and pt agreed to plan.  Labs Review Labs Reviewed - No data to display  Imaging Review No results found.    EKG Interpretation None      MDM   Final diagnoses:  Viral illness   Labs:  Imaging:  Consults:  Therapeutics:  Discharge Meds: Tessalon  Assessment/Plan: 20 year old female presents today with likely viral illness. Patient is afebrile, nontoxic with reassuring vital signs in no acute distress. She is otherwise healthy young female with no acute findings on my exam. This could likely be influenza, she'll be instructed to rest, drink plenty fluids, follow-up with primary care in 3 days for reevaluation of symptoms persist, return to the ED if they worsen. Patient verbalized understanding and agreement for today's plan and had no further questions or concerns at the time of discharge   I personally performed the services described in this documentation, which was scribed in my presence. The recorded information has been reviewed and is accurate.    Eyvonne Mechanic, PA-C 08/19/15 1446  Mancel Bale, MD 08/19/15 [phone]

## 2015-08-19 NOTE — ED Notes (Addendum)
Pt states she woke up coughing, has a headache, and is feeling cold. States her brother recently had the flu. No fever in triage, and did not take any medication before coming today. Lungs clear in all fields.

## 2015-08-19 NOTE — Discharge Instructions (Signed)
Viral Infections °A viral infection can be caused by different types of viruses. Most viral infections are not serious and resolve on their own. However, some infections may cause severe symptoms and may lead to further complications. °SYMPTOMS °Viruses can frequently cause: °· Minor sore throat. °· Aches and pains. °· Headaches. °· Runny nose. °· Different types of rashes. °· Watery eyes. °· Tiredness. °· Cough. °· Loss of appetite. °· Gastrointestinal infections, resulting in nausea, vomiting, and diarrhea. °These symptoms do not respond to antibiotics because the infection is not caused by bacteria. However, you might catch a bacterial infection following the viral infection. This is sometimes called a "superinfection." Symptoms of such a bacterial infection may include: °· Worsening sore throat with pus and difficulty swallowing. °· Swollen neck glands. °· Chills and a high or persistent fever. °· Severe headache. °· Tenderness over the sinuses. °· Persistent overall ill feeling (malaise), muscle aches, and tiredness (fatigue). °· Persistent cough. °· Yellow, green, or brown mucus production with coughing. °HOME CARE INSTRUCTIONS  °· Only take over-the-counter or prescription medicines for pain, discomfort, diarrhea, or fever as directed by your caregiver. °· Drink enough water and fluids to keep your urine clear or pale yellow. Sports drinks can provide valuable electrolytes, sugars, and hydration. °· Get plenty of rest and maintain proper nutrition. Soups and broths with crackers or rice are fine. °SEEK IMMEDIATE MEDICAL CARE IF:  °· You have severe headaches, shortness of breath, chest pain, neck pain, or an unusual rash. °· You have uncontrolled vomiting, diarrhea, or you are unable to keep down fluids. °· You or your child has an oral temperature above 102° F (38.9° C), not controlled by medicine. °· Your baby is older than 3 months with a rectal temperature of 102° F (38.9° C) or higher. °· Your baby is 3  months old or younger with a rectal temperature of 100.4° F (38° C) or higher. °MAKE SURE YOU:  °· Understand these instructions. °· Will watch your condition. °· Will get help right away if you are not doing well or get worse. °  °This information is not intended to replace advice given to you by your health care provider. Make sure you discuss any questions you have with your health care provider. °  °Document Released: 03/17/2005 Document Revised: 08/30/2011 Document Reviewed: 11/13/2014 °Elsevier Interactive Patient Education ©2016 Elsevier Inc. ° °

## 2015-08-22 ENCOUNTER — Encounter (HOSPITAL_COMMUNITY): Payer: Self-pay | Admitting: Emergency Medicine

## 2015-08-22 ENCOUNTER — Emergency Department (HOSPITAL_COMMUNITY)
Admission: EM | Admit: 2015-08-22 | Discharge: 2015-08-22 | Disposition: A | Discharge status: Home / Self Care | Payer: Medicaid Other | Attending: Emergency Medicine | Admitting: Emergency Medicine

## 2015-08-22 DIAGNOSIS — R0602 Shortness of breath: Secondary | ICD-10-CM | POA: Insufficient documentation

## 2015-08-22 DIAGNOSIS — R509 Fever, unspecified: Secondary | ICD-10-CM | POA: Insufficient documentation

## 2015-08-22 DIAGNOSIS — J45909 Unspecified asthma, uncomplicated: Secondary | ICD-10-CM | POA: Insufficient documentation

## 2015-08-22 DIAGNOSIS — Z5321 Procedure and treatment not carried out due to patient leaving prior to being seen by health care provider: Secondary | ICD-10-CM

## 2015-08-22 DIAGNOSIS — I10 Essential (primary) hypertension: Secondary | ICD-10-CM | POA: Insufficient documentation

## 2015-08-22 DIAGNOSIS — F17210 Nicotine dependence, cigarettes, uncomplicated: Secondary | ICD-10-CM | POA: Insufficient documentation

## 2015-08-22 NOTE — ED Provider Notes (Signed)
Patient left without being seen  1. Patient left without being seen      Melene Planan Leviathan Macera, DO 08/22/15 2237

## 2015-08-22 NOTE — ED Notes (Signed)
No answer from pt in lobby x 1. 

## 2015-08-22 NOTE — ED Notes (Signed)
No answer from pt in lobby x 2. 

## 2015-08-22 NOTE — ED Notes (Signed)
Pt states that she was seen on 2/28 for flu and since then has felt worse.  States she has been having fevers.  Hypotensive in triage.  89/62.  Pt states that her child is now ill.  No vomiting or diarrhea.  States she feels SOB on exertion and cough is dry but "feels like she needs to cough something up".

## 2015-08-22 NOTE — ED Notes (Signed)
No call from pt in lobby x 3.

## 2015-08-24 ENCOUNTER — Emergency Department (HOSPITAL_COMMUNITY)
Admission: EM | Admit: 2015-08-24 | Discharge: 2015-08-24 | Disposition: A | Discharge status: Home / Self Care | Payer: Self-pay | Attending: Emergency Medicine | Admitting: Emergency Medicine

## 2015-08-24 ENCOUNTER — Encounter (HOSPITAL_COMMUNITY): Payer: Self-pay | Admitting: Emergency Medicine

## 2015-08-24 ENCOUNTER — Emergency Department (HOSPITAL_COMMUNITY): Payer: Self-pay

## 2015-08-24 DIAGNOSIS — J189 Pneumonia, unspecified organism: Secondary | ICD-10-CM

## 2015-08-24 DIAGNOSIS — J159 Unspecified bacterial pneumonia: Secondary | ICD-10-CM | POA: Insufficient documentation

## 2015-08-24 DIAGNOSIS — J45909 Unspecified asthma, uncomplicated: Secondary | ICD-10-CM | POA: Insufficient documentation

## 2015-08-24 DIAGNOSIS — R110 Nausea: Secondary | ICD-10-CM | POA: Insufficient documentation

## 2015-08-24 DIAGNOSIS — Z8659 Personal history of other mental and behavioral disorders: Secondary | ICD-10-CM | POA: Insufficient documentation

## 2015-08-24 DIAGNOSIS — R51 Headache: Secondary | ICD-10-CM | POA: Insufficient documentation

## 2015-08-24 DIAGNOSIS — R05 Cough: Secondary | ICD-10-CM

## 2015-08-24 DIAGNOSIS — R059 Cough, unspecified: Secondary | ICD-10-CM

## 2015-08-24 DIAGNOSIS — R630 Anorexia: Secondary | ICD-10-CM | POA: Insufficient documentation

## 2015-08-24 DIAGNOSIS — Z8781 Personal history of (healed) traumatic fracture: Secondary | ICD-10-CM | POA: Insufficient documentation

## 2015-08-24 DIAGNOSIS — Z872 Personal history of diseases of the skin and subcutaneous tissue: Secondary | ICD-10-CM | POA: Insufficient documentation

## 2015-08-24 DIAGNOSIS — F17210 Nicotine dependence, cigarettes, uncomplicated: Secondary | ICD-10-CM | POA: Insufficient documentation

## 2015-08-24 MED ORDER — LEVOFLOXACIN 750 MG PO TABS: 750.0000 mg | ORAL_TABLET | Freq: Every day | ORAL | Status: DC

## 2015-08-24 MED ORDER — ONDANSETRON 4 MG PO TBDP
4.0000 mg | ORAL_TABLET | Freq: Once | ORAL | Status: AC
  Administered 2015-08-24: 4 mg via ORAL
  Filled 2015-08-24: qty 1

## 2015-08-24 MED ORDER — LEVOFLOXACIN 750 MG PO TABS
750.0000 mg | ORAL_TABLET | Freq: Once | ORAL | Status: AC
  Administered 2015-08-24: 750 mg via ORAL
  Filled 2015-08-24: qty 1

## 2015-08-24 MED ORDER — ONDANSETRON HCL 4 MG PO TABS: 4.0000 mg | ORAL_TABLET | Freq: Three times a day (TID) | ORAL | Status: DC | PRN

## 2015-08-24 MED ORDER — ACETAMINOPHEN 325 MG PO TABS
650.0000 mg | ORAL_TABLET | Freq: Once | ORAL | Status: AC
  Administered 2015-08-24: 650 mg via ORAL
  Filled 2015-08-24: qty 2

## 2015-08-24 NOTE — ED Notes (Signed)
Pt states for the last 2 days she has been having full body aches with chills and cough. Pt states she has felt nausea but denies vomiting. Pt states brother recently diagnosed with FLU.

## 2015-08-24 NOTE — Discharge Instructions (Signed)
Take your antibiotic as directed. You have received today's dose here in the ED, therefore you can start taking this medication tomorrow. Use Tylenol and/or Motrin for fever as needed. Increase hydration. Call the clinic listed to schedule a follow-up appointment, or use the number provided to aid in finding a primary physician. You may return to the emergency department if symptoms worsen, become progressive, or become more concerning.  Pneumonia, Adult Pneumonia is an infection of the lungs.   CAUSES Pneumonia may be caused by bacteria or a virus. Usually, these infections are caused by breathing infectious particles into the lungs (respiratory tract).  SYMPTOMS   Cough.   Fever.   Chest pain.   Increased rate of breathing.   Wheezing.   Mucus production.   DIAGNOSIS  If you have the common symptoms of pneumonia, your caregiver will typically confirm the diagnosis with a chest X-ray. The X-ray will show an abnormality in the lung (pulmonary infiltrate) if you have pneumonia. Other tests of your blood, urine, or sputum may be done to find the specific cause of your pneumonia. Your caregiver may also do tests (blood gases or pulse oximetry) to see how well your lungs are working.  TREATMENT  Some forms of pneumonia may be spread to other people when you cough or sneeze. You may be asked to wear a mask before and during your exam. Pneumonia that is caused by bacteria is treated with antibiotic medicine. Pneumonia that is caused by the influenza virus may be treated with an antiviral medicine. Most other viral infections must run their course. These infections will not respond to antibiotics.   PREVENTION A pneumococcal shot (vaccine) is available to prevent a common bacterial cause of pneumonia. This is usually suggested for:  People over 22 years old.   Patients on chemotherapy.   People with chronic lung problems, such as bronchitis or emphysema.   People with immune system  problems.  If you are over 65 or have a high risk condition, you may receive the pneumococcal vaccine if you have not received it before. In some countries, a routine influenza vaccine is also recommended. This vaccine can help prevent some cases of pneumonia.You may be offered the influenza vaccine as part of your care. If you smoke, it is time to quit. You may receive instructions on how to stop smoking. Your caregiver can provide medicines and counseling to help you quit.  HOME CARE INSTRUCTIONS   Cough suppressants may be used if you are losing too much rest. However, coughing protects you by clearing your lungs. You should avoid using cough suppressants if you can.   Your caregiver may have prescribed medicine if he or she thinks your pneumonia is caused by a bacteria or influenza. Finish your medicine even if you start to feel better.   Your caregiver may also prescribe an expectorant. This loosens the mucus to be coughed up.   Only take over-the-counter or prescription medicines for pain, discomfort, or fever as directed by your caregiver.   Do not smoke. Smoking is a common cause of bronchitis and can contribute to pneumonia. If you are a smoker and continue to smoke, your cough may last several weeks after your pneumonia has cleared.   A cold steam vaporizer or humidifier in your room or home may help loosen mucus.   Coughing is often worse at night. Sleeping in a semi-upright position in a recliner or using a couple pillows under your head will help with this.  Get rest as you feel it is needed. Your body will usually let you know when you need to rest.   SEEK IMMEDIATE MEDICAL CARE IF:   Your illness becomes worse. This is especially true if you are elderly or weakened from any other disease.   You cannot control your cough with suppressants and are losing sleep.   You begin coughing up blood.   You develop pain which is getting worse or is uncontrolled with medicines.    You have a fever.   Any of the symptoms which initially brought you in for treatment are getting worse rather than better.   You develop shortness of breath or chest pain.   MAKE SURE YOU:   Understand these instructions.   Will watch your condition.   Will get help right away if you are not doing well or get worse.

## 2015-08-24 NOTE — ED Provider Notes (Signed)
CSN: [id number]     Arrival date & time 08/24/15  1210 History   First MD Initiated Contact with Patient 08/24/15 1312     Chief Complaint  Patient presents with  . Chills  . Croup     (Consider location/radiation/quality/duration/timing/severity/associated sxs/prior Treatment) The history is provided by the patient and medical records. No language interpreter was used.   Lori Lloyd is a 20 y.o. female  with a PMH of asthma who presents to the Emergency Department complaining of worsening body aches and productive cough since Sunday. Associated symptoms include nausea, chest tightness, nasal congestion, and subjective fever. Patient denies vomiting. No medication taken prior to arrival. No aggravating or alleviating factors noted. Patient states brother diagnosed with flu 3 weeks ago - denies other sick contacts.  Past Medical History  Diagnosis Date  . Asthma   . Eczema   . Fracture     ring finger right hand  . Asthma   . Depression    Past Surgical History  Procedure Laterality Date  . Wisdom tooth extraction     Family History  Problem Relation Age of Onset  . Hearing loss Neg Hx   . Diabetes Maternal Grandmother   . Hypertension Maternal Grandmother    Social History  Substance Use Topics  . Smoking status: Current Every Day Smoker -- 0.50 packs/day for .5 years    Types: Cigarettes    Last Attempt to Quit: 03/01/2012  . Smokeless tobacco: Never Used  . Alcohol Use: No     Comment: Pt currently smokes 3-4 x per week   OB History    Gravida Para Term Preterm AB TAB SAB Ectopic Multiple Living   0 2 2 0 0 0 1     Review of Systems  Constitutional: Positive for fever and appetite change (diminished).  HENT: Positive for congestion.   Gastrointestinal: Positive for nausea. Negative for abdominal pain and diarrhea.  Musculoskeletal: Negative for neck pain and neck stiffness.  Neurological: Positive for headaches.      Allergies  Shellfish  allergy  Home Medications   Prior to Admission medications   Medication Sig Start Date End Date Taking? Authorizing Provider  benzonatate (TESSALON) 100 MG capsule Take 1 capsule (100 mg total) by mouth every 8 (eight) hours. Patient not taking: Reported on 08/22/2015 08/19/15   Eyvonne Mechanic, PA-C  Chlorphen-Pseudoephed-APAP [facility] FLU/COLD PO) Take 1 each by mouth every 6 (six) hours as needed (cold/flu symptoms).    Historical Provider, MD  ibuprofen (ADVIL,MOTRIN) 600 MG tablet Take 1 tablet (600 mg total) by mouth every 6 (six) hours as needed. Patient not taking: Reported on 04/23/2015 12/12/14   Scot Jun Teague Clark, PA-C  levofloxacin (LEVAQUIN) 750 MG tablet Take 1 tablet (750 mg total) by mouth daily. 08/24/15   Chase Picket Ward, PA-C  metroNIDAZOLE (FLAGYL) 500 MG tablet Take 1 tablet (500 mg total) by mouth 2 (two) times daily. Patient not taking: Reported on 08/22/2015 04/23/15   Roxy Horseman, PA-C  ondansetron (ZOFRAN) 4 MG tablet Take 1 tablet (4 mg total) by mouth every 8 (eight) hours as needed for nausea or vomiting. 08/24/15   Jaime Pilcher Ward, PA-C   BP 97/55 mmHg  Pulse 80  Temp(Src) 100.4 F (38 C) (Oral)  Resp 16  Ht  (1.651 m)  Wt 54.432 kg  BMI 19.97 kg/m2  SpO2 100%  LMP 08/06/2015  Breastfeeding? Unknown Physical Exam  Constitutional: She is oriented to person, place,  and time. She appears well-developed and well-nourished.  Alert, NAD  HENT:  Head: Normocephalic and atraumatic.  OP with erythema, no exudates, no tonsillar hypertrophy. Tacky mucus membranes.  Cardiovascular: Normal rate, regular rhythm and normal heart sounds.  Exam reveals no gallop and no friction rub.   No murmur heard. Pulmonary/Chest: Effort normal. No respiratory distress. She has no wheezes. She exhibits no tenderness.  + left crackles  Abdominal: Soft. Bowel sounds are normal. She exhibits no distension. There is no tenderness.  Musculoskeletal: She exhibits no edema.   Neurological: She is alert and oriented to person, place, and time.  Psychiatric: She has a normal mood and affect. Her behavior is normal. Judgment and thought content normal.  Nursing note and vitals reviewed.   ED Course  Procedures (including critical care time) Labs Review Labs Reviewed - No data to display  Imaging Review Dg Chest 2 View  08/24/2015  CLINICAL DATA:  Pt states for the last 2 days, she has been having full body aches with chills and cough. H/o asthma. Smoker. Pt also became short of breath and dizzy during imaging. EXAM: CHEST - 2 VIEW COMPARISON:  04/28/2011 FINDINGS: Patchy somewhat nodular airspace opacities in the left mid lung. Right lung clear. Heart size normal. No effusion. Visualized skeletal structures are unremarkable. IMPRESSION: 1. Patchy left mid lung airspace disease suggesting pneumonia. Electronically Signed   By: Corlis Leak  Hassell M.D.   On: 08/24/2015 14:04   I have personally reviewed and evaluated these images and lab results as part of my medical decision-making.   EKG Interpretation None      MDM   Final diagnoses:  Cough  Community acquired pneumonia   Lori Lloyd presents with fever, cough, congestion. On exam, left lung field crackles, therefore chest x-ray was obtained which shows patchy left midlung opacity suggestive of pneumonia. Patient given first dose of Levaquin in ED. Patient concerned about price of antibiotics, therefore information was researched. [facility] and agrees that she can afford medication at this price. Patient with low BP while in ER, has had several low readings in the past, likely her baseline. Oral rehydration was performed in the ED.  A significant amount of time was taken to discuss the importance of primary care follow-up. Patient was informed of the number on discharge instructions to call for help finding PCP as well as [place] [facility] information. Return  precautions discussed. An care instructions given. All questions answered.   [facility] LLCJaime Pilcher Ward, PA-C 08/24/15 1437  Mancel BaleElliott Wentz, MD 08/24/15 [phone]

## 2015-12-06 ENCOUNTER — Inpatient Hospital Stay (HOSPITAL_COMMUNITY)
Admission: AD | Admit: 2015-12-06 | Discharge: 2015-12-06 | Disposition: A | Discharge status: Home / Self Care | Payer: Medicaid Other | Source: Ambulatory Visit | Attending: Obstetrics and Gynecology | Admitting: Obstetrics and Gynecology

## 2015-12-06 ENCOUNTER — Encounter (HOSPITAL_COMMUNITY): Payer: Self-pay | Admitting: *Deleted

## 2015-12-06 DIAGNOSIS — Z3490 Encounter for supervision of normal pregnancy, unspecified, unspecified trimester: Secondary | ICD-10-CM

## 2015-12-06 DIAGNOSIS — Z3491 Encounter for supervision of normal pregnancy, unspecified, first trimester: Secondary | ICD-10-CM | POA: Insufficient documentation

## 2015-12-06 DIAGNOSIS — Z3201 Encounter for pregnancy test, result positive: Secondary | ICD-10-CM

## 2015-12-06 LAB — URINALYSIS, ROUTINE W REFLEX MICROSCOPIC
Bilirubin Urine: NEGATIVE
Glucose, UA: NEGATIVE mg/dL
Hgb urine dipstick: NEGATIVE
KETONES UR: NEGATIVE mg/dL
Leukocytes, UA: NEGATIVE
NITRITE: NEGATIVE
PH: 6 (ref 5.0–8.0)
Protein, ur: NEGATIVE mg/dL
Specific Gravity, Urine: 1.01 (ref 1.005–1.030)

## 2015-12-06 LAB — POCT PREGNANCY, URINE: Preg Test, Ur: POSITIVE — AB

## 2015-12-06 NOTE — MAU Note (Signed)
Pt. States that she had a positive pregnancy test a couple of days ago.  She has been cramping for a few days now.  Pt. Denies any bleeding.

## 2015-12-06 NOTE — Discharge Instructions (Signed)
First Trimester of Pregnancy The first trimester of pregnancy is from week 1 until the end of week 12 (months 1 through 3). A week after a sperm fertilizes an egg, the egg will implant on the wall of the uterus. This embryo will begin to develop into a baby. Genes from you and your partner are forming the baby. The female genes determine whether the baby is a boy or a girl. At 6-8 weeks, the eyes and face are formed, and the heartbeat can be seen on ultrasound. At the end of 12 weeks, all the baby's organs are formed.  Now that you are pregnant, you will want to do everything you can to have a healthy baby. Two of the most important things are to get good prenatal care and to follow your health care provider's instructions. Prenatal care is all the medical care you receive before the baby's birth. This care will help prevent, find, and treat any problems during the pregnancy and childbirth. BODY CHANGES Your body goes through many changes during pregnancy. The changes vary from woman to woman.   You may gain or lose a couple of pounds at first.  You may feel sick to your stomach (nauseous) and throw up (vomit). If the vomiting is uncontrollable, call your health care provider.  You may tire easily.  You may develop headaches that can be relieved by medicines approved by your health care provider.  You may urinate more often. Painful urination may mean you have a bladder infection.  You may develop heartburn as a result of your pregnancy.  You may develop constipation because certain hormones are causing the muscles that push waste through your intestines to slow down.  You may develop hemorrhoids or swollen, bulging veins (varicose veins).  Your breasts may begin to grow larger and become tender. Your nipples may stick out more, and the tissue that surrounds them (areola) may become darker.  Your gums may bleed and may be sensitive to brushing and flossing.  Dark spots or blotches (chloasma,  mask of pregnancy) may develop on your face. This will likely fade after the baby is born.  Your menstrual periods will stop.  You may have a loss of appetite.  You may develop cravings for certain kinds of food.  You may have changes in your emotions from day to day, such as being excited to be pregnant or being concerned that something may go wrong with the pregnancy and baby.  You may have more vivid and strange dreams.  You may have changes in your hair. These can include thickening of your hair, rapid growth, and changes in texture. Some women also have hair loss during or after pregnancy, or hair that feels dry or thin. Your hair will most likely return to normal after your baby is born. WHAT TO EXPECT AT YOUR PRENATAL VISITS During a routine prenatal visit:  You will be weighed to make sure you and the baby are growing normally.  Your blood pressure will be taken.  Your abdomen will be measured to track your baby's growth.  The fetal heartbeat will be listened to starting around week 10 or 12 of your pregnancy.  Test results from any previous visits will be discussed. Your health care provider may ask you:  How you are feeling.  If you are feeling the baby move.  If you have had any abnormal symptoms, such as leaking fluid, bleeding, severe headaches, or abdominal cramping.  If you are using any tobacco products,   including cigarettes, chewing tobacco, and electronic cigarettes.  If you have any questions. Other tests that may be performed during your first trimester include:  Blood tests to find your blood type and to check for the presence of any previous infections. They will also be used to check for low iron levels (anemia) and Rh antibodies. Later in the pregnancy, blood tests for diabetes will be done along with other tests if problems develop.  Urine tests to check for infections, diabetes, or protein in the urine.  An ultrasound to confirm the proper growth  and development of the baby.  An amniocentesis to check for possible genetic problems.  Fetal screens for spina bifida and Down syndrome.  You may need other tests to make sure you and the baby are doing well.  HIV (human immunodeficiency virus) testing. Routine prenatal testing includes screening for HIV, unless you choose not to have this test. HOME CARE INSTRUCTIONS  Medicines  Follow your health care provider's instructions regarding medicine use. Specific medicines may be either safe or unsafe to take during pregnancy.  Take your prenatal vitamins as directed.  If you develop constipation, try taking a stool softener if your health care provider approves. Diet  Eat regular, well-balanced meals. Choose a variety of foods, such as meat or vegetable-based protein, fish, milk and low-fat dairy products, vegetables, fruits, and whole grain breads and cereals. Your health care provider will help you determine the amount of weight gain that is right for you.  Avoid raw meat and uncooked cheese. These carry germs that can cause birth defects in the baby.  Eating four or five small meals rather than three large meals a day may help relieve nausea and vomiting. If you start to feel nauseous, eating a few soda crackers can be helpful. Drinking liquids between meals instead of during meals also seems to help nausea and vomiting.  If you develop constipation, eat more high-fiber foods, such as fresh vegetables or fruit and whole grains. Drink enough fluids to keep your urine clear or pale yellow. Activity and Exercise  Exercise only as directed by your health care provider. Exercising will help you:  Control your weight.  Stay in shape.  Be prepared for labor and delivery.  Experiencing pain or cramping in the lower abdomen or low back is a good sign that you should stop exercising. Check with your health care provider before continuing normal exercises.  Try to avoid standing for long  periods of time. Move your legs often if you must stand in one place for a long time.  Avoid heavy lifting.  Wear low-heeled shoes, and practice good posture.  You may continue to have sex unless your health care provider directs you otherwise. Relief of Pain or Discomfort  Wear a good support bra for breast tenderness.   Take warm sitz baths to soothe any pain or discomfort caused by hemorrhoids. Use hemorrhoid cream if your health care provider approves.   Rest with your legs elevated if you have leg cramps or low back pain.  If you develop varicose veins in your legs, wear support hose. Elevate your feet for 15 minutes, 3-4 times a day. Limit salt in your diet. Prenatal Care  Schedule your prenatal visits by the twelfth week of pregnancy. They are usually scheduled monthly at first, then more often in the last 2 months before delivery.  Write down your questions. Take them to your prenatal visits.  Keep all your prenatal visits as directed by your   health care provider. Safety  Wear your seat belt at all times when driving.  Make a list of emergency phone numbers, including numbers for family, friends, the hospital, and police and fire departments. General Tips  Ask your health care provider for a referral to a local prenatal education class. Begin classes no later than at the beginning of month 6 of your pregnancy.  Ask for help if you have counseling or nutritional needs during pregnancy. Your health care provider can offer advice or refer you to specialists for help with various needs.  Do not use hot tubs, steam rooms, or saunas.  Do not douche or use tampons or scented sanitary pads.  Do not cross your legs for long periods of time.  Avoid cat litter boxes and soil used by cats. These carry germs that can cause birth defects in the baby and possibly loss of the fetus by miscarriage or stillbirth.  Avoid all smoking, herbs, alcohol, and medicines not prescribed by  your health care provider. Chemicals in these affect the formation and growth of the baby.  Do not use any tobacco products, including cigarettes, chewing tobacco, and electronic cigarettes. If you need help quitting, ask your health care provider. You may receive counseling support and other resources to help you quit.  Schedule a dentist appointment. At home, brush your teeth with a soft toothbrush and be gentle when you floss. SEEK MEDICAL CARE IF:   You have dizziness.  You have mild pelvic cramps, pelvic pressure, or nagging pain in the abdominal area.  You have persistent nausea, vomiting, or diarrhea.  You have a bad smelling vaginal discharge.  You have pain with urination.  You notice increased swelling in your face, hands, legs, or ankles. SEEK IMMEDIATE MEDICAL CARE IF:   You have a fever.  You are leaking fluid from your vagina.  You have spotting or bleeding from your vagina.  You have severe abdominal cramping or pain.  You have rapid weight gain or loss.  You vomit blood or material that looks like coffee grounds.  You are exposed to German measles and have never had them.  You are exposed to fifth disease or chickenpox.  You develop a severe headache.  You have shortness of breath.  You have any kind of trauma, such as from a fall or a car accident.   This information is not intended to replace advice given to you by your health care provider. Make sure you discuss any questions you have with your health care provider.   Document Released: 06/01/2001 Document Revised: 06/28/2014 Document Reviewed: 04/17/2013 Elsevier Interactive Patient Education 2016 [employer]  

## 2015-12-06 NOTE — MAU Provider Note (Signed)
S: In with pos preg test at home desires verification letter to start care. O: VSS, heart RRR, LCTAB, abd soft non tender A: early pregnancy P: verification letter, list of providers given.

## 2015-12-13 ENCOUNTER — Other Ambulatory Visit: Payer: Self-pay

## 2015-12-13 ENCOUNTER — Inpatient Hospital Stay (HOSPITAL_COMMUNITY)
Admission: AD | Admit: 2015-12-13 | Discharge: 2015-12-13 | Disposition: A | Discharge status: Home / Self Care | Payer: Medicaid Other | Attending: Family Medicine | Admitting: Family Medicine

## 2015-12-13 ENCOUNTER — Encounter (HOSPITAL_COMMUNITY): Payer: Self-pay | Admitting: *Deleted

## 2015-12-13 ENCOUNTER — Inpatient Hospital Stay (HOSPITAL_COMMUNITY): Payer: Medicaid Other

## 2015-12-13 DIAGNOSIS — O26891 Other specified pregnancy related conditions, first trimester: Secondary | ICD-10-CM | POA: Diagnosis not present

## 2015-12-13 DIAGNOSIS — O99331 Smoking (tobacco) complicating pregnancy, first trimester: Secondary | ICD-10-CM | POA: Diagnosis not present

## 2015-12-13 DIAGNOSIS — F329 Major depressive disorder, single episode, unspecified: Secondary | ICD-10-CM | POA: Insufficient documentation

## 2015-12-13 DIAGNOSIS — O99519 Diseases of the respiratory system complicating pregnancy, unspecified trimester: Secondary | ICD-10-CM

## 2015-12-13 DIAGNOSIS — N854 Malposition of uterus: Secondary | ICD-10-CM | POA: Insufficient documentation

## 2015-12-13 DIAGNOSIS — Z91013 Allergy to seafood: Secondary | ICD-10-CM | POA: Insufficient documentation

## 2015-12-13 DIAGNOSIS — J45909 Unspecified asthma, uncomplicated: Secondary | ICD-10-CM | POA: Insufficient documentation

## 2015-12-13 DIAGNOSIS — F17210 Nicotine dependence, cigarettes, uncomplicated: Secondary | ICD-10-CM | POA: Diagnosis not present

## 2015-12-13 DIAGNOSIS — R1033 Periumbilical pain: Secondary | ICD-10-CM | POA: Diagnosis present

## 2015-12-13 DIAGNOSIS — O99512 Diseases of the respiratory system complicating pregnancy, second trimester: Secondary | ICD-10-CM | POA: Diagnosis not present

## 2015-12-13 DIAGNOSIS — O99341 Other mental disorders complicating pregnancy, first trimester: Secondary | ICD-10-CM | POA: Insufficient documentation

## 2015-12-13 DIAGNOSIS — Z3491 Encounter for supervision of normal pregnancy, unspecified, first trimester: Secondary | ICD-10-CM

## 2015-12-13 DIAGNOSIS — R102 Pelvic and perineal pain: Secondary | ICD-10-CM | POA: Diagnosis not present

## 2015-12-13 DIAGNOSIS — Z3A01 Less than 8 weeks gestation of pregnancy: Secondary | ICD-10-CM | POA: Diagnosis not present

## 2015-12-13 LAB — CBC
HCT: 34.7 % — ABNORMAL LOW (ref 36.0–46.0)
HEMOGLOBIN: 11.7 g/dL — AB (ref 12.0–15.0)
MCH: 29.7 pg (ref 26.0–34.0)
MCHC: 33.7 g/dL (ref 30.0–36.0)
MCV: 88.1 fL (ref 78.0–100.0)
Platelets: 203 10*3/uL (ref 150–400)
RBC: 3.94 MIL/uL (ref 3.87–5.11)
RDW: 12.7 % (ref 11.5–15.5)
WBC: 7.6 10*3/uL (ref 4.0–10.5)

## 2015-12-13 LAB — URINE MICROSCOPIC-ADD ON

## 2015-12-13 LAB — URINALYSIS, ROUTINE W REFLEX MICROSCOPIC
Bilirubin Urine: NEGATIVE
GLUCOSE, UA: NEGATIVE mg/dL
Hgb urine dipstick: NEGATIVE
KETONES UR: NEGATIVE mg/dL
NITRITE: NEGATIVE
PROTEIN: NEGATIVE mg/dL
Specific Gravity, Urine: >1.03 — ABNORMAL HIGH (ref 1.005–1.030)
pH: 6 (ref 5.0–8.0)

## 2015-12-13 LAB — HCG, QUANTITATIVE, PREGNANCY: HCG, BETA CHAIN, QUANT, S: 25661 m[IU]/mL — AB (ref <5)

## 2015-12-13 LAB — WET PREP, GENITAL
Clue Cells Wet Prep HPF POC: NONE SEEN
Sperm: NONE SEEN
TRICH WET PREP: NONE SEEN
YEAST WET PREP: NONE SEEN

## 2015-12-13 MED ORDER — IPRATROPIUM BROMIDE 0.02 % IN SOLN: 0.5000 mg | Freq: Once | RESPIRATORY_TRACT | Status: DC

## 2015-12-13 MED ORDER — ALBUTEROL SULFATE (2.5 MG/3ML) 0.083% IN NEBU: 2.5000 mg | INHALATION_SOLUTION | Freq: Once | RESPIRATORY_TRACT | Status: DC

## 2015-12-13 MED ORDER — IPRATROPIUM-ALBUTEROL 0.5-2.5 (3) MG/3ML IN SOLN
3.0000 mL | Freq: Once | RESPIRATORY_TRACT | Status: AC
  Administered 2015-12-13: 3 mL via RESPIRATORY_TRACT
  Filled 2015-12-13: qty 3

## 2015-12-13 MED ORDER — ALBUTEROL SULFATE HFA 108 (90 BASE) MCG/ACT IN AERS
1.0000 | INHALATION_SPRAY | Freq: Four times a day (QID) | RESPIRATORY_TRACT | Status: DC | PRN
Start: 2015-12-13 — End: 2016-10-01

## 2015-12-13 NOTE — MAU Provider Note (Signed)
Chief Complaint: Chest Pain   First Provider Initiated Contact with Patient 12/13/15 0932      SUBJECTIVE HPI: Lori Lloyd is a 20 y.o. G4P1021 at 9035w5d by LMP who presents to maternity admissions reporting intermittent abdominal cramping that starts at the umbilicus and radiates down into her pelvis x 2-3 days, spotting 2 days ago that resolved, and onset of shortness of breath last night that kept her awake all night that is slightly improved today but she does have some chest soreness pain afterwards.  She reports it feels similar to when she had pneumonia in February. She denies recent respiratory infection but does report allergy symptoms including sneezing.  She has not tried any treatments for her pain, bleeding, or shortness of breath. She reports a history of asthma but has not used an inhaler or other medications in years.   She denies vaginal itching/burning, urinary symptoms, h/a, dizziness, n/v, or fever/chills.     HPI  Past Medical History  Diagnosis Date  . Asthma   . Eczema   . Fracture     ring finger right hand  . Asthma   . Depression    Past Surgical History  Procedure Laterality Date  . Wisdom tooth extraction     Social History   Social History  . Marital Status: Single    Spouse Name: N/A  . Number of Children: N/A  . Years of Education: N/A   Occupational History  . Not on file.   Social History Main Topics  . Smoking status: Current Every Day Smoker -- 0.25 packs/day for .5 years    Types: Cigarettes    Last Attempt to Quit: 03/01/2012  . Smokeless tobacco: Never Used  . Alcohol Use: No     Comment: Pt currently smokes 3-4 x per week  . Drug Use: No     Comment: not used for several months  . Sexual Activity: Yes    Birth Control/ Protection: None   Other Topics Concern  . Not on file   Social History Narrative   No current facility-administered medications on file prior to encounter.   No current outpatient prescriptions on file  prior to encounter.   Allergies  Allergen Reactions  . Shellfish Allergy Anaphylaxis    ROS:  Review of Systems  Constitutional: Negative for fever, chills and fatigue.  HENT: Negative for rhinorrhea and sinus pressure.   Respiratory: Positive for chest tightness and shortness of breath. Negative for cough.   Cardiovascular: Positive for chest pain. Negative for palpitations.  Gastrointestinal: Positive for abdominal pain. Negative for nausea, vomiting and constipation.  Genitourinary: Positive for vaginal bleeding and pelvic pain. Negative for dysuria, flank pain, vaginal discharge, difficulty urinating and vaginal pain.  Neurological: Negative for dizziness and headaches.  Psychiatric/Behavioral: Negative.      I have reviewed patient's Past Medical Hx, Surgical Hx, Family Hx, Social Hx, medications and allergies.   Physical Exam   Patient Vitals for the past 24 hrs:  BP Temp Temp src Pulse Resp SpO2  12/13/15 0950 - - - 78 - 100 %  12/13/15 0945 - - - 83 - 100 %  12/13/15 0944 - - - 75 - 100 %  12/13/15 0939 - - - 77 - 100 %  12/13/15 0938 - - - 84 - 100 %  12/13/15 0934 - - - 78 - 100 %  12/13/15 0930 - - - 71 - 100 %  12/13/15 0925 - - - 77 - 100 %  12/13/15 0921 - - - 66 - 100 %  12/13/15 0920 - - - 82 - 95 %  12/13/15 0918 - - - - - 100 %  12/13/15 0914 - - - 81 - 100 %  12/13/15 0913 - - - 94 - 100 %  12/13/15 0910 - - - 79 - 100 %  12/13/15 0907 - - - 78 - 100 %  12/13/15 0902 - - - 84 - 100 %  12/13/15 0901 - - - 74 - 100 %  12/13/15 0856 - - - 77 - 100 %  12/13/15 0852 - - - 81 - 100 %  12/13/15 0850 - - - 82 - 100 %  12/13/15 0845 - - - 78 - 100 %  12/13/15 0838 (!) 98/52 mmHg 98.3 F (36.8 C) Oral 82 18 -   Constitutional: Well-developed, well-nourished female in no acute distress.  Cardiovascular: normal rate Respiratory: normal effort GI: Abd soft, non-tender. Pos BS x 4 MS: Extremities nontender, no edema, normal ROM Neurologic: Alert and  oriented x 4.  GU: Neg CVAT.  PELVIC EXAM: Cervix pink, visually closed, slightly friable, scant white creamy discharge, vaginal walls and external genitalia normal Bimanual exam: Cervix 0/long/high, firm, anterior, neg CMT, uterus nontender, nonenlarged, adnexa without tenderness, enlargement, or mass   LAB RESULTS Results for orders placed or performed during the hospital encounter of 12/13/15 (from the past 24 hour(s))  Urinalysis, Routine w reflex microscopic (not at [facility])     Status: Abnormal   Collection Time: 12/13/15  8:30 AM  Result Value Ref Range   Color, Urine YELLOW YELLOW   APPearance HAZY (A) CLEAR   Specific Gravity, Urine >1.030 (H) 1.005 - 1.030   pH 6.0 5.0 - 8.0   Glucose, UA NEGATIVE NEGATIVE mg/dL   Hgb urine dipstick NEGATIVE NEGATIVE   Bilirubin Urine NEGATIVE NEGATIVE   Ketones, ur NEGATIVE NEGATIVE mg/dL   Protein, ur NEGATIVE NEGATIVE mg/dL   Nitrite NEGATIVE NEGATIVE   Leukocytes, UA MODERATE (A) NEGATIVE  Urine microscopic-add on     Status: Abnormal   Collection Time: 12/13/15  8:30 AM  Result Value Ref Range   Squamous Epithelial / LPF 0-5 (A) NONE SEEN   WBC, UA 6-30 0 - 5 WBC/hpf   RBC / HPF 0-5 0 - 5 RBC/hpf   Bacteria, UA MANY (A) NONE SEEN  CBC     Status: Abnormal   Collection Time: 12/13/15  9:28 AM  Result Value Ref Range   WBC 7.6 4.0 - 10.5 K/uL   RBC 3.94 3.87 - 5.11 MIL/uL   Hemoglobin 11.7 (L) 12.0 - 15.0 g/dL   HCT 16.1 (L) 09.6 - 04.5 %   MCV 88.1 78.0 - 100.0 fL   MCH 29.7 26.0 - 34.0 pg   MCHC 33.7 30.0 - 36.0 g/dL   RDW 40.9 81.1 - 91.4 %   Platelets 203 150 - 400 K/uL  hCG, quantitative, pregnancy     Status: Abnormal   Collection Time: 12/13/15  9:28 AM  Result Value Ref Range   hCG, Beta Chain, Quant, S 25661 (H) <5 mIU/mL  Wet prep, genital     Status: Abnormal   Collection Time: 12/13/15 10:00 AM  Result Value Ref Range   Yeast Wet Prep HPF POC NONE SEEN NONE SEEN   Trich, Wet Prep NONE SEEN NONE SEEN   Clue  Cells Wet Prep HPF POC NONE SEEN NONE SEEN   WBC, Wet Prep HPF POC FEW (A) NONE  SEEN   Sperm NONE SEEN        IMAGING [place] Ob Comp Less 14 Wks  12/13/2015  CLINICAL DATA:  20 year old pregnant female presents with pelvic pain. EDC by LMP: 08/09/2016, projecting to an expected gestational age of [redacted] weeks 5 days. EXAM: OBSTETRIC <14 WK US AND TRANSVAGINAL OB US TECHNIQUE: Both transabdominal and transvaginal ultrasound examinations were performed for complete evaluation of the gestation as well as the maternal uterus, adnexal regions, and pelvic cul-de-sac. Transvaginal technique was performed to assess early pregnancy. COMPARISON:  No prior scans from this gestation. FINDINGS: Intrauterine gestational sac: Single intrauterine gestational sac appears normal in size, shape and position. Yolk sac:  Present. Embryo:  Present. Embryonic Cardiac Activity: Present on cine sequence. Unable to obtain M-mode due to small size of the embryo. CRL:  2.2  mm   5 w   5 d                  US EDC: 08/09/2016 Subchorionic hemorrhage:  None visualized. Maternal uterus/adnexae: Anteverted uterus with no uterine fibroids demonstrated. Right ovary measures 5.0 x 2.9 x 3.0 cm and contains a 3.0 cm corpus luteum. Left ovary measures 4.1 x 1.9 x 2.2 cm. No abnormal ovarian or adnexal masses. Small volume simple free fluid in the right adnexa. IMPRESSION: 1. Single living intrauterine gestation at 5 weeks 5 days by crown-rump length, concordant with provided menstrual dating. 2. Embryonic cardiac activity is present. Embryonic heart rate could not be obtained due to small size of the embryo. 3. No abnormal ovarian or adnexal masses. 4. Small volume simple free fluid in the right adnexa. Electronically Signed   By: Delbert PhenixJason A Poff M.D.   On: 12/13/2015 11:17   [place] Ob Transvaginal  12/13/2015  CLINICAL DATA:  20 year old pregnant female presents with pelvic pain. EDC by LMP: 08/09/2016, projecting to an expected gestational age of [redacted] weeks  5 days. EXAM: OBSTETRIC <14 WK US AND TRANSVAGINAL OB US TECHNIQUE: Both transabdominal and transvaginal ultrasound examinations were performed for complete evaluation of the gestation as well as the maternal uterus, adnexal regions, and pelvic cul-de-sac. Transvaginal technique was performed to assess early pregnancy. COMPARISON:  No prior scans from this gestation. FINDINGS: Intrauterine gestational sac: Single intrauterine gestational sac appears normal in size, shape and position. Yolk sac:  Present. Embryo:  Present. Embryonic Cardiac Activity: Present on cine sequence. Unable to obtain M-mode due to small size of the embryo. CRL:  2.2  mm   5 w   5 d                  US EDC: 08/09/2016 Subchorionic hemorrhage:  None visualized. Maternal uterus/adnexae: Anteverted uterus with no uterine fibroids demonstrated. Right ovary measures 5.0 x 2.9 x 3.0 cm and contains a 3.0 cm corpus luteum. Left ovary measures 4.1 x 1.9 x 2.2 cm. No abnormal ovarian or adnexal masses. Small volume simple free fluid in the right adnexa. IMPRESSION: 1. Single living intrauterine gestation at 5 weeks 5 days by crown-rump length, concordant with provided menstrual dating. 2. Embryonic cardiac activity is present. Embryonic heart rate could not be obtained due to small size of the embryo. 3. No abnormal ovarian or adnexal masses. 4. Small volume simple free fluid in the right adnexa. Electronically Signed   By: Delbert PhenixJason A Poff M.D.   On: 12/13/2015 11:17    MAU Management/MDM: Ordered labs and US and reviewed results.  IUP noted on US.  Urine without clear evidence  of UTI so sent for culture.  Breathing treatment/nebulizer in MAU helped pt feel better and O2 sats remained 100% on RA before during and after treatment. Will prescribe albuterol rescue inhaler to use PRN and pt to f/u with early prenatal care in [place].  Pt stable at time of discharge.  ASSESSMENT 1. Normal IUP (intrauterine pregnancy) on prenatal ultrasound, first trimester    2. Pelvic pain affecting pregnancy in first trimester, antepartum   3. Asthma complicating pregnancy, antepartum     PLAN Discharge home    Medication List    TAKE these medications        albuterol 108 (90 Base) MCG/ACT inhaler  Commonly known as:  PROVENTIL HFA;VENTOLIN HFA  Inhale 1-2 puffs into the lungs every 6 (six) hours as needed for wheezing or shortness of breath.       Follow-up Information    Follow up with [facility].   Specialty:  Obstetrics and Gynecology   Why:  Start prenatal care as soon as possible, Return to MAU as needed for emergencies   Contact information:   [address] [place] [address] [phone]      Sharen Counter Certified Nurse-Midwife 12/13/2015  11:35 AM

## 2015-12-13 NOTE — MAU Note (Signed)
Pt states she is having chest pain, trouble breathing, and sharp pain in her stomach.  Pt states she has not taken anything for the pain but has been making sure she stays hydrated.

## 2015-12-13 NOTE — Discharge Instructions (Signed)
Asthma Attack Prevention While you may not be able to control the fact that you have asthma, you can take actions to prevent asthma attacks. The best way to prevent asthma attacks is to maintain good control of your asthma. You can achieve this by:  Taking your medicines as directed.  Avoiding things that can irritate your airways or make your asthma symptoms worse (asthma triggers).  Keeping track of how well your asthma is controlled and of any changes in your symptoms.  Responding quickly to worsening asthma symptoms (asthma attack).  Seeking emergency care when it is needed. WHAT ARE SOME WAYS TO PREVENT AN ASTHMA ATTACK? Have a Plan Work with your health care provider to create a written plan for managing and treating your asthma attacks (asthma action plan). This plan includes:  A list of your asthma triggers and how you can avoid them.  Information on when medicines should be taken and when their dosages should be changed.  The use of a device that measures how well your lungs are working (peak flow meter). Monitor Your Asthma Use your peak flow meter and record your results in a journal every day. A drop in your peak flow numbers on one or more days may indicate the start of an asthma attack. This can happen even before you start to feel symptoms. You can prevent an asthma attack from getting worse by following the steps in your asthma action plan. Avoid Asthma Triggers Work with your asthma health care provider to find out what your asthma triggers are. This can be done by:  Allergy testing.  Keeping a journal that notes when asthma attacks occur and the factors that may have contributed to them.  Determining if there are other medical conditions that are making your asthma worse. Once you have determined your asthma triggers, take steps to avoid them. This may include avoiding excessive or prolonged exposure to:  Dust. Have someone dust and vacuum your home for you once or  twice a week. Using a high-efficiency particulate arrestance (HEPA) vacuum is best.  Smoke. This includes campfire smoke, forest fire smoke, and secondhand smoke from tobacco products.  Pet dander. Avoid contact with animals that you know you are allergic to.  Allergens from trees, grasses or pollens. Avoid spending a lot of time outdoors when pollen counts are high, and on very windy days.  Very cold, dry, or humid air.  Mold.  Foods that contain high amounts of sulfites.  Strong odors.  Outdoor air pollutants, such as Museum/gallery exhibitions officer.  Indoor air pollutants, such as aerosol sprays and fumes from household cleaners.  Household pests, including dust mites and cockroaches, and pest droppings.  Certain medicines, including NSAIDs. Always talk to your health care provider before stopping or starting any new medicines. Medicines Take over-the-counter and prescription medicines only as told by your health care provider. Many asthma attacks can be prevented by carefully following your medicine schedule. Taking your medicines correctly is especially important when you cannot avoid certain asthma triggers. Act Quickly If an asthma attack does happen, acting quickly can decrease how severe it is and how long it lasts. Take these steps:   Pay attention to your symptoms. If you are coughing, wheezing, or having difficulty breathing, do not wait to see if your symptoms go away on their own. Follow your asthma action plan.   If you have followed your asthma action plan and your symptoms are not improving, call your health care provider or seek immediate medical  care at the nearest hospital. It is important to note how often you need to use your fast-acting rescue inhaler. If you are using your rescue inhaler more often, it may mean that your asthma is not under control. Adjusting your asthma treatment plan may help you to prevent future asthma attacks and help you to gain better control of your  condition. HOW CAN I PREVENT AN ASTHMA ATTACK WHEN I EXERCISE? Follow advice from your health care provider about whether you should use your fast-acting inhaler before exercising. Many people with asthma experience exercise-induced bronchoconstriction (EIB). This condition often worsens during vigorous exercise in cold, humid, or dry environments. Usually, people with EIB can stay very active by pre-treating with a fast-acting inhaler before exercising.   This information is not intended to replace advice given to you by your health care provider. Make sure you discuss any questions you have with your health care provider.   Document Released: 05/26/2009 Document Revised: 02/26/2015 Document Reviewed: 11/07/2014 Elsevier Interactive Patient Education 2016 [employer].  First Trimester of Pregnancy The first trimester of pregnancy is from week 1 until the end of week 12 (months 1 through 3). A week after a sperm fertilizes an egg, the egg will implant on the wall of the uterus. This embryo will begin to develop into a baby. Genes from you and your partner are forming the baby. The female genes determine whether the baby is a boy or a girl. At 6-8 weeks, the eyes and face are formed, and the heartbeat can be seen on ultrasound. At the end of 12 weeks, all the baby's organs are formed.  Now that you are pregnant, you will want to do everything you can to have a healthy baby. Two of the most important things are to get good prenatal care and to follow your health care provider's instructions. Prenatal care is all the medical care you receive before the baby's birth. This care will help prevent, find, and treat any problems during the pregnancy and childbirth. BODY CHANGES Your body goes through many changes during pregnancy. The changes vary from woman to woman.   You may gain or lose a couple of pounds at first.  You may feel sick to your stomach (nauseous) and throw up (vomit). If the vomiting is  uncontrollable, call your health care provider.  You may tire easily.  You may develop headaches that can be relieved by medicines approved by your health care provider.  You may urinate more often. Painful urination may mean you have a bladder infection.  You may develop heartburn as a result of your pregnancy.  You may develop constipation because certain hormones are causing the muscles that push waste through your intestines to slow down.  You may develop hemorrhoids or swollen, bulging veins (varicose veins).  Your breasts may begin to grow larger and become tender. Your nipples may stick out more, and the tissue that surrounds them (areola) may become darker.  Your gums may bleed and may be sensitive to brushing and flossing.  Dark spots or blotches (chloasma, mask of pregnancy) may develop on your face. This will likely fade after the baby is born.  Your menstrual periods will stop.  You may have a loss of appetite.  You may develop cravings for certain kinds of food.  You may have changes in your emotions from day to day, such as being excited to be pregnant or being concerned that something may go wrong with the pregnancy and baby.  You may have more vivid and strange dreams.  You may have changes in your hair. These can include thickening of your hair, rapid growth, and changes in texture. Some women also have hair loss during or after pregnancy, or hair that feels dry or thin. Your hair will most likely return to normal after your baby is born. WHAT TO EXPECT AT YOUR PRENATAL VISITS During a routine prenatal visit:  You will be weighed to make sure you and the baby are growing normally.  Your blood pressure will be taken.  Your abdomen will be measured to track your baby's growth.  The fetal heartbeat will be listened to starting around week 10 or 12 of your pregnancy.  Test results from any previous visits will be discussed. Your health care provider may ask  you:  How you are feeling.  If you are feeling the baby move.  If you have had any abnormal symptoms, such as leaking fluid, bleeding, severe headaches, or abdominal cramping.  If you are using any tobacco products, including cigarettes, chewing tobacco, and electronic cigarettes.  If you have any questions. Other tests that may be performed during your first trimester include:  Blood tests to find your blood type and to check for the presence of any previous infections. They will also be used to check for low iron levels (anemia) and Rh antibodies. Later in the pregnancy, blood tests for diabetes will be done along with other tests if problems develop.  Urine tests to check for infections, diabetes, or protein in the urine.  An ultrasound to confirm the proper growth and development of the baby.  An amniocentesis to check for possible genetic problems.  Fetal screens for spina bifida and Down syndrome.  You may need other tests to make sure you and the baby are doing well.  HIV (human immunodeficiency virus) testing. Routine prenatal testing includes screening for HIV, unless you choose not to have this test. HOME CARE INSTRUCTIONS  Medicines  Follow your health care provider's instructions regarding medicine use. Specific medicines may be either safe or unsafe to take during pregnancy.  Take your prenatal vitamins as directed.  If you develop constipation, try taking a stool softener if your health care provider approves. Diet  Eat regular, well-balanced meals. Choose a variety of foods, such as meat or vegetable-based protein, fish, milk and low-fat dairy products, vegetables, fruits, and whole grain breads and cereals. Your health care provider will help you determine the amount of weight gain that is right for you.  Avoid raw meat and uncooked cheese. These carry germs that can cause birth defects in the baby.  Eating four or five small meals rather than three large meals  a day may help relieve nausea and vomiting. If you start to feel nauseous, eating a few soda crackers can be helpful. Drinking liquids between meals instead of during meals also seems to help nausea and vomiting.  If you develop constipation, eat more high-fiber foods, such as fresh vegetables or fruit and whole grains. Drink enough fluids to keep your urine clear or pale yellow. Activity and Exercise  Exercise only as directed by your health care provider. Exercising will help you:  Control your weight.  Stay in shape.  Be prepared for labor and delivery.  Experiencing pain or cramping in the lower abdomen or low back is a good sign that you should stop exercising. Check with your health care provider before continuing normal exercises.  Try to avoid standing for long  periods of time. Move your legs often if you must stand in one place for a long time.  Avoid heavy lifting.  Wear low-heeled shoes, and practice good posture.  You may continue to have sex unless your health care provider directs you otherwise. Relief of Pain or Discomfort  Wear a good support bra for breast tenderness.   Take warm sitz baths to soothe any pain or discomfort caused by hemorrhoids. Use hemorrhoid cream if your health care provider approves.   Rest with your legs elevated if you have leg cramps or low back pain.  If you develop varicose veins in your legs, wear support hose. Elevate your feet for 15 minutes, 3-4 times a day. Limit salt in your diet. Prenatal Care  Schedule your prenatal visits by the twelfth week of pregnancy. They are usually scheduled monthly at first, then more often in the last 2 months before delivery.  Write down your questions. Take them to your prenatal visits.  Keep all your prenatal visits as directed by your health care provider. Safety  Wear your seat belt at all times when driving.  Make a list of emergency phone numbers, including numbers for family, friends, the  hospital, and police and fire departments. General Tips  Ask your health care provider for a referral to a local prenatal education class. Begin classes no later than at the beginning of month 6 of your pregnancy.  Ask for help if you have counseling or nutritional needs during pregnancy. Your health care provider can offer advice or refer you to specialists for help with various needs.  Do not use hot tubs, steam rooms, or saunas.  Do not douche or use tampons or scented sanitary pads.  Do not cross your legs for long periods of time.  Avoid cat litter boxes and soil used by cats. These carry germs that can cause birth defects in the baby and possibly loss of the fetus by miscarriage or stillbirth.  Avoid all smoking, herbs, alcohol, and medicines not prescribed by your health care provider. Chemicals in these affect the formation and growth of the baby.  Do not use any tobacco products, including cigarettes, chewing tobacco, and electronic cigarettes. If you need help quitting, ask your health care provider. You may receive counseling support and other resources to help you quit.  Schedule a dentist appointment. At home, brush your teeth with a soft toothbrush and be gentle when you floss. SEEK MEDICAL CARE IF:   You have dizziness.  You have mild pelvic cramps, pelvic pressure, or nagging pain in the abdominal area.  You have persistent nausea, vomiting, or diarrhea.  You have a bad smelling vaginal discharge.  You have pain with urination.  You notice increased swelling in your face, hands, legs, or ankles. SEEK IMMEDIATE MEDICAL CARE IF:   You have a fever.  You are leaking fluid from your vagina.  You have spotting or bleeding from your vagina.  You have severe abdominal cramping or pain.  You have rapid weight gain or loss.  You vomit blood or material that looks like coffee grounds.  You are exposed to [place] measles and have never had them.  You are exposed  to fifth disease or chickenpox.  You develop a severe headache.  You have shortness of breath.  You have any kind of trauma, such as from a fall or a car accident.   This information is not intended to replace advice given to you by your health care provider. Make  sure you discuss any questions you have with your health care provider.   Document Released: 06/01/2001 Document Revised: 06/28/2014 Document Reviewed: 04/17/2013 Elsevier Interactive Patient Education [employer]2016 Elsevier Inc.

## 2015-12-14 LAB — CULTURE, OB URINE

## 2015-12-14 LAB — HIV ANTIBODY (ROUTINE TESTING W REFLEX): HIV Screen 4th Generation wRfx: NONREACTIVE

## 2015-12-15 LAB — GC/CHLAMYDIA PROBE AMP (~~LOC~~) NOT AT ARMC
CHLAMYDIA, DNA PROBE: NEGATIVE
Neisseria Gonorrhea: NEGATIVE

## 2015-12-16 ENCOUNTER — Encounter (HOSPITAL_COMMUNITY): Payer: Self-pay | Admitting: *Deleted

## 2015-12-16 ENCOUNTER — Inpatient Hospital Stay (HOSPITAL_COMMUNITY)
Admission: AD | Admit: 2015-12-16 | Discharge: 2015-12-16 | Disposition: A | Discharge status: Home / Self Care | Payer: Medicaid Other | Source: Ambulatory Visit | Attending: Family Medicine | Admitting: Family Medicine

## 2015-12-16 DIAGNOSIS — O210 Mild hyperemesis gravidarum: Secondary | ICD-10-CM | POA: Diagnosis not present

## 2015-12-16 DIAGNOSIS — Z3A01 Less than 8 weeks gestation of pregnancy: Secondary | ICD-10-CM | POA: Insufficient documentation

## 2015-12-16 DIAGNOSIS — F329 Major depressive disorder, single episode, unspecified: Secondary | ICD-10-CM | POA: Insufficient documentation

## 2015-12-16 DIAGNOSIS — R1110 Vomiting, unspecified: Secondary | ICD-10-CM | POA: Diagnosis present

## 2015-12-16 DIAGNOSIS — J45909 Unspecified asthma, uncomplicated: Secondary | ICD-10-CM | POA: Insufficient documentation

## 2015-12-16 DIAGNOSIS — O99341 Other mental disorders complicating pregnancy, first trimester: Secondary | ICD-10-CM | POA: Diagnosis not present

## 2015-12-16 DIAGNOSIS — O99331 Smoking (tobacco) complicating pregnancy, first trimester: Secondary | ICD-10-CM | POA: Insufficient documentation

## 2015-12-16 DIAGNOSIS — O219 Vomiting of pregnancy, unspecified: Secondary | ICD-10-CM

## 2015-12-16 DIAGNOSIS — O99511 Diseases of the respiratory system complicating pregnancy, first trimester: Secondary | ICD-10-CM | POA: Diagnosis not present

## 2015-12-16 LAB — URINALYSIS, ROUTINE W REFLEX MICROSCOPIC
Bilirubin Urine: NEGATIVE
GLUCOSE, UA: NEGATIVE mg/dL
Ketones, ur: NEGATIVE mg/dL
NITRITE: NEGATIVE
PH: 7 (ref 5.0–8.0)
Protein, ur: NEGATIVE mg/dL
SPECIFIC GRAVITY, URINE: 1.015 (ref 1.005–1.030)

## 2015-12-16 LAB — URINE MICROSCOPIC-ADD ON: RBC / HPF: NONE SEEN RBC/hpf (ref 0–5)

## 2015-12-16 MED ORDER — MECLIZINE HCL 25 MG PO TABS: 25.0000 mg | ORAL_TABLET | Freq: Three times a day (TID) | ORAL | Status: DC | PRN

## 2015-12-16 MED ORDER — MECLIZINE HCL 25 MG PO TABS
25.0000 mg | ORAL_TABLET | Freq: Once | ORAL | Status: AC
  Administered 2015-12-16: 25 mg via ORAL
  Filled 2015-12-16: qty 1

## 2015-12-16 MED ORDER — PROMETHAZINE HCL 25 MG PO TABS: 25.0000 mg | ORAL_TABLET | Freq: Four times a day (QID) | ORAL | Status: DC | PRN

## 2015-12-16 NOTE — MAU Provider Note (Signed)
History     CSN: [id number]  Arrival date and time: 12/16/15 1217   First Provider Initiated Contact with Patient 12/16/15 1312      Chief Complaint  Patient presents with  . Emesis During Pregnancy   HPI  Pt is G4P1021 at 6945w1d pregnant and presents with stomach pain, nausea and vomiting.  Pt denies constipation or diarrhea, headache, spotting or bleeding or UTI sx. Pt has not taken anything for the sx. Pt has not appetite since yesterday- had a burger yesterday that stayed down.  Pt ate grits this morning and vomited.. Pt was seen on 6/24 with intermittent abd cramping and spotting, that has resolved. At that time, pt had viable IUP 4288w5d with CA but HR not obtained due to small size of embryo. Pt had small CLC on right ovary RN note:  Expand All Collapse All   Been really sick on her stomach, not taking anything. Had to leave work       Past Medical History  Diagnosis Date  . Asthma   . Eczema   . Fracture     ring finger right hand  . Asthma   . Depression     Past Surgical History  Procedure Laterality Date  . Wisdom tooth extraction      Family History  Problem Relation Age of Onset  . Hearing loss Neg Hx   . Diabetes Maternal Grandmother   . Hypertension Maternal Grandmother     Social History  Substance Use Topics  . Smoking status: Current Every Day Smoker -- 0.25 packs/day for .5 years    Types: Cigarettes    Last Attempt to Quit: 03/01/2012  . Smokeless tobacco: Never Used  . Alcohol Use: No     Comment: Pt currently smokes 3-4 x per week    Allergies:  Allergies  Allergen Reactions  . Shellfish Allergy Anaphylaxis    Prescriptions prior to admission  Medication Sig Dispense Refill Last Dose  . albuterol (PROVENTIL HFA;VENTOLIN HFA) 108 (90 Base) MCG/ACT inhaler Inhale 1-2 puffs into the lungs every 6 (six) hours as needed for wheezing or shortness of breath. 1 Inhaler 1     Review of Systems  Constitutional: Negative for fever and  chills.  Gastrointestinal: Positive for nausea and vomiting. Negative for abdominal pain, diarrhea and constipation.  Genitourinary: Negative for dysuria.  Musculoskeletal: Negative for back pain.  Neurological: Negative for headaches.   Physical Exam   Blood pressure 94/52, pulse 86, temperature 98 F (36.7 C), temperature source Oral, resp. rate 16, weight 129 lb 4 oz (58.627 kg), last menstrual period 11/03/2015.  Physical Exam  Nursing note and vitals reviewed. Constitutional: She is oriented to person, place, and time. She appears well-developed and well-nourished. No distress.  HENT:  Head: Normocephalic.  Eyes: Pupils are equal, round, and reactive to light.  Neck: Normal range of motion. Neck supple.  Cardiovascular: Normal rate.   Respiratory: Effort normal.  GI: Soft. She exhibits no distension. There is no tenderness. There is no rebound and no guarding.  Musculoskeletal: Normal range of motion.  Neurological: She is alert and oriented to person, place, and time.  Skin: Skin is warm and dry.  Psychiatric: She has a normal mood and affect.    MAU Course  Procedures Meclezine 25mg  PO given to pt- tolerated well Able to tolerate sprite and crackers Results for orders placed or performed during the hospital encounter of 12/16/15 (from the past 24 hour(s))  Urinalysis, Routine w reflex microscopic ([facility])     Status: Abnormal   Collection Time: 12/16/15 12:30 PM  Result Value Ref Range   Color, Urine YELLOW YELLOW   APPearance CLEAR CLEAR   Specific Gravity, Urine 1.015 1.005 - 1.030   pH 7.0 5.0 - 8.0   Glucose, UA NEGATIVE NEGATIVE mg/dL   Hgb urine dipstick TRACE (A) NEGATIVE   Bilirubin Urine NEGATIVE NEGATIVE   Ketones, ur NEGATIVE NEGATIVE mg/dL   Protein, ur NEGATIVE NEGATIVE mg/dL   Nitrite NEGATIVE NEGATIVE   Leukocytes, UA TRACE (A) NEGATIVE  Urine microscopic-add on     Status: Abnormal   Collection Time: 12/16/15 12:30 PM  Result Value Ref Range    Squamous Epithelial / LPF 6-30 (A) NONE SEEN   WBC, UA 0-5 0 - 5 WBC/hpf   RBC / HPF NONE SEEN 0 - 5 RBC/hpf   Bacteria, UA RARE (A) NONE SEEN   Urine-Other MUCOUS PRESENT    Assessment and Plan  Morning sickness/nausea and vomiting in pregnancy- first trimester Diet reviewed Rx Meclezine and Phenergan F/u with [facility] as planned  [facility],Ruthel Martine 12/16/2015, 1:12 PM

## 2015-12-16 NOTE — MAU Note (Signed)
Been really sick on her stomach, not taking anything.  Had to leave work

## 2015-12-16 NOTE — Discharge Instructions (Signed)

## 2015-12-19 ENCOUNTER — Encounter (HOSPITAL_COMMUNITY): Payer: Self-pay | Admitting: *Deleted

## 2015-12-19 ENCOUNTER — Inpatient Hospital Stay (HOSPITAL_COMMUNITY)
Admission: AD | Admit: 2015-12-19 | Discharge: 2015-12-19 | Disposition: A | Discharge status: Home / Self Care | Payer: Medicaid Other | Source: Ambulatory Visit | Attending: Obstetrics & Gynecology | Admitting: Obstetrics & Gynecology

## 2015-12-19 DIAGNOSIS — O99611 Diseases of the digestive system complicating pregnancy, first trimester: Secondary | ICD-10-CM | POA: Diagnosis not present

## 2015-12-19 DIAGNOSIS — O99511 Diseases of the respiratory system complicating pregnancy, first trimester: Secondary | ICD-10-CM | POA: Insufficient documentation

## 2015-12-19 DIAGNOSIS — Z3A01 Less than 8 weeks gestation of pregnancy: Secondary | ICD-10-CM | POA: Insufficient documentation

## 2015-12-19 DIAGNOSIS — Z87891 Personal history of nicotine dependence: Secondary | ICD-10-CM | POA: Insufficient documentation

## 2015-12-19 DIAGNOSIS — J45909 Unspecified asthma, uncomplicated: Secondary | ICD-10-CM | POA: Insufficient documentation

## 2015-12-19 DIAGNOSIS — K117 Disturbances of salivary secretion: Secondary | ICD-10-CM | POA: Diagnosis not present

## 2015-12-19 DIAGNOSIS — O219 Vomiting of pregnancy, unspecified: Secondary | ICD-10-CM | POA: Diagnosis not present

## 2015-12-19 LAB — URINALYSIS, ROUTINE W REFLEX MICROSCOPIC
BILIRUBIN URINE: NEGATIVE
Glucose, UA: NEGATIVE mg/dL
Hgb urine dipstick: NEGATIVE
Ketones, ur: NEGATIVE mg/dL
LEUKOCYTES UA: NEGATIVE
NITRITE: NEGATIVE
PH: 6 (ref 5.0–8.0)
Protein, ur: NEGATIVE mg/dL
Specific Gravity, Urine: >1.03 — ABNORMAL HIGH (ref 1.005–1.030)

## 2015-12-19 MED ORDER — METOCLOPRAMIDE HCL 10 MG PO TABS: 10.0000 mg | ORAL_TABLET | Freq: Three times a day (TID) | ORAL | Status: DC | PRN

## 2015-12-19 MED ORDER — METOCLOPRAMIDE HCL 10 MG PO TABS
10.0000 mg | ORAL_TABLET | Freq: Three times a day (TID) | ORAL | Status: DC | PRN
  Administered 2015-12-19: 10 mg via ORAL
  Filled 2015-12-19: qty 1

## 2015-12-19 NOTE — MAU Note (Signed)
Been throwing up, can't keep anything down. Is taking medication but it's not working

## 2015-12-19 NOTE — MAU Provider Note (Signed)
History     CSN: [id number]  Arrival date and time: 12/19/15 1321   First Provider Initiated Contact with Patient 12/19/15 1354      Chief Complaint  Patient presents with  . Emesis During Pregnancy   HPI Comments: [id number] @[redacted]w[redacted]d  c/o N/V since yesterday am. Unable to tolerate food and fluids. Took Phenergan around 10 then vomited again shortly after lunch. She admits to increased saliva and spitting often. She reports having difficult getting out of bed, feels weak. No fevers. No diarrhea or constipation. No VB or cramping. [facility]   OB History    Gravida Para Term Preterm AB TAB SAB Ectopic Multiple Living   4 1 1  0 2 2 0 0 0 1      Past Medical History  Diagnosis Date  . Asthma   . Eczema   . Fracture     ring finger right hand  . Asthma   . Depression     Past Surgical History  Procedure Laterality Date  . Wisdom tooth extraction    . Induced abortion      Family History  Problem Relation Age of Onset  . Hearing loss Neg Hx   . Diabetes Maternal Grandmother   . Hypertension Maternal Grandmother     Social History  Substance Use Topics  . Smoking status: Former Smoker -- 0.25 packs/day for .5 years    Types: Cigarettes  . Smokeless tobacco: Never Used     Comment: with preg  . Alcohol Use: No    Allergies:  Allergies  Allergen Reactions  . Shellfish Allergy Anaphylaxis    Prescriptions prior to admission  Medication Sig Dispense Refill Last Dose  . albuterol (PROVENTIL HFA;VENTOLIN HFA) 108 (90 Base) MCG/ACT inhaler Inhale 1-2 puffs into the lungs every 6 (six) hours as needed for wheezing or shortness of breath. 1 Inhaler 1   . meclizine (ANTIVERT) 25 MG tablet Take 1 tablet (25 mg total) by mouth 3 (three) times daily as needed for dizziness. 30 tablet 0   . promethazine (PHENERGAN) 25 MG tablet Take 1 tablet (25 mg total) by mouth every 6 (six) hours as needed for nausea or vomiting. 30 tablet 0     Review of  Systems  Constitutional: Positive for malaise/fatigue.  HENT: Negative.   Eyes: Negative.   Respiratory: Negative.   Cardiovascular: Negative.   Gastrointestinal: Positive for nausea and vomiting.  Genitourinary: Negative.   Musculoskeletal: Negative.   Skin: Negative.   Neurological: Positive for weakness.  Endo/Heme/Allergies: Negative.   Psychiatric/Behavioral: Negative.    Physical Exam   Blood pressure 102/63, pulse 85, temperature 98 F (36.7 C), temperature source Oral, resp. rate 18, weight 127 lb (57.607 kg), last menstrual period 11/03/2015.  Physical Exam  Constitutional: She is oriented to person, place, and time. Vital signs are normal. She appears well-developed and well-nourished. She does not have a sickly appearance.  HENT:  Head: Normocephalic and atraumatic.  Neck: Normal range of motion. Neck supple.  Cardiovascular: Normal rate.   Respiratory: Effort normal.  GI: Soft. She exhibits no distension. There is no tenderness. There is no rebound and no guarding.  Genitourinary:  deferred  Musculoskeletal: Normal range of motion.  Neurological: She is alert and oriented to person, place, and time.  Skin: Skin is warm and dry.  Psychiatric: She has a normal mood and affect.    MAU Course  Procedures Results for orders placed or performed during the hospital encounter  of 12/19/15 (from the past 24 hour(s))  Urinalysis, Routine w reflex microscopic (not [facility])     Status: Abnormal   Collection Time: 12/19/15  1:48 PM  Result Value Ref Range   Color, Urine YELLOW YELLOW   APPearance CLEAR CLEAR   Specific Gravity, Urine >1.030 (H) 1.005 - 1.030   pH 6.0 5.0 - 8.0   Glucose, UA NEGATIVE NEGATIVE mg/dL   Hgb urine dipstick NEGATIVE NEGATIVE   Bilirubin Urine NEGATIVE NEGATIVE   Ketones, ur NEGATIVE NEGATIVE mg/dL   Protein, ur NEGATIVE NEGATIVE mg/dL   Nitrite NEGATIVE NEGATIVE   Leukocytes, UA NEGATIVE NEGATIVE    MDM Labs ordered and reviewed. Reglan  10mg  po x1. Passed po challenge-food and fluids. Reports feeling better. Stable for discharge home.  Assessment and Plan   1. Nausea and vomiting during pregnancy prior to [redacted] weeks gestation   2. Ptyalism     Discharge home Reglan 10mg  po q8 prn #30, no refill Diclegis (OTC regimen)-info reviewed Continue Meclizine [facility] Return for worsening sx   Donette LarryMelanie Fahed Morten, CNM 12/19/2015, 2:04 PM

## 2015-12-19 NOTE — Discharge Instructions (Signed)

## 2015-12-28 ENCOUNTER — Inpatient Hospital Stay (HOSPITAL_COMMUNITY)
Admission: AD | Admit: 2015-12-28 | Discharge: 2015-12-29 | Disposition: A | Discharge status: Home / Self Care | Payer: Medicaid Other | Source: Ambulatory Visit | Attending: Obstetrics & Gynecology | Admitting: Obstetrics & Gynecology

## 2015-12-28 ENCOUNTER — Encounter (HOSPITAL_COMMUNITY): Payer: Self-pay | Admitting: *Deleted

## 2015-12-28 DIAGNOSIS — O99511 Diseases of the respiratory system complicating pregnancy, first trimester: Secondary | ICD-10-CM | POA: Insufficient documentation

## 2015-12-28 DIAGNOSIS — J45909 Unspecified asthma, uncomplicated: Secondary | ICD-10-CM | POA: Diagnosis not present

## 2015-12-28 DIAGNOSIS — O99341 Other mental disorders complicating pregnancy, first trimester: Secondary | ICD-10-CM | POA: Diagnosis not present

## 2015-12-28 DIAGNOSIS — F4321 Adjustment disorder with depressed mood: Secondary | ICD-10-CM

## 2015-12-28 DIAGNOSIS — Z91013 Allergy to seafood: Secondary | ICD-10-CM | POA: Insufficient documentation

## 2015-12-28 DIAGNOSIS — R109 Unspecified abdominal pain: Secondary | ICD-10-CM | POA: Diagnosis present

## 2015-12-28 DIAGNOSIS — Z87891 Personal history of nicotine dependence: Secondary | ICD-10-CM | POA: Insufficient documentation

## 2015-12-28 DIAGNOSIS — Z3A01 Less than 8 weeks gestation of pregnancy: Secondary | ICD-10-CM | POA: Diagnosis not present

## 2015-12-28 DIAGNOSIS — O219 Vomiting of pregnancy, unspecified: Secondary | ICD-10-CM | POA: Insufficient documentation

## 2015-12-28 LAB — URINALYSIS, ROUTINE W REFLEX MICROSCOPIC
BILIRUBIN URINE: NEGATIVE
Glucose, UA: NEGATIVE mg/dL
HGB URINE DIPSTICK: NEGATIVE
Ketones, ur: NEGATIVE mg/dL
Nitrite: NEGATIVE
PH: 6.5 (ref 5.0–8.0)
Protein, ur: NEGATIVE mg/dL
SPECIFIC GRAVITY, URINE: 1.025 (ref 1.005–1.030)

## 2015-12-28 LAB — URINE MICROSCOPIC-ADD ON

## 2015-12-28 MED ORDER — LACTATED RINGERS IV BOLUS (SEPSIS)
1000.0000 mL | Freq: Once | INTRAVENOUS | Status: AC
  Administered 2015-12-28: 1000 mL via INTRAVENOUS

## 2015-12-28 MED ORDER — DIPHENHYDRAMINE HCL 50 MG/ML IJ SOLN
25.0000 mg | Freq: Once | INTRAMUSCULAR | Status: AC | PRN
  Administered 2015-12-28: 23:00:00 via INTRAVENOUS
  Filled 2015-12-28: qty 1

## 2015-12-28 MED ORDER — METOCLOPRAMIDE HCL 5 MG/ML IJ SOLN
10.0000 mg | Freq: Once | INTRAMUSCULAR | Status: AC
  Administered 2015-12-28: 10 mg via INTRAVENOUS
  Filled 2015-12-28: qty 2

## 2015-12-28 NOTE — MAU Note (Signed)
Abdominal and pelvic pain. Patient says she just does not feel well at all this pregnancy.

## 2015-12-28 NOTE — MAU Provider Note (Signed)
Chief Complaint: Abdominal Pain   First Provider Initiated Contact with Patient 12/28/15 2132     SUBJECTIVE HPI: Lori Lloyd is a 20 y.o. G4P1021 at [redacted]w[redacted]d who presents to Maternity Admissions reporting increased nausea/vomitting for the last three weeks, worsening most over the last week and continued ptyalism. She reports vomiting 4-5 times a day with every meal. Symptoms are worse in the morning. She has been able to maintain good fluid intake. She reports having tried taking phenergan, melizine, and Reglan regularly as prescribed but has not received relief with any of these medications. Not always able to keep meds down. She occasionally experiences urinary incontinence with vomiting episodes. She developed mild lower abdominal discomfort beginning last night, which she describes as achy. Pt reports normal appetite. Patient denies blood or bile in emesis, dyuria, vaginal discharge and bleeding, diarrhea, constipation, fever, and headache.  Per pt she has gained 15 lb this pregnancy.   She has developed situational depression from nausea/vomiting interfering with her ability to work, as well as increased stress from domestic and financial concerns over the last month. She states having low energy, general weakness, anhedonia, and a markedly increased desire to sleep. She denies suicidal ideation or thoughts of harming herself or others.   Past Medical History  Diagnosis Date  . Asthma   . Eczema   . Fracture     ring finger right hand  . Asthma   . Depression    OB History  Gravida Para Term Preterm AB SAB TAB Ectopic Multiple Living  0 2 0 2 0 0 1    # Outcome Date GA Lbr Len/2nd Weight Sex Delivery Anes PTL Lv  4 Current           3 Term 10/03/12 [redacted]w[redacted]d 06:03 / 00:22 7 lb 12.9 oz (3.541 kg) F Vag-Spont EPI  Y  2 TAB           1 TAB              Past Surgical History  Procedure Laterality Date  . Wisdom tooth extraction    . Induced abortion     Social History    Social History  . Marital Status: Single    Spouse Name: N/A  . Number of Children: N/A  . Years of Education: N/A   Occupational History  . Not on file.   Social History Main Topics  . Smoking status: Former Smoker -- 0.25 packs/day for .5 years    Types: Cigarettes  . Smokeless tobacco: Never Used     Comment: with preg  . Alcohol Use: No  . Drug Use: No  . Sexual Activity: Yes    Birth Control/ Protection: None   Other Topics Concern  . Not on file   Social History Narrative   No current facility-administered medications on file prior to encounter.   Current Outpatient Prescriptions on File Prior to Encounter  Medication Sig Dispense Refill  . albuterol (PROVENTIL HFA;VENTOLIN HFA) 108 (90 Base) MCG/ACT inhaler Inhale 1-2 puffs into the lungs every 6 (six) hours as needed for wheezing or shortness of breath. 1 Inhaler 1  . meclizine (ANTIVERT) 25 MG tablet Take 1 tablet (25 mg total) by mouth 3 (three) times daily as needed for dizziness. (Patient not taking: Reported on 12/19/2015) 30 tablet 0   Allergies  Allergen Reactions  . Shellfish Allergy Anaphylaxis    I have reviewed the past Medical Hx, Surgical Hx, Social Hx, Allergies and  Medications.   Review of Systems  Constitutional: Positive for activity change and fatigue.  Gastrointestinal: Positive for nausea, vomiting and abdominal pain. Negative for diarrhea and constipation.  Genitourinary: Negative for dysuria, vaginal bleeding, vaginal discharge and vaginal pain.  Neurological: Positive for dizziness and weakness.  Psychiatric/Behavioral: Positive for sleep disturbance. Negative for suicidal ideas and self-injury.    OBJECTIVE Patient Vitals for the past 24 hrs:  BP Temp Temp src Pulse Resp Height Weight  12/29/15 0030 102/61 mmHg 98.4 F (36.9 C) Oral 72 18 - -  12/28/15 2046 (!) 99/51 mmHg 99.3 F (37.4 C) Oral 84 18 5' 1.42" (1.56 m) 130 lb 12.8 oz (59.33 kg)   Constitutional: Well-developed,  well-nourished female in no acute distress.  Head: Mucus membranes moist Cardiovascular: normal rate Respiratory: normal rate and effort.  GI: Abd soft. Pos BS x 4. Mild suprapubic tenderness. Neurologic: Alert and oriented x 4.  GU: Neg CVAT.  FHR 154 by informal BS [place].   LAB RESULTS Results for orders placed or performed during the hospital encounter of 12/28/15 (from the past 24 hour(s))  Urinalysis, Routine w reflex microscopic (not at [facility])     Status: Abnormal   Collection Time: 12/28/15  8:24 PM  Result Value Ref Range   Color, Urine YELLOW YELLOW   APPearance CLEAR CLEAR   Specific Gravity, Urine 1.025 1.005 - 1.030   pH 6.5 5.0 - 8.0   Glucose, UA NEGATIVE NEGATIVE mg/dL   Hgb urine dipstick NEGATIVE NEGATIVE   Bilirubin Urine NEGATIVE NEGATIVE   Ketones, ur NEGATIVE NEGATIVE mg/dL   Protein, ur NEGATIVE NEGATIVE mg/dL   Nitrite NEGATIVE NEGATIVE   Leukocytes, UA MODERATE (A) NEGATIVE  Urine microscopic-add on     Status: Abnormal   Collection Time: 12/28/15  8:24 PM  Result Value Ref Range   Squamous Epithelial / LPF 6-30 (A) NONE SEEN   WBC, UA 6-30 0 - 5 WBC/hpf   RBC / HPF 0-5 0 - 5 RBC/hpf   Bacteria, UA MANY (A) NONE SEEN    IMAGING Informal bedside ultrasound showed live single intrauterine pregnancy. Crown-rump length corresponding to 8 week 2 days gestation. Positive cardiac activity.  MAU COURSE Orders Placed This Encounter  Procedures  . Culture, OB Urine  . Urinalysis, Routine w reflex microscopic (not at [facility])  . Urine microscopic-add on  LR bolus, Reglan, Benadryl  Patient feeling much better. Tolerating PO's.   MDM - N/V of pregnancy w/out dehydration or hyperemesis.  - Mild generalized abdominal soreness the patient attributes to frequent vomiting. Low suspicion for appendicitis or other emergent abdominal process due to location of pain and absence or fever. - Situational depression exacerbated by nausea and vomiting of pregnancy and side  effects of medicines. Patient denies suicidal or homicidal ideation. Has good support system.  ASSESSMENT 1. Nausea and vomiting of pregnancy, antepartum   2. Situational depression    PLAN Discharge home in stable condition.  Discussed with patient the limited options for anti-emetic therapy during pregnancy. She declined to try Zofran at this stage of pregnancy once informed of its possible risks to fetal development during the first trimester. Reviewed the importance of maintaining a regular schedule with anti-emetic medications for their optimal effectiveness. Informed that she can increase her morning dose of Reglan to 20 mg.  Provided patient with resources for contacting psychological consueling to address situational depression. Discussed with patient possible ways to alleviate stress in the workplace and at home. Patient's was accompanied at today's  visit by her sister, who the patient references as a strong source of support, along with her brother and grandmother. First trimester Precautions Rx Robinul for ptyalism.     Follow-up Information    [facility]   Why:  Start prenatal care   Contact information:   [address]  [address] [place] [id number] [phone]       [facility] with [facility].   Why:  As needed in emergencies   Contact information:   [address] [id number] mc [place] [phone]       Medication List    TAKE these medications        albuterol 108 (90 Base) MCG/ACT inhaler  Commonly known as:  PROVENTIL HFA;VENTOLIN HFA  Inhale 1-2 puffs into the lungs every 6 (six) hours as needed for wheezing or shortness of breath.     glycopyrrolate 1 MG tablet  Commonly known as:  ROBINUL  Take 1-2 tablets (1-2 mg total) by mouth 3 (three) times daily.     meclizine 25 MG tablet  Commonly known as:  ANTIVERT  Take 1 tablet (25 mg  total) by mouth 3 (three) times daily as needed for dizziness.     metoCLOPramide 10 MG tablet  Commonly known as:  REGLAN  Take 1 tablet (10 mg total) by mouth as directed. 20 mg in the morning and 10 mg at lunch, dinner and bedtime.     ranitidine 15 MG/ML syrup  Commonly known as:  ZANTAC  Take 10 mLs (150 mg total) by mouth 2 (two) times daily.         Dorathy KinsmanVirginia Cherylene Ferrufino, CNM 12/29/2015  1:21 AM

## 2015-12-29 DIAGNOSIS — O219 Vomiting of pregnancy, unspecified: Secondary | ICD-10-CM | POA: Diagnosis not present

## 2015-12-29 MED ORDER — RANITIDINE HCL 15 MG/ML PO SYRP: 150.0000 mg | ORAL_SOLUTION | Freq: Two times a day (BID) | ORAL | Status: DC

## 2015-12-29 MED ORDER — GLYCOPYRROLATE 1 MG PO TABS: 1.0000 mg | ORAL_TABLET | Freq: Three times a day (TID) | ORAL | Status: DC

## 2015-12-29 MED ORDER — METOCLOPRAMIDE HCL 10 MG PO TABS: 10.0000 mg | ORAL_TABLET | ORAL | Status: DC

## 2015-12-29 NOTE — Discharge Instructions (Signed)
Eating Plan for nausea and vomiting of pregnancy/Hyperemesis Gravidarum Severe cases of nausea and vomiting of pregnancy (hyperemesis gravidarum) can lead to dehydration and malnutrition. The hyperemesis eating plan is one way to lessen the symptoms of nausea and vomiting. It is often used with prescribed medicines to control your symptoms.  WHAT CAN I DO TO RELIEVE MY SYMPTOMS? Listen to your body. Everyone is different and has different preferences. Find what works best for you. Some of the following things may help:  Eat and drink slowly.  Eat 5-6 small meals daily instead of 3 large meals.   Eat crackers before you get out of bed in the morning.   Starchy foods are usually well tolerated (such as cereal, toast, bread, potatoes, pasta, rice, and pretzels).   Ginger may help with nausea. Add  tsp ground ginger to hot tea or choose ginger tea.   Try drinking 100% fruit juice or an electrolyte drink.  Continue to take your prenatal vitamins as directed by your health care provider. If you are having trouble taking your prenatal vitamins, talk with your health care provider about different options.  Include at least 1 serving of protein with your meals and snacks (such as meats or poultry, beans, nuts, eggs, or yogurt). Try eating a protein-rich snack before bed (such as cheese and crackers or a half [place] or peanut butter sandwich). WHAT THINGS SHOULD I AVOID TO REDUCE MY SYMPTOMS? The following things may help reduce your symptoms:  Avoid foods with strong smells. Try eating meals in well-ventilated areas that are free of odors.  Avoid drinking water or other beverages with meals. Try not to drink anything less than 30 minutes before and after meals.  Avoid drinking more than 1 cup of fluid at a time.  Avoid fried or high-fat foods, such as butter and cream sauces.  Avoid spicy foods.  Avoid skipping meals the best you can. Nausea can be more intense on an empty stomach. If  you cannot tolerate food at that time, do not force it. Try sucking on ice chips or other frozen items and make up the calories later.  Avoid lying down within 2 hours after eating.   This information is not intended to replace advice given to you by your health care provider. Make sure you discuss any questions you have with your health care provider.   Document Released: 04/04/2007 Document Revised: 06/12/2013 Document Reviewed: 04/11/2013 Elsevier Interactive Patient Education [employer]2016 Elsevier Inc.

## 2015-12-30 LAB — CULTURE, OB URINE: SPECIAL REQUESTS: NORMAL

## 2016-01-21 LAB — OB RESULTS CONSOLE HGB/HCT, BLOOD
HCT: 39 %
Hemoglobin: 13 g/dL

## 2016-01-21 LAB — OB RESULTS CONSOLE GC/CHLAMYDIA
Chlamydia: NEGATIVE
GC PROBE AMP, GENITAL: NEGATIVE

## 2016-01-21 LAB — OB RESULTS CONSOLE PLATELET COUNT: PLATELETS: 238 10*3/uL

## 2016-01-21 LAB — OB RESULTS CONSOLE ABO/RH: RH Type: POSITIVE

## 2016-01-21 LAB — OB RESULTS CONSOLE RPR: RPR: NONREACTIVE

## 2016-01-21 LAB — OB RESULTS CONSOLE RUBELLA ANTIBODY, IGM: RUBELLA: IMMUNE

## 2016-01-21 LAB — OB RESULTS CONSOLE HEPATITIS B SURFACE ANTIGEN: Hepatitis B Surface Ag: NEGATIVE

## 2016-01-21 LAB — OB RESULTS CONSOLE HIV ANTIBODY (ROUTINE TESTING): HIV: NONREACTIVE

## 2016-01-21 LAB — OB RESULTS CONSOLE ANTIBODY SCREEN: ANTIBODY SCREEN: NEGATIVE

## 2016-02-11 LAB — CYTOLOGY - PAP

## 2016-03-16 ENCOUNTER — Encounter (HOSPITAL_COMMUNITY): Payer: Self-pay

## 2016-03-16 ENCOUNTER — Inpatient Hospital Stay (HOSPITAL_COMMUNITY)
Admission: AD | Admit: 2016-03-16 | Discharge: 2016-03-16 | Disposition: A | Discharge status: Home / Self Care | Payer: Medicaid Other | Source: Ambulatory Visit | Attending: Family Medicine | Admitting: Family Medicine

## 2016-03-16 DIAGNOSIS — R109 Unspecified abdominal pain: Secondary | ICD-10-CM | POA: Diagnosis not present

## 2016-03-16 DIAGNOSIS — S301XXA Contusion of abdominal wall, initial encounter: Secondary | ICD-10-CM | POA: Insufficient documentation

## 2016-03-16 DIAGNOSIS — O26892 Other specified pregnancy related conditions, second trimester: Secondary | ICD-10-CM | POA: Insufficient documentation

## 2016-03-16 DIAGNOSIS — W501XXA Accidental kick by another person, initial encounter: Secondary | ICD-10-CM | POA: Diagnosis not present

## 2016-03-16 DIAGNOSIS — Z3A19 19 weeks gestation of pregnancy: Secondary | ICD-10-CM | POA: Diagnosis not present

## 2016-03-16 DIAGNOSIS — O9989 Other specified diseases and conditions complicating pregnancy, childbirth and the puerperium: Secondary | ICD-10-CM

## 2016-03-16 DIAGNOSIS — Z87891 Personal history of nicotine dependence: Secondary | ICD-10-CM | POA: Diagnosis not present

## 2016-03-16 DIAGNOSIS — E860 Dehydration: Secondary | ICD-10-CM

## 2016-03-16 DIAGNOSIS — R1030 Lower abdominal pain, unspecified: Secondary | ICD-10-CM | POA: Diagnosis present

## 2016-03-16 LAB — URINE MICROSCOPIC-ADD ON

## 2016-03-16 LAB — URINALYSIS, ROUTINE W REFLEX MICROSCOPIC
BILIRUBIN URINE: NEGATIVE
Glucose, UA: NEGATIVE mg/dL
Hgb urine dipstick: NEGATIVE
Ketones, ur: 15 mg/dL — AB
NITRITE: NEGATIVE
PROTEIN: NEGATIVE mg/dL
SPECIFIC GRAVITY, URINE: 1.025 (ref 1.005–1.030)
pH: 5.5 (ref 5.0–8.0)

## 2016-03-16 MED ORDER — CYCLOBENZAPRINE HCL 10 MG PO TABS: 10.0000 mg | ORAL_TABLET | Freq: Two times a day (BID) | ORAL | 0 refills | Status: DC | PRN

## 2016-03-16 NOTE — Discharge Instructions (Signed)
Blunt Abdominal Trauma °Blunt abdominal trauma is a type of injury that involves damage to the abdominal wall or to abdominal organs, such as the liver or spleen. The damage can involve bruising, tearing, or a rupture. This type of injury does not involve a puncture of the skin. °Blunt abdominal trauma can range from mild to severe. In some cases it can lead to a severe abdominal inflammation (peritonitis), severe bleeding, and a dangerous drop in blood pressure. °CAUSES °This injury is caused by a hard, direct hit to the abdomen. It can happen after: °· A motor vehicle accident. °· Being kicked or punched in the abdomen. °· Falling from a significant height. °RISK FACTORS °This injury is more likely to happen in people who: °· Play contact sports. °· Work in a job in which falls or injuries are more likely, such as in construction. °SYMPTOMS °The main symptom of this condition is pain in the abdomen. Other symptoms depend on the type and location of the injury. They can include: °· Abdominal pain that spreads to the the back or shoulder. °· Bruising. °· Swelling. °· Pain when pressing on the abdomen. °· Blood in the urine. °· Weakness. °· Confusion. °· Loss of consciousness. °· Pale, dusky, cool, or sweaty skin. °· Vomiting blood. °· Bloody stool or bleeding from the rectum. °· Trouble breathing. °Symptoms of this injury can develop suddenly or slowly.  °DIAGNOSIS °This injury is diagnosed based on your symptoms and a physical exam. You may also have tests, including: °· Blood tests. °· Urine tests. °· Imaging tests, such as: °¨ A CT scan and ultrasound of your abdomen. °¨ X-rays of your chest and abdomen. °· A test in which a tube is used to flush your abdomen with fluid and check for blood (diagnostic peritoneal lavage). °TREATMENT °Treatment for this injury depends on its type and severity. Treatment options include: °· Observation. If the injury is mild, this may be the only treatment needed. °· Support of your  blood pressure and breathing. °· Getting blood, fluids, or medicine through an IV tube. °· Antibiotic medicine. °· Insertion of tubes into the stomach or bladder. °· A blood transfusion. °· A procedure to stop bleeding. This involves putting a long, thin tube (catheter) into one of your blood vessels (angiographic embolization). °· Surgery to open up your abdomen and control bleeding or repair damage (laparotomy). This may be done if tests suggest that you have peritonitis or bleeding that cannot be controlled with angiographic embolization. °HOME CARE INSTRUCTIONS °· Take medicines only as directed by your health care provider. °· If you were prescribed an antibiotic medicine, finish all of it even if you start to feel better. °· Follow your health care provider's instructions about diet and activity restrictions. °· Keep all follow-up visits as directed by your health care provider. This is important. °SEEK MEDICAL CARE IF: °· You continue to have abdominal pain. °· Your symptoms return. °· You develop new symptoms. °· You have blood in your urine or your bowel movements. °SEEK IMMEDIATE MEDICAL CARE IF: °· You vomit blood. °· You have heavy bleeding from your rectum. °· You have very bad abdominal pain. °· You have trouble breathing. °· You have chest pain. °· You have a fever. °· You have dizziness. °· You pass out. °  °This information is not intended to replace advice given to you by your health care provider. Make sure you discuss any questions you have with your health care provider. °  °Document Released:   07/15/2004 Document Revised: 10/22/2014 Document Reviewed: 05/29/2014 °Elsevier Interactive Patient Education ©2016 Elsevier Inc. ° °
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
## 2016-04-29 ENCOUNTER — Encounter (HOSPITAL_COMMUNITY): Payer: Self-pay | Admitting: *Deleted

## 2016-04-29 ENCOUNTER — Inpatient Hospital Stay (HOSPITAL_COMMUNITY)
Admission: AD | Admit: 2016-04-29 | Discharge: 2016-04-30 | Disposition: A | Discharge status: Home / Self Care | Payer: Medicaid Other | Source: Ambulatory Visit | Attending: Obstetrics & Gynecology | Admitting: Obstetrics & Gynecology

## 2016-04-29 DIAGNOSIS — Z87891 Personal history of nicotine dependence: Secondary | ICD-10-CM | POA: Insufficient documentation

## 2016-04-29 DIAGNOSIS — O9989 Other specified diseases and conditions complicating pregnancy, childbirth and the puerperium: Secondary | ICD-10-CM

## 2016-04-29 DIAGNOSIS — Z3A25 25 weeks gestation of pregnancy: Secondary | ICD-10-CM

## 2016-04-29 DIAGNOSIS — O26892 Other specified pregnancy related conditions, second trimester: Secondary | ICD-10-CM | POA: Diagnosis not present

## 2016-04-29 DIAGNOSIS — M549 Dorsalgia, unspecified: Secondary | ICD-10-CM | POA: Diagnosis not present

## 2016-04-29 DIAGNOSIS — M545 Low back pain, unspecified: Secondary | ICD-10-CM

## 2016-04-29 DIAGNOSIS — O99891 Other specified diseases and conditions complicating pregnancy: Secondary | ICD-10-CM

## 2016-04-29 LAB — URINALYSIS, ROUTINE W REFLEX MICROSCOPIC
BILIRUBIN URINE: NEGATIVE
Glucose, UA: NEGATIVE mg/dL
KETONES UR: NEGATIVE mg/dL
NITRITE: NEGATIVE
PH: 6 (ref 5.0–8.0)
Protein, ur: NEGATIVE mg/dL
SPECIFIC GRAVITY, URINE: 1.02 (ref 1.005–1.030)

## 2016-04-29 LAB — URINE MICROSCOPIC-ADD ON: RBC / HPF: NONE SEEN RBC/hpf (ref 0–5)

## 2016-04-29 MED ORDER — CYCLOBENZAPRINE HCL 10 MG PO TABS: 10.0000 mg | ORAL_TABLET | Freq: Two times a day (BID) | ORAL | 0 refills | Status: DC

## 2016-04-29 MED ORDER — CYCLOBENZAPRINE HCL 10 MG PO TABS: 10.0000 mg | ORAL_TABLET | Freq: Two times a day (BID) | ORAL | Status: DC

## 2016-04-29 NOTE — Discharge Instructions (Signed)

## 2016-04-29 NOTE — MAU Note (Addendum)
PT SAYS HER BACK  ON HER RIGHT   SIDE  STARTED  HURTING  ON Monday-    SHE  GETS  HER  [facility]  AT [facility]   .  DENIES HSV AND  MRSA.   LAST  SEX-  AUG.  .   SAYS SHE  HAS LOW  LYING  PLACENTA-  NO  BLEEDING.      TOOK  2  TYLENOL   AT 930PM

## 2016-04-29 NOTE — MAU Note (Addendum)
Patient presents with pain 6/10 in right lower back.  Denies other symptoms, no n/v/d.  States she saw her provider last Monday 10/29 and has been experiencing this pain consistently since 11/5.  Last BM was today.   States she has been taking Tylenol Extra Strength every few hours, approximately 6 pills per day.

## 2016-04-29 NOTE — MAU Provider Note (Signed)
History     CSN: [id number]  Arrival date and time: 04/29/16 2132   First Provider Initiated Contact with Patient 04/29/16 2233      Chief Complaint  Patient presents with  . Back Pain   20 yo G4P1 @ 25.3 who presents to the MAU for right sided lumbar pain of 4 days duration. Pain is described as an achy/sharp pain that comes and goes throughout the day. Pain is 10/10 at its worse but is currently undetectable. Patient has been taking tylenol extra strength and states that this helps with pain relief. Patient denies any N/V, GI symptoms, Sp, SOB, abdominal trauma, change in vagional discharge and states that she feels the baby moving    Past Medical History:  Diagnosis Date  . Asthma   . Asthma   . Depression   . Eczema   . Fracture    ring finger right hand    Past Surgical History:  Procedure Laterality Date  . INDUCED ABORTION    . WISDOM TOOTH EXTRACTION      Family History  Problem Relation Age of Onset  . Diabetes Maternal Grandmother   . Hypertension Maternal Grandmother   . Hearing loss Neg Hx     Social History  Substance Use Topics  . Smoking status: Former Smoker    Packs/day: 0.25    Years: 0.50    Types: Cigarettes  . Smokeless tobacco: Never Used     Comment: with preg  . Alcohol use No    Allergies:  Allergies  Allergen Reactions  . Shellfish Allergy Anaphylaxis    Prescriptions Prior to Admission  Medication Sig Dispense Refill Last Dose  . albuterol (PROVENTIL HFA;VENTOLIN HFA) 108 (90 Base) MCG/ACT inhaler Inhale 1-2 puffs into the lungs every 6 (six) hours as needed for wheezing or shortness of breath. 1 Inhaler 1 Rescue  . cyclobenzaprine (FLEXERIL) 10 MG tablet Take 1 tablet (10 mg total) by mouth 2 (two) times daily as needed for muscle spasms. 6 tablet 0   . triamcinolone (KENALOG) 0.025 % ointment Apply 1 application topically daily.  1 03/16/2016 at Unknown time    Review of Systems  Constitutional: Negative for fever.  Eyes:  Negative for blurred vision.  Respiratory: Negative for shortness of breath.   Cardiovascular: Negative for chest pain.  Gastrointestinal: Negative for abdominal pain, constipation, diarrhea, heartburn, nausea and vomiting.  Genitourinary: Negative for dysuria.  Musculoskeletal: Positive for myalgias.  Neurological: Negative for dizziness.  Psychiatric/Behavioral: Negative for depression.   Physical Exam   Blood pressure 105/59, pulse 90, temperature 97.9 F (36.6 C), temperature source Oral, resp. rate 18, height 5\' 1"  (1.549 m), weight 173 lb 4 oz (78.6 kg), last menstrual period 11/03/2015.  Physical Exam  Constitutional: She appears well-developed and well-nourished. No distress.  HENT:  Head: Normocephalic and atraumatic.  Eyes: Conjunctivae and EOM are normal.  Neck: Normal range of motion.  Cardiovascular: Normal rate, regular rhythm and intact distal pulses.   Respiratory: Effort normal. No respiratory distress.  GI: There is no tenderness.  No CVA tenderness  Musculoskeletal: Normal range of motion. She exhibits no tenderness.  Bilateral straight leg test is negative  Psychiatric: She has a normal mood and affect. Her behavior is normal. Judgment and thought content normal.    MAU Course  Procedures  MDM Patient presents for evaluation of right sided lower back pain, without radiation which is not reproducible on exam  Assessment and Plan  20 yo Gp2 @ 25.3 who presents  to the MAU for localized right sided paraspinal lumbar pain with is not currently present s/p tylenol Likely muscle spasm vs 2/2 to normal MSK changes of pregnancy Patient advised:   Wear low-heeled (but not flat) shoes with good arch support.  Get help when lifting heavy objects.  Squat down, bend knees and keep the back straight when lifting.  Sit in chairs with good back support, or use a small pillow to provide support.  Sleep on the side with pillows between the knees for support.  Apply heat,  cold, or massage to the painful area. - Patient is prescribed Flexaril 10 mg daily and advised to follow up with care provider Patient is to be discharged home with above precautions  Josue D Santos 04/29/2016, 11:07 PM   CNM attestation:  I have seen and examined this patient; I agree with above documentation in the resident's note.   Yaritzi A Excell SeltzerBaker is a 20 y.o. [id number] reporting right sided lumbar pain, +FM, denies LOF, VB, contractions, vaginal discharge.  PE: BP 105/59 (BP Location: Right Arm)   Pulse 90   Temp 97.9 F (36.6 C) (Oral)   Resp 18   Ht 5\' 1"  (1.549 m)   Wt 78.6 kg (173 lb 4 oz)   LMP 11/03/2015 (Approximate)   BMI 32.74 kg/m  Gen: calm comfortable, NAD Resp: normal effort, no distress Abd: gravid  ROS, labs, PMH reviewed NST reactive 130-140s, +accels, no decels Occ irritability  Plan: - Comfort measures given for back pain in preg - Flexeril rx given #15 - continue routine follow up in OB clinic  Cam HaiSHAW, KIMBERLY, CNM 12:02 AM  04/30/2016

## 2016-05-19 LAB — GLUCOSE, 1 HOUR: Glucose 1 Hr Prenatal, POC: 109 mg/dL

## 2016-05-19 LAB — OB RESULTS CONSOLE HIV ANTIBODY (ROUTINE TESTING): HIV: NONREACTIVE

## 2016-06-09 ENCOUNTER — Ambulatory Visit (INDEPENDENT_AMBULATORY_CARE_PROVIDER_SITE_OTHER): Payer: Medicaid Other | Admitting: Obstetrics and Gynecology

## 2016-06-09 ENCOUNTER — Encounter: Payer: Self-pay | Admitting: Obstetrics and Gynecology

## 2016-06-09 DIAGNOSIS — O4403 Placenta previa specified as without hemorrhage, third trimester: Secondary | ICD-10-CM | POA: Diagnosis present

## 2016-06-09 DIAGNOSIS — O4400 Placenta previa specified as without hemorrhage, unspecified trimester: Secondary | ICD-10-CM | POA: Insufficient documentation

## 2016-06-09 DIAGNOSIS — Z8759 Personal history of other complications of pregnancy, childbirth and the puerperium: Secondary | ICD-10-CM

## 2016-06-09 HISTORY — DX: Personal history of other complications of pregnancy, childbirth and the puerperium: Z87.59

## 2016-06-09 MED ORDER — PRENATAL MULTIVITAMIN CH: 1.0000 | ORAL_TABLET | Freq: Every day | ORAL | 12 refills | Status: DC

## 2016-06-09 NOTE — Patient Instructions (Signed)
Third Trimester of Pregnancy The third trimester is from week 29 through week 40 (months 7 through 9). The third trimester is a time when the unborn baby (fetus) is growing rapidly. At the end of the ninth month, the fetus is about 20 inches in length and weighs 6-10 pounds. Body changes during your third trimester Your body goes through many changes during pregnancy. The changes vary from woman to woman. During the third trimester:  Your weight will continue to increase. You can expect to gain 25-35 pounds (11-16 kg) by the end of the pregnancy.  You may begin to get stretch marks on your hips, abdomen, and breasts.  You may urinate more often because the fetus is moving lower into your pelvis and pressing on your bladder.  You may develop or continue to have heartburn. This is caused by increased hormones that slow down muscles in the digestive tract.  You may develop or continue to have constipation because increased hormones slow digestion and cause the muscles that push waste through your intestines to relax.  You may develop hemorrhoids. These are swollen veins (varicose veins) in the rectum that can itch or be painful.  You may develop swollen, bulging veins (varicose veins) in your legs.  You may have increased body aches in the pelvis, back, or thighs. This is due to weight gain and increased hormones that are relaxing your joints.  You may have changes in your hair. These can include thickening of your hair, rapid growth, and changes in texture. Some women also have hair loss during or after pregnancy, or hair that feels dry or thin. Your hair will most likely return to normal after your baby is born.  Your breasts will continue to grow and they will continue to become tender. A yellow fluid (colostrum) may leak from your breasts. This is the first milk you are producing for your baby.  Your belly button may stick out.  You may notice more swelling in your hands, face, or  ankles.  You may have increased tingling or numbness in your hands, arms, and legs. The skin on your belly may also feel numb.  You may feel short of breath because of your expanding uterus.  You may have more problems sleeping. This can be caused by the size of your belly, increased need to urinate, and an increase in your body's metabolism.  You may notice the fetus "dropping," or moving lower in your abdomen.  You may have increased vaginal discharge.  Your cervix becomes thin and soft (effaced) near your due date. What to expect at prenatal visits You will have prenatal exams every 2 weeks until week 36. Then you will have weekly prenatal exams. During a routine prenatal visit:  You will be weighed to make sure you and the fetus are growing normally.  Your blood pressure will be taken.  Your abdomen will be measured to track your baby's growth.  The fetal heartbeat will be listened to.  Any test results from the previous visit will be discussed.  You may have a cervical check near your due date to see if you have effaced. At around 36 weeks, your health care provider will check your cervix. At the same time, your health care provider will also perform a test on the secretions of the vaginal tissue. This test is to determine if a type of bacteria, Group B streptococcus, is present. Your health care provider will explain this further. Your health care provider may ask you:    What your birth plan is.  How you are feeling.  If you are feeling the baby move.  If you have had any abnormal symptoms, such as leaking fluid, bleeding, severe headaches, or abdominal cramping.  If you are using any tobacco products, including cigarettes, chewing tobacco, and electronic cigarettes.  If you have any questions. Other tests or screenings that may be performed during your third trimester include:  Blood tests that check for low iron levels (anemia).  Fetal testing to check the health,  activity level, and growth of the fetus. Testing is done if you have certain medical conditions or if there are problems during the pregnancy.  Nonstress test (NST). This test checks the health of your baby to make sure there are no signs of problems, such as the baby not getting enough oxygen. During this test, a belt is placed around your belly. The baby is made to move, and its heart rate is monitored during movement. What is false labor? False labor is a condition in which you feel small, irregular tightenings of the muscles in the womb (contractions) that eventually go away. These are called Braxton Hicks contractions. Contractions may last for hours, days, or even weeks before true labor sets in. If contractions come at regular intervals, become more frequent, increase in intensity, or become painful, you should see your health care provider. What are the signs of labor?  Abdominal cramps.  Regular contractions that start at 10 minutes apart and become stronger and more frequent with time.  Contractions that start on the top of the uterus and spread down to the lower abdomen and back.  Increased pelvic pressure and dull back pain.  A watery or bloody mucus discharge that comes from the vagina.  Leaking of amniotic fluid. This is also known as your "water breaking." It could be a slow trickle or a gush. Let your doctor know if it has a color or strange odor. If you have any of these signs, call your health care provider right away, even if it is before your due date. Follow these instructions at home: Eating and drinking  Continue to eat regular, healthy meals.  Do not eat:  Raw meat or meat spreads.  Unpasteurized milk or cheese.  Unpasteurized juice.  Store-made salad.  Refrigerated smoked seafood.  Hot dogs or deli meat, unless they are piping hot.  More than 6 ounces of albacore tuna a week.  Shark, swordfish, king mackerel, or tile fish.  Store-made salads.  Raw  sprouts, such as mung bean or alfalfa sprouts.  Take prenatal vitamins as told by your health care provider.  Take 1000 mg of calcium daily as told by your health care provider.  If you develop constipation:  Take over-the-counter or prescription medicines.  Drink enough fluid to keep your urine clear or pale yellow.  Eat foods that are high in fiber, such as fresh fruits and vegetables, whole grains, and beans.  Limit foods that are high in fat and processed sugars, such as fried and sweet foods. Activity  Exercise only as directed by your health care provider. Healthy pregnant women should aim for 2 hours and 30 minutes of moderate exercise per week. If you experience any pain or discomfort while exercising, stop.  Avoid heavy lifting.  Do not exercise in extreme heat or humidity, or at high altitudes.  Wear low-heel, comfortable shoes.  Practice good posture.  Do not travel far distances unless it is absolutely necessary and only with the approval   of your health care provider.  Wear your seat belt at all times while in a car, on a bus, or on a plane.  Take frequent breaks and rest with your legs elevated if you have leg cramps or low back pain.  Do not use hot tubs, steam rooms, or saunas.  You may continue to have sex unless your health care provider tells you otherwise. Lifestyle  Do not use any products that contain nicotine or tobacco, such as cigarettes and e-cigarettes. If you need help quitting, ask your health care provider.  Do not drink alcohol.  Do not use any medicinal herbs or unprescribed drugs. These chemicals affect the formation and growth of the baby.  If you develop varicose veins:  Wear support pantyhose or compression stockings as told by your healthcare provider.  Elevate your feet for 15 minutes, 3-4 times a day.  Wear a supportive maternity bra to help with breast tenderness. General instructions  Take over-the-counter and prescription  medicines only as told by your health care provider. There are medicines that are either safe or unsafe to take during pregnancy.  Take warm sitz baths to soothe any pain or discomfort caused by hemorrhoids. Use hemorrhoid cream or witch hazel if your health care provider approves.  Avoid cat litter boxes and soil used by cats. These carry germs that can cause birth defects in the baby. If you have a cat, ask someone to clean the litter box for you.  To prepare for the arrival of your baby:  Take prenatal classes to understand, practice, and ask questions about the labor and delivery.  Make a trial run to the hospital.  Visit the hospital and tour the maternity area.  Arrange for maternity or paternity leave through employers.  Arrange for family and friends to take care of pets while you are in the hospital.  Purchase a rear-facing car seat and make sure you know how to install it in your car.  Pack your hospital bag.  Prepare the baby's nursery. Make sure to remove all pillows and stuffed animals from the baby's crib to prevent suffocation.  Visit your dentist if you have not gone during your pregnancy. Use a soft toothbrush to brush your teeth and be gentle when you floss.  Keep all prenatal follow-up visits as told by your health care provider. This is important. Contact a health care provider if:  You are unsure if you are in labor or if your water has broken.  You become dizzy.  You have mild pelvic cramps, pelvic pressure, or nagging pain in your abdominal area.  You have lower back pain.  You have persistent nausea, vomiting, or diarrhea.  You have an unusual or bad smelling vaginal discharge.  You have pain when you urinate. Get help right away if:  You have a fever.  You are leaking fluid from your vagina.  You have spotting or bleeding from your vagina.  You have severe abdominal pain or cramping.  You have rapid weight loss or weight gain.  You have  shortness of breath with chest pain.  You notice sudden or extreme swelling of your face, hands, ankles, feet, or legs.  Your baby makes fewer than 10 movements in 2 hours.  You have severe headaches that do not go away with medicine.  You have vision changes. Summary  The third trimester is from week 29 through week 40, months 7 through 9. The third trimester is a time when the unborn baby (fetus)   is growing rapidly.  During the third trimester, your discomfort may increase as you and your baby continue to gain weight. You may have abdominal, leg, and back pain, sleeping problems, and an increased need to urinate.  During the third trimester your breasts will keep growing and they will continue to become tender. A yellow fluid (colostrum) may leak from your breasts. This is the first milk you are producing for your baby.  False labor is a condition in which you feel small, irregular tightenings of the muscles in the womb (contractions) that eventually go away. These are called Braxton Hicks contractions. Contractions may last for hours, days, or even weeks before true labor sets in.  Signs of labor can include: abdominal cramps; regular contractions that start at 10 minutes apart and become stronger and more frequent with time; watery or bloody mucus discharge that comes from the vagina; increased pelvic pressure and dull back pain; and leaking of amniotic fluid. This information is not intended to replace advice given to you by your health care provider. Make sure you discuss any questions you have with your health care provider. Document Released: 06/01/2001 Document Revised: 11/13/2015 Document Reviewed: 08/08/2012 Elsevier Interactive Patient Education  2017 [employer]  

## 2016-06-09 NOTE — Progress Notes (Signed)
Pt c/o of n/v

## 2016-06-09 NOTE — Progress Notes (Signed)
Subjective:  Lori Lori Lloyd is a 20 y.o. G4P1021 at 2839w2d being seen today for ongoing prenatal care. She started her prenatal care in [place] but transferred to here due to traveling. H/O placenta previa with this pregnancy. No bleeding thus far during this pregnancy TSVD in the past.  She is currently monitored for the following issues for this high-risk pregnancy and has Depressive disorder; Supervision of high-risk pregnancy; and Placenta previa on her problem list.  Patient reports no complaints.  Contractions: Not present. Vag. Bleeding: None.  Movement: Present. Denies leaking of fluid.   The following portions of the patient's history were reviewed and updated as appropriate: allergies, current medications, past family history, past medical history, past social history, past surgical history and problem list. Problem list updated.  Objective:   Vitals:   06/09/16 1544  BP: 103/63  Pulse: 92  Weight: 184 lb 9.6 oz (83.7 kg)    Fetal Status: Fetal Heart Rate (bpm): 147   Movement: Present     General:  Alert, oriented and cooperative. Patient is in no acute distress.  Skin: Skin is warm and dry. No rash noted.   Cardiovascular: Normal heart rate noted  Respiratory: Normal respiratory effort, no problems with respiration noted  Abdomen: Soft, gravid, appropriate for gestational age. Pain/Pressure: Absent     Pelvic:  Cervical exam deferred        Extremities: Normal range of motion.  Edema: None  Mental Status: Normal mood and affect. Normal behavior. Normal judgment and thought content.   Urinalysis:      Assessment and Plan:  Pregnancy: G4P1021 at 6639w2d  1. Placenta previa in third trimester Pt instructed on pelvic rest - US MFM OB COMP + 14 WK; Future  Preterm labor symptoms and general obstetric precautions including but not limited to vaginal bleeding, contractions, leaking of fluid and fetal movement were reviewed in detail with the patient. Please refer to After Visit  Summary for other counseling recommendations.  Return in about 2 weeks (around 06/23/2016).   Hermina StaggersMichael L Ervin, MD

## 2016-06-11 ENCOUNTER — Encounter: Payer: Self-pay | Admitting: *Deleted

## 2016-06-13 ENCOUNTER — Inpatient Hospital Stay (HOSPITAL_COMMUNITY): Payer: Medicaid Other

## 2016-06-13 ENCOUNTER — Inpatient Hospital Stay (HOSPITAL_COMMUNITY)
Admission: AD | Admit: 2016-06-13 | Discharge: 2016-06-13 | Disposition: A | Discharge status: Home / Self Care | Payer: Medicaid Other | Source: Ambulatory Visit | Attending: Obstetrics and Gynecology | Admitting: Obstetrics and Gynecology

## 2016-06-13 ENCOUNTER — Encounter (HOSPITAL_COMMUNITY): Payer: Self-pay

## 2016-06-13 DIAGNOSIS — O4400 Placenta previa specified as without hemorrhage, unspecified trimester: Secondary | ICD-10-CM

## 2016-06-13 DIAGNOSIS — N760 Acute vaginitis: Secondary | ICD-10-CM

## 2016-06-13 DIAGNOSIS — Z3A31 31 weeks gestation of pregnancy: Secondary | ICD-10-CM | POA: Insufficient documentation

## 2016-06-13 DIAGNOSIS — O4443 Low lying placenta NOS or without hemorrhage, third trimester: Secondary | ICD-10-CM

## 2016-06-13 DIAGNOSIS — O4693 Antepartum hemorrhage, unspecified, third trimester: Secondary | ICD-10-CM

## 2016-06-13 DIAGNOSIS — O4690 Antepartum hemorrhage, unspecified, unspecified trimester: Secondary | ICD-10-CM

## 2016-06-13 DIAGNOSIS — O4403 Placenta previa specified as without hemorrhage, third trimester: Secondary | ICD-10-CM | POA: Insufficient documentation

## 2016-06-13 DIAGNOSIS — B9689 Other specified bacterial agents as the cause of diseases classified elsewhere: Secondary | ICD-10-CM

## 2016-06-13 LAB — URINALYSIS, ROUTINE W REFLEX MICROSCOPIC
Bilirubin Urine: NEGATIVE
Glucose, UA: NEGATIVE mg/dL
KETONES UR: NEGATIVE mg/dL
Nitrite: NEGATIVE
PH: 7 (ref 5.0–8.0)
Protein, ur: NEGATIVE mg/dL
SPECIFIC GRAVITY, URINE: 1.008 (ref 1.005–1.030)

## 2016-06-13 LAB — WET PREP, GENITAL
SPERM: NONE SEEN
TRICH WET PREP: NONE SEEN
Yeast Wet Prep HPF POC: NONE SEEN

## 2016-06-13 MED ORDER — METRONIDAZOLE 500 MG PO TABS: 500.0000 mg | ORAL_TABLET | Freq: Two times a day (BID) | ORAL | 0 refills | Status: AC

## 2016-06-13 NOTE — MAU Note (Signed)
When she woke up she was bleeding (around 9).  "Had a puddle of moisture in her panties yesterday", kind of felt wetness all day. Checked on the internet, didn't think is was a concern.  Denies pain

## 2016-06-13 NOTE — MAU Provider Note (Signed)
Chief Complaint:  Vaginal Bleeding and possible rupture of membranes   HPI: Lori PicketMekierra A Lloyd is a 20 y.o. [id number] at 6444w6d who presents to maternity admissions reporting vaginal bleeding.  Patient noted some vaginal bleeding this morning after waking, about 9am. Patient mentioned she has known placenta previa this pregnancy, has not had a bleed during this pregnancy. She states the bleeding was just small streaks of blood on her underwear and when she wiped. She felt that her underwear felt wet, not soaked, and has not been continually leaking fluid since. She denies any pain. Denies recent intercourse. Denies contractions. Good fetal movement.   Pregnancy Course:   Past Medical History: Past Medical History:  Diagnosis Date  . Asthma   . Asthma   . Depression   . Eczema   . Fracture    ring finger right hand    Past obstetric history: OB History  Gravida Para Term Preterm AB Living  4 1 1  0 2 1  SAB TAB Ectopic Multiple Live Births  0 2 0 0 1    # Outcome Date GA Lbr Len/2nd Weight Sex Delivery Anes PTL Lv  4 Current           3 TAB 2015          2 TAB 05/2013          1 Term 10/03/12 7372w5d 06:03 / 00:22 7 lb 12.9 oz (3.541 kg) F Vag-Spont EPI  LIV      Past Surgical History: Past Surgical History:  Procedure Laterality Date  . INDUCED ABORTION    . WISDOM TOOTH EXTRACTION       Family History: Family History  Problem Relation Age of Onset  . Diabetes Maternal Grandmother   . Hypertension Maternal Grandmother   . Hearing loss Neg Hx     Social History: Social History  Substance Use Topics  . Smoking status: Former Smoker    Packs/day: 0.25    Years: 0.50    Types: Cigarettes  . Smokeless tobacco: Never Used     Comment: with preg  . Alcohol use No    Allergies:  Allergies  Allergen Reactions  . Shellfish Allergy Anaphylaxis    Meds:  Prescriptions Prior to Admission  Medication Sig Dispense Refill Last Dose  . albuterol (PROVENTIL HFA;VENTOLIN  HFA) 108 (90 Base) MCG/ACT inhaler Inhale 1-2 puffs into the lungs every 6 (six) hours as needed for wheezing or shortness of breath. (Patient not taking: Reported on 06/09/2016) 1 Inhaler 1 Not Taking  . cyclobenzaprine (FLEXERIL) 10 MG tablet Take 1 tablet (10 mg total) by mouth 2 (two) times daily. (Patient not taking: Reported on 06/09/2016) 30 tablet 0 Not Taking  . Prenatal Vit-Fe Fumarate-FA (PRENATAL MULTIVITAMIN) TABS tablet Take 1 tablet by mouth daily at 12 noon. 30 tablet 12   . triamcinolone (KENALOG) 0.025 % ointment Apply 1 application topically daily.  1 Not Taking    I have reviewed patient's Past Medical Hx, Surgical Hx, Family Hx, Social Hx, medications and allergies.   ROS:  A comprehensive ROS was negative except per HPI.    Physical Exam  Patient Vitals for the past 24 hrs:  BP Temp Pulse Resp Weight  06/13/16 1216 (!) 100/51 98.2 F (36.8 C) 114 16 184 lb 9.6 oz (83.7 kg)   Constitutional: Well-developed, well-nourished female in no acute distress.  Cardiovascular: normal rate Respiratory: normal effort GI: Abd soft, non-tender, gravid appropriate for gestational age. Pos BS x 4 MS:  Extremities nontender, no edema, normal ROM Neurologic: Alert and oriented x 4.  GU: Neg CVAT. Pelvic: NEFG, physiologic discharge, NO NEW OR OLD BLOOD NOTED, cervix clean.  SVE NOT PERFORMED 2/2 LOW LYING PLACENTA  FHT:  Baseline 130 , moderate variability, accelerations present, no decelerations Contractions: None   Labs: No results found for this or any previous visit (from the past 24 hour(s)).  Imaging:  OB Transvaginal US  MAU Course: SSE - no bleeding, no pooling on exam TVUS - Anterior low lying placenta, 1.5 cm from os   MDM: Plan of care reviewed with patient, including labs and tests ordered and medical treatment.  Reviewed placenta previa with the patient, bleeding precautions, and to have pelvic rest for duration of pregnancy until proven it has resolved. No  bleeding noted on exam today, encouraged for immediate follow up if bleeding occurs again.    Assessment: 1. Placenta previa   2. Vaginal bleeding in pregnancy     Plan: Discharge home in stable condition.  Preterm Labor precautions and fetal kick counts Bleeding precautions Pelvic rest OB follow up with repeat US    Allergies as of 06/13/2016      Reactions   Shellfish Allergy Anaphylaxis      Medication List    STOP taking these medications   cyclobenzaprine 10 MG tablet Commonly known as:  FLEXERIL     TAKE these medications   albuterol 108 (90 Base) MCG/ACT inhaler Commonly known as:  PROVENTIL HFA;VENTOLIN HFA Inhale 1-2 puffs into the lungs every 6 (six) hours as needed for wheezing or shortness of breath.   prenatal multivitamin Tabs tablet Take 1 tablet by mouth daily at 12 noon.     ASK your doctor about these medications   metroNIDAZOLE 500 MG tablet Commonly known as:  FLAGYL Take 1 tablet (500 mg total) by mouth 2 (two) times daily. Ask about: Should I take this medication?       Jen MowElizabeth Kionte Baumgardner, DO OB Fellow [facility] 06/13/2016 12:54 PM

## 2016-06-13 NOTE — Discharge Instructions (Signed)
Placenta Previa Placenta previa is a condition in which the placenta implants in the lower part of the uterus in pregnant women. The placenta either partially or completely covers the opening to the cervix. This is a problem because the baby must pass through the cervix during delivery. There are three types of placenta previa:  Marginal placenta previa. The placenta reaches within an inch (2.5 cm) of the cervical opening but does not cover it.  Partial placenta previa. The placenta covers part of the cervical opening.  Complete placenta previa. The placenta covers the entire cervical opening. If the previa is marginal or partial and it is diagnosed in the first half of pregnancy, the placenta may move into a normal position as the pregnancy progresses and may no longer cover the cervix. It is important to keep all prenatal visits with your health care provider so you can be more closely monitored. What are the causes? The cause of this condition is not known. What increases the risk? This condition is more likely to develop in women who:  Are carrying more than one baby (multiples).  Have an abnormally shaped uterus.  Have scars on the lining of the uterus.  Have had surgeries involving the uterus, such as a cesarean delivery.  Have delivered a baby before.  Have a history of placenta previa.  Have smoked or used cocaine during pregnancy.  Are age 35 or older during pregnancy. What are the signs or symptoms? The main symptom of this condition is sudden, painless vaginal bleeding during the second half of pregnancy. The amount of bleeding can be very light at first, and it usually stops on its own. Heavier bleeding episodes may also happen. Some women with placenta previa may have no bleeding at all. How is this diagnosed?  This condition is diagnosed:  From an ultrasound. This test uses sound waves to find where the placenta is located before you have any bleeding  episodes.  During a checkup after vaginal bleeding is noticed.  If you are diagnosed with a partial or complete previa, digital exams with fingers will generally be avoided. Your health care provider will still perform a speculum exam.  If you did not have an ultrasound during your pregnancy, placenta previa may not be diagnosed until bleeding occurs during labor. How is this treated? Treatment for this condition may include:  Decreased activity.  Bed rest at home or in the hospital.  Pelvic rest. Nothing is placed inside the vagina during pelvic rest. This means not having sex and not using tampons or douches.  A blood transfusion to replace blood that you have lost (maternal blood loss).  A cesarean delivery. This may be performed if:  The bleeding is heavy and cannot be controlled.  The placenta completely covers the cervix.  Medicines to stop premature labor or to help the baby's lungs to mature. This treatment may be used if you need delivery before your pregnancy is full-term. Your treatment will be decided based on:  How much you are bleeding, or whether the bleeding has stopped.  How far along you are in your pregnancy.  The condition of your baby.  The type of placenta previa that you have. Follow these instructions at home:  Get plenty of rest and lessen activity as told by your health care provider.  Stay on bed rest for as long as told by your health care provider.  Do not have sex, use tampons, use a douche, or place anything inside of your   vagina if your health care provider recommended pelvic rest.  Take over-the-counter and prescription medicines as told by your health care provider.  Keep all follow-up visits as told by your health care provider. This is important. Get help right away if:  You have vaginal bleeding, even if in small amounts and even if you have no pain.  You have cramping or regular contractions.  You have pain in your abdomen or  your lower back.  You have a feeling of increased pressure in your pelvis.  You have increased watery or bloody mucus from the vagina. This information is not intended to replace advice given to you by your health care provider. Make sure you discuss any questions you have with your health care provider. Document Released: 06/07/2005 Document Revised: 02/25/2016 Document Reviewed: 12/20/2015 Elsevier Interactive Patient Education  2017 [employer]vier Inc.   Bacterial Vaginosis Bacterial vaginosis is a vaginal infection that occurs when the normal balance of bacteria in the vagina is disrupted. It results from an overgrowth of certain bacteria. This is the most common vaginal infection among women ages 6615-44. Because bacterial vaginosis increases your risk for STIs (sexually transmitted infections), getting treated can help reduce your risk for chlamydia, gonorrhea, herpes, and HIV (human immunodeficiency virus). Treatment is also important for preventing complications in pregnant women, because this condition can cause an early (premature) delivery. What are the causes? This condition is caused by an increase in harmful bacteria that are normally present in small amounts in the vagina. However, the reason that the condition develops is not fully understood. What increases the risk? The following factors may make you more likely to develop this condition:  Having a new sexual partner or multiple sexual partners.  Having unprotected sex.  Douching.  Having an intrauterine device (IUD).  Smoking.  Drug and alcohol abuse.  Taking certain antibiotic medicines.  Being pregnant. You cannot get bacterial vaginosis from toilet seats, bedding, swimming pools, or contact with objects around you. What are the signs or symptoms? Symptoms of this condition include:  Grey or white vaginal discharge. The discharge can also be watery or foamy.  A fish-like odor with discharge, especially after  sexual intercourse or during menstruation.  Itching in and around the vagina.  Burning or pain with urination. Some women with bacterial vaginosis have no signs or symptoms. How is this diagnosed? This condition is diagnosed based on:  Your medical history.  A physical exam of the vagina.  Testing a sample of vaginal fluid under a microscope to look for a large amount of bad bacteria or abnormal cells. Your health care provider may use a cotton swab or a small wooden spatula to collect the sample. How is this treated? This condition is treated with antibiotics. These may be given as a pill, a vaginal cream, or a medicine that is put into the vagina (suppository). If the condition comes back after treatment, a second round of antibiotics may be needed. Follow these instructions at home: Medicines  Take over-the-counter and prescription medicines only as told by your health care provider.  Take or use your antibiotic as told by your health care provider. Do not stop taking or using the antibiotic even if you start to feel better. General instructions  If you have a female sexual partner, tell her that you have a vaginal infection. She should see her health care provider and be treated if she has symptoms. If you have a female sexual partner, he does not need treatment.  During treatment:  Avoid sexual activity until you finish treatment.  Do not douche.  Avoid alcohol as directed by your health care provider.  Avoid breastfeeding as directed by your health care provider.  Drink enough water and fluids to keep your urine clear or pale yellow.  Keep the area around your vagina and rectum clean.  Wash the area daily with warm water.  Wipe yourself from front to back after using the toilet.  Keep all follow-up visits as told by your health care provider. This is important. How is this prevented?  Do not douche.  Wash the outside of your vagina with warm water only.  Use  protection when having sex. This includes latex condoms and dental dams.  Limit how many sexual partners you have. To help prevent bacterial vaginosis, it is best to have sex with just one partner (monogamous).  Make sure you and your sexual partner are tested for STIs.  Wear cotton or cotton-lined underwear.  Avoid wearing tight pants and pantyhose, especially during summer.  Limit the amount of alcohol that you drink.  Do not use any products that contain nicotine or tobacco, such as cigarettes and e-cigarettes. If you need help quitting, ask your health care provider.  Do not use illegal drugs. Where to find more information:  Centers for Disease Control and Prevention: SolutionApps.co.zawww.cdc.gov/std  American Sexual Health Association (ASHA): www.ashastd.org  U.S. Department of Health and Health and safety inspectorHuman Services, Office on Women's Health: ConventionalMedicines.siwww.womenshealth.gov/ or http://www.anderson-williamson.info/https://www.womenshealth.gov/a-z-topics/bacterial-vaginosis Contact a health care provider if:  Your symptoms do not improve, even after treatment.  You have more discharge or pain when urinating.  You have a fever.  You have pain in your abdomen.  You have pain during sex.  You have vaginal bleeding between periods. Summary  Bacterial vaginosis is a vaginal infection that occurs when the normal balance of bacteria in the vagina is disrupted.  Because bacterial vaginosis increases your risk for STIs (sexually transmitted infections), getting treated can help reduce your risk for chlamydia, gonorrhea, herpes, and HIV (human immunodeficiency virus). Treatment is also important for preventing complications in pregnant women, because the condition can cause an early (premature) delivery.  This condition is treated with antibiotic medicines. These may be given as a pill, a vaginal cream, or a medicine that is put into the vagina (suppository). This information is not intended to replace advice given to you by your health care provider. Make  sure you discuss any questions you have with your health care provider. Document Released: 06/07/2005 Document Revised: 02/21/2016 Document Reviewed: 02/21/2016 Elsevier Interactive Patient Education  2017 [employer]

## 2016-06-15 ENCOUNTER — Encounter: Payer: Self-pay | Admitting: Obstetrics and Gynecology

## 2016-06-15 DIAGNOSIS — O4440 Low lying placenta NOS or without hemorrhage, unspecified trimester: Secondary | ICD-10-CM | POA: Insufficient documentation

## 2016-06-15 LAB — GC/CHLAMYDIA PROBE AMP (~~LOC~~) NOT AT ARMC
Chlamydia: NEGATIVE
NEISSERIA GONORRHEA: NEGATIVE

## 2016-06-18 ENCOUNTER — Encounter (HOSPITAL_COMMUNITY): Payer: Self-pay | Admitting: Student

## 2016-06-18 ENCOUNTER — Inpatient Hospital Stay (HOSPITAL_COMMUNITY)
Admission: AD | Admit: 2016-06-18 | Discharge: 2016-06-18 | Disposition: A | Discharge status: Home / Self Care | Payer: Medicaid Other | Source: Ambulatory Visit | Attending: Obstetrics and Gynecology | Admitting: Obstetrics and Gynecology

## 2016-06-18 DIAGNOSIS — K5900 Constipation, unspecified: Secondary | ICD-10-CM | POA: Diagnosis not present

## 2016-06-18 DIAGNOSIS — O4443 Low lying placenta NOS or without hemorrhage, third trimester: Secondary | ICD-10-CM | POA: Diagnosis not present

## 2016-06-18 DIAGNOSIS — Z87891 Personal history of nicotine dependence: Secondary | ICD-10-CM | POA: Insufficient documentation

## 2016-06-18 DIAGNOSIS — O99613 Diseases of the digestive system complicating pregnancy, third trimester: Secondary | ICD-10-CM | POA: Insufficient documentation

## 2016-06-18 DIAGNOSIS — R109 Unspecified abdominal pain: Secondary | ICD-10-CM | POA: Diagnosis not present

## 2016-06-18 DIAGNOSIS — O26893 Other specified pregnancy related conditions, third trimester: Secondary | ICD-10-CM

## 2016-06-18 DIAGNOSIS — Z3A32 32 weeks gestation of pregnancy: Secondary | ICD-10-CM | POA: Insufficient documentation

## 2016-06-18 DIAGNOSIS — Z91013 Allergy to seafood: Secondary | ICD-10-CM | POA: Insufficient documentation

## 2016-06-18 LAB — WET PREP, GENITAL
SPERM: NONE SEEN
Trich, Wet Prep: NONE SEEN
Yeast Wet Prep HPF POC: NONE SEEN

## 2016-06-18 LAB — CBC
HCT: 32.8 % — ABNORMAL LOW (ref 36.0–46.0)
Hemoglobin: 10.7 g/dL — ABNORMAL LOW (ref 12.0–15.0)
MCH: 29.2 pg (ref 26.0–34.0)
MCHC: 32.6 g/dL (ref 30.0–36.0)
MCV: 89.4 fL (ref 78.0–100.0)
Platelets: 199 10*3/uL (ref 150–400)
RBC: 3.67 MIL/uL — AB (ref 3.87–5.11)
RDW: 13.6 % (ref 11.5–15.5)
WBC: 9.3 10*3/uL (ref 4.0–10.5)

## 2016-06-18 LAB — RAPID URINE DRUG SCREEN, HOSP PERFORMED
AMPHETAMINES: NOT DETECTED
BENZODIAZEPINES: NOT DETECTED
Barbiturates: NOT DETECTED
Cocaine: NOT DETECTED
OPIATES: NOT DETECTED
Tetrahydrocannabinol: POSITIVE — AB

## 2016-06-18 LAB — URINALYSIS, ROUTINE W REFLEX MICROSCOPIC
BILIRUBIN URINE: NEGATIVE
Glucose, UA: NEGATIVE mg/dL
Hgb urine dipstick: NEGATIVE
Ketones, ur: 5 mg/dL — AB
LEUKOCYTES UA: NEGATIVE
NITRITE: NEGATIVE
PH: 6 (ref 5.0–8.0)
Protein, ur: NEGATIVE mg/dL
SPECIFIC GRAVITY, URINE: 1.016 (ref 1.005–1.030)

## 2016-06-18 LAB — FETAL FIBRONECTIN: FETAL FIBRONECTIN: NEGATIVE

## 2016-06-18 MED ORDER — DOCUSATE SODIUM 100 MG PO CAPS: 100.0000 mg | ORAL_CAPSULE | Freq: Two times a day (BID) | ORAL | 2 refills | Status: DC | PRN

## 2016-06-18 MED ORDER — POLYETHYLENE GLYCOL 3350 17 GM/SCOOP PO POWD
17.0000 g | Freq: Every day | ORAL | 0 refills | Status: DC
Start: 2016-06-18 — End: 2021-02-07

## 2016-06-18 MED ORDER — LACTATED RINGERS IV BOLUS (SEPSIS)
1000.0000 mL | Freq: Once | INTRAVENOUS | Status: AC
  Administered 2016-06-18: 1000 mL via INTRAVENOUS

## 2016-06-18 MED ORDER — FENTANYL CITRATE (PF) 100 MCG/2ML IJ SOLN
100.0000 ug | Freq: Once | INTRAMUSCULAR | Status: DC
  Filled 2016-06-18: qty 2

## 2016-06-18 NOTE — MAU Provider Note (Signed)
Chief Complaint:  Contractions   None     HPI: Lori Lloyd is a 20 y.o. [id number] at 7135w4d who presents to maternity admissions via EMS for abdominal pain described as intermittent severe cramping.  She reports the pain started 1 hour prior to arrival in MAU. She has not taken any medications or tried any treatments.  The pain is gradually worsening.  Her pain is associated with nausea and vomiting x 2-3.  She has a low-lying placenta in this pregnancy. She denies bleeding.  [facility] and had one initial visit on 12/20. She has hx of one term vaginal delivery.   She does report recent constipation with 4-5 days between bowel movements.  Last BM yesterday but it was hard and uncomfortable. She reports good fetal movement, denies LOF, vaginal bleeding, vaginal itching/burning, urinary symptoms, h/a, dizziness, or fever/chills.    HPI  Past Medical History: Past Medical History:  Diagnosis Date  . Asthma   . Depression   . Eczema   . Fracture    ring finger right hand    Past obstetric history: OB History  Gravida Para Term Preterm AB Living  4 1 1  0 2 1  SAB TAB Ectopic Multiple Live Births  0 2 0 0 1    # Outcome Date GA Lbr Len/2nd Weight Sex Delivery Anes PTL Lv  4 Current           3 TAB 2015          2 TAB 05/2013          1 Term 10/03/12 1182w5d 06:03 / 00:22 7 lb 12.9 oz (3.541 kg) F Vag-Spont EPI  LIV      Past Surgical History: Past Surgical History:  Procedure Laterality Date  . INDUCED ABORTION    . WISDOM TOOTH EXTRACTION      Family History: Family History  Problem Relation Age of Onset  . Diabetes Maternal Grandmother   . Hypertension Maternal Grandmother   . Hearing loss Neg Hx     Social History: Social History  Substance Use Topics  . Smoking status: Former Smoker    Packs/day: 0.25    Years: 0.50    Types: Cigarettes  . Smokeless tobacco: Never Used     Comment: with preg  . Alcohol use  No    Allergies:  Allergies  Allergen Reactions  . Shellfish Allergy Anaphylaxis    Meds:  No prescriptions prior to admission.    ROS:  Review of Systems  Constitutional: Negative for chills, fatigue and fever.  HENT: Negative for sinus pressure.   Eyes: Negative for photophobia.  Respiratory: Negative for shortness of breath.   Cardiovascular: Negative for chest pain.  Gastrointestinal: Positive for abdominal pain, nausea and vomiting. Negative for constipation and diarrhea.  Genitourinary: Positive for pelvic pain. Negative for difficulty urinating, dysuria, flank pain, frequency and vaginal bleeding.  Musculoskeletal: Negative for neck pain.  Neurological: Negative for dizziness, weakness and headaches.  Psychiatric/Behavioral: Negative.      I have reviewed patient's Past Medical Hx, Surgical Hx, Family Hx, Social Hx, medications and allergies.   Physical Exam   Patient Vitals for the past 24 hrs:  BP Temp Temp src Pulse Resp  06/18/16 1506 (!) 90/45 - - 97 16  06/18/16 1414 (!) 92/44 - - 98 16  06/18/16 1233 (!) 95/45 97.5 F (36.4 C) Oral 98 16   Constitutional: Well-developed, well-nourished  female in moderate distress.  Cardiovascular: normal rate Respiratory: normal effort GI: Abd soft, non-tender, gravid appropriate for gestational age.  MS: Extremities nontender, no edema, normal ROM Neurologic: Alert and oriented x 4.  GU: Neg CVAT.  PELVIC EXAM: Gentle SSE performed and cervix visually closed and thick. No bleeding visualized. FFN collected.  Moderate yellow thick vaginal discharge so wet prep and GCC collected.  No digital exam performed.       FHT:  Baseline 135 , moderate variability positive accels, no decelerations Contractions: 1-2 Q hour, mild to palpation   Labs: Results for orders placed or performed during the hospital encounter of 06/18/16 (from the past 24 hour(s))  Fetal fibronectin     Status: None   Collection Time: 06/18/16 12:05  PM  Result Value Ref Range   Fetal Fibronectin NEGATIVE NEGATIVE  Wet prep, genital     Status: Abnormal   Collection Time: 06/18/16 12:05 PM  Result Value Ref Range   Yeast Wet Prep HPF POC NONE SEEN NONE SEEN   Trich, Wet Prep NONE SEEN NONE SEEN   Clue Cells Wet Prep HPF POC PRESENT (A) NONE SEEN   WBC, Wet Prep HPF POC FEW (A) NONE SEEN   Sperm NONE SEEN   CBC     Status: Abnormal   Collection Time: 06/18/16 12:45 PM  Result Value Ref Range   WBC 9.3 4.0 - 10.5 K/uL   RBC 3.67 (L) 3.87 - 5.11 MIL/uL   Hemoglobin 10.7 (L) 12.0 - 15.0 g/dL   HCT 19.132.8 (L) 47.836.0 - 29.546.0 %   MCV 89.4 78.0 - 100.0 fL   MCH 29.2 26.0 - 34.0 pg   MCHC 32.6 30.0 - 36.0 g/dL   RDW 62.113.6 30.811.5 - 65.715.5 %   Platelets 199 150 - 400 K/uL  Rapid urine drug screen (hospital performed)     Status: Abnormal   Collection Time: 06/18/16  1:33 PM  Result Value Ref Range   Opiates NONE DETECTED NONE DETECTED   Cocaine NONE DETECTED NONE DETECTED   Benzodiazepines NONE DETECTED NONE DETECTED   Amphetamines NONE DETECTED NONE DETECTED   Tetrahydrocannabinol POSITIVE (A) NONE DETECTED   Barbiturates NONE DETECTED NONE DETECTED  Urinalysis, Routine w reflex microscopic     Status: Abnormal   Collection Time: 06/18/16  1:33 PM  Result Value Ref Range   Color, Urine YELLOW YELLOW   APPearance HAZY (A) CLEAR   Specific Gravity, Urine 1.016 1.005 - 1.030   pH 6.0 5.0 - 8.0   Glucose, UA NEGATIVE NEGATIVE mg/dL   Hgb urine dipstick NEGATIVE NEGATIVE   Bilirubin Urine NEGATIVE NEGATIVE   Ketones, ur 5 (A) NEGATIVE mg/dL   Protein, ur NEGATIVE NEGATIVE mg/dL   Nitrite NEGATIVE NEGATIVE   Leukocytes, UA NEGATIVE NEGATIVE       MAU Course/MDM: I have ordered labs and reviewed results.  NST reviewed Pt pain resolved in MAU without treatment.  Consult Dr Alysia PennaErvin with presentation, exam findings and test results.  No evidence of preterm labor today and no bleeding or other concerns related to previa.  Discussed with pt  likely GI/constipation as source of pain.  Recommend increased PO fluids and fiber intake.  Rx for Colace and Miralax.   Pt to f/u at scheduled prenatal appointment on Wednesday. Pt stable at time of discharge.  Today's evaluation included a work-up for preterm labor which can be life-threatening for both mom and baby.  Assessment: 1. Abdominal pain during pregnancy in third trimester  2. Constipation during pregnancy in third trimester   3. Low lying placenta nos or without hemorrhage, third trimester     Plan: Discharge home Labor precautions and fetal kick counts Follow-up Information    [facility].   Specialty:  Obstetrics and Gynecology Why:  As scheduled on Wednesday. Return to MAU as needed for emergencies. Contact information: [address] [place] [address] [phone]         Allergies as of 06/18/2016      Reactions   Shellfish Allergy Anaphylaxis      Medication List    TAKE these medications   albuterol 108 (90 Base) MCG/ACT inhaler Commonly known as:  PROVENTIL HFA;VENTOLIN HFA Inhale 1-2 puffs into the lungs every 6 (six) hours as needed for wheezing or shortness of breath.   docusate sodium 100 MG capsule Commonly known as:  COLACE Take 1 capsule (100 mg total) by mouth 2 (two) times daily as needed.   metroNIDAZOLE 500 MG tablet Commonly known as:  FLAGYL Take 1 tablet (500 mg total) by mouth 2 (two) times daily.   polyethylene glycol powder powder Commonly known as:  GLYCOLAX/MIRALAX Take 17 g by mouth daily.   prenatal multivitamin Tabs tablet Take 1 tablet by mouth daily at 12 noon.       Sharen Counter Certified Nurse-Midwife 06/18/2016 7:35 PM

## 2016-06-22 LAB — GC/CHLAMYDIA PROBE AMP (~~LOC~~) NOT AT ARMC
CHLAMYDIA, DNA PROBE: NEGATIVE
NEISSERIA GONORRHEA: NEGATIVE

## 2016-06-23 ENCOUNTER — Ambulatory Visit (INDEPENDENT_AMBULATORY_CARE_PROVIDER_SITE_OTHER): Payer: Medicaid Other | Admitting: Advanced Practice Midwife

## 2016-06-23 ENCOUNTER — Encounter: Payer: Self-pay | Admitting: Advanced Practice Midwife

## 2016-06-23 VITALS — BP 101/56 | HR 83 | Wt 186.5 lb

## 2016-06-23 DIAGNOSIS — Z23 Encounter for immunization: Secondary | ICD-10-CM | POA: Diagnosis not present

## 2016-06-23 DIAGNOSIS — O0990 Supervision of high risk pregnancy, unspecified, unspecified trimester: Secondary | ICD-10-CM

## 2016-06-23 DIAGNOSIS — O4440 Low lying placenta NOS or without hemorrhage, unspecified trimester: Secondary | ICD-10-CM

## 2016-06-23 DIAGNOSIS — O4403 Placenta previa specified as without hemorrhage, third trimester: Secondary | ICD-10-CM

## 2016-06-23 MED ORDER — TETANUS-DIPHTH-ACELL PERTUSSIS 5-2.5-18.5 LF-MCG/0.5 IM SUSP
0.5000 mL | Freq: Once | INTRAMUSCULAR | Status: AC
  Administered 2016-06-23: 0.5 mL via INTRAMUSCULAR

## 2016-06-23 NOTE — Progress Notes (Signed)
   PRENATAL VISIT NOTE  Subjective:  Lori Lloyd is a [age] y.o. G4P1021 at 3853w2d being seen today for ongoing prenatal care.  She is currently monitored for the following issues for this high-risk pregnancy and has Depressive disorder; Supervision of high-risk pregnancy; Placenta previa; and Low-lying placenta on her problem list.  Patient reports no complaints.  Contractions: Not present. Vag. Bleeding: None.  Movement: Present. Denies leaking of fluid.   The following portions of the patient's history were reviewed and updated as appropriate: allergies, current medications, past family history, past medical history, past social history, past surgical history and problem list. Problem list updated.  Objective:   Vitals:   06/23/16 0835  BP: (!) 101/56  Pulse: 83  Weight: 186 lb 8 oz (84.6 kg)    Fetal Status: Fetal Heart Rate (bpm): 142 Fundal Height: 34 cm Movement: Present  Presentation: Vertex  General:  Alert, oriented and cooperative. Patient is in no acute distress.  Skin: Skin is warm and dry. No rash noted.   Cardiovascular: Normal heart rate noted  Respiratory: Normal respiratory effort, no problems with respiration noted  Abdomen: Soft, gravid, appropriate for gestational age. Pain/Pressure: Absent     Pelvic:  Cervical exam deferred        Extremities: Normal range of motion.  Edema: None  Mental Status: Normal mood and affect. Normal behavior. Normal judgment and thought content.   Assessment and Plan:  Pregnancy: G4P1021 at 3353w2d  1. Supervision of high risk pregnancy, antepartum - TDaP  2. Low-lying placenta - US 06/30/16  Preterm labor symptoms and general obstetric precautions including but not limited to vaginal bleeding, contractions, leaking of fluid and fetal movement were reviewed in detail with the patient. Please refer to After Visit Summary for other counseling recommendations.  Return in 2 weeks (on 07/07/2016).   Dorathy KinsmanVirginia Tiwanna Tuch, CNM

## 2016-06-23 NOTE — Patient Instructions (Addendum)
Third Trimester of Pregnancy The third trimester is from week 29 through week 40 (months 7 through 9). The third trimester is a time when the unborn baby (fetus) is growing rapidly. At the end of the ninth month, the fetus is about 20 inches in length and weighs 6-10 pounds. Body changes during your third trimester Your body goes through many changes during pregnancy. The changes vary from woman to woman. During the third trimester:  Your weight will continue to increase. You can expect to gain 25-35 pounds (11-16 kg) by the end of the pregnancy.  You may begin to get stretch marks on your hips, abdomen, and breasts.  You may urinate more often because the fetus is moving lower into your pelvis and pressing on your bladder.  You may develop or continue to have heartburn. This is caused by increased hormones that slow down muscles in the digestive tract.  You may develop or continue to have constipation because increased hormones slow digestion and cause the muscles that push waste through your intestines to relax.  You may develop hemorrhoids. These are swollen veins (varicose veins) in the rectum that can itch or be painful.  You may develop swollen, bulging veins (varicose veins) in your legs.  You may have increased body aches in the pelvis, back, or thighs. This is due to weight gain and increased hormones that are relaxing your joints.  You may have changes in your hair. These can include thickening of your hair, rapid growth, and changes in texture. Some women also have hair loss during or after pregnancy, or hair that feels dry or thin. Your hair will most likely return to normal after your baby is born.  Your breasts will continue to grow and they will continue to become tender. A yellow fluid (colostrum) may leak from your breasts. This is the first milk you are producing for your baby.  Your belly button may stick out.  You may notice more swelling in your hands, face, or  ankles.  You may have increased tingling or numbness in your hands, arms, and legs. The skin on your belly may also feel numb.  You may feel short of breath because of your expanding uterus.  You may have more problems sleeping. This can be caused by the size of your belly, increased need to urinate, and an increase in your body's metabolism.  You may notice the fetus "dropping," or moving lower in your abdomen.  You may have increased vaginal discharge.  Your cervix becomes thin and soft (effaced) near your due date. What to expect at prenatal visits You will have prenatal exams every 2 weeks until week 36. Then you will have weekly prenatal exams. During a routine prenatal visit:  You will be weighed to make sure you and the fetus are growing normally.  Your blood pressure will be taken.  Your abdomen will be measured to track your baby's growth.  The fetal heartbeat will be listened to.  Any test results from the previous visit will be discussed.  You may have a cervical check near your due date to see if you have effaced. At around 36 weeks, your health care provider will check your cervix. At the same time, your health care provider will also perform a test on the secretions of the vaginal tissue. This test is to determine if a type of bacteria, Group B streptococcus, is present. Your health care provider will explain this further. Your health care provider may ask you:    What your birth plan is.  How you are feeling.  If you are feeling the baby move.  If you have had any abnormal symptoms, such as leaking fluid, bleeding, severe headaches, or abdominal cramping.  If you are using any tobacco products, including cigarettes, chewing tobacco, and electronic cigarettes.  If you have any questions. Other tests or screenings that may be performed during your third trimester include:  Blood tests that check for low iron levels (anemia).  Fetal testing to check the health,  activity level, and growth of the fetus. Testing is done if you have certain medical conditions or if there are problems during the pregnancy.  Nonstress test (NST). This test checks the health of your baby to make sure there are no signs of problems, such as the baby not getting enough oxygen. During this test, a belt is placed around your belly. The baby is made to move, and its heart rate is monitored during movement. What is false labor? False labor is a condition in which you feel small, irregular tightenings of the muscles in the womb (contractions) that eventually go away. These are called Braxton Hicks contractions. Contractions may last for hours, days, or even weeks before true labor sets in. If contractions come at regular intervals, become more frequent, increase in intensity, or become painful, you should see your health care provider. What are the signs of labor?  Abdominal cramps.  Regular contractions that start at 10 minutes apart and become stronger and more frequent with time.  Contractions that start on the top of the uterus and spread down to the lower abdomen and back.  Increased pelvic pressure and dull back pain.  A watery or bloody mucus discharge that comes from the vagina.  Leaking of amniotic fluid. This is also known as your "water breaking." It could be a slow trickle or a gush. Let your doctor know if it has a color or strange odor. If you have any of these signs, call your health care provider right away, even if it is before your due date. Follow these instructions at home: Eating and drinking  Continue to eat regular, healthy meals.  Do not eat:  Raw meat or meat spreads.  Unpasteurized milk or cheese.  Unpasteurized juice.  Store-made salad.  Refrigerated smoked seafood.  Hot dogs or deli meat, unless they are piping hot.  More than 6 ounces of albacore tuna a week.  Shark, swordfish, king mackerel, or tile fish.  Store-made salads.  Raw  sprouts, such as mung bean or alfalfa sprouts.  Take prenatal vitamins as told by your health care provider.  Take 1000 mg of calcium daily as told by your health care provider.  If you develop constipation:  Take over-the-counter or prescription medicines.  Drink enough fluid to keep your urine clear or pale yellow.  Eat foods that are high in fiber, such as fresh fruits and vegetables, whole grains, and beans.  Limit foods that are high in fat and processed sugars, such as fried and sweet foods. Activity  Exercise only as directed by your health care provider. Healthy pregnant women should aim for 2 hours and 30 minutes of moderate exercise per week. If you experience any pain or discomfort while exercising, stop.  Avoid heavy lifting.  Do not exercise in extreme heat or humidity, or at high altitudes.  Wear low-heel, comfortable shoes.  Practice good posture.  Do not travel far distances unless it is absolutely necessary and only with the approval   of your health care provider.  Wear your seat belt at all times while in a car, on a bus, or on a plane.  Take frequent breaks and rest with your legs elevated if you have leg cramps or low back pain.  Do not use hot tubs, steam rooms, or saunas.  You may continue to have sex unless your health care provider tells you otherwise. Lifestyle  Do not use any products that contain nicotine or tobacco, such as cigarettes and e-cigarettes. If you need help quitting, ask your health care provider.  Do not drink alcohol.  Do not use any medicinal herbs or unprescribed drugs. These chemicals affect the formation and growth of the baby.  If you develop varicose veins:  Wear support pantyhose or compression stockings as told by your healthcare provider.  Elevate your feet for 15 minutes, 3-4 times a day.  Wear a supportive maternity bra to help with breast tenderness. General instructions  Take over-the-counter and prescription  medicines only as told by your health care provider. There are medicines that are either safe or unsafe to take during pregnancy.  Take warm sitz baths to soothe any pain or discomfort caused by hemorrhoids. Use hemorrhoid cream or witch hazel if your health care provider approves.  Avoid cat litter boxes and soil used by cats. These carry germs that can cause birth defects in the baby. If you have a cat, ask someone to clean the litter box for you.  To prepare for the arrival of your baby:  Take prenatal classes to understand, practice, and ask questions about the labor and delivery.  Make a trial run to the hospital.  Visit the hospital and tour the maternity area.  Arrange for maternity or paternity leave through employers.  Arrange for family and friends to take care of pets while you are in the hospital.  Purchase a rear-facing car seat and make sure you know how to install it in your car.  Pack your hospital bag.  Prepare the baby's nursery. Make sure to remove all pillows and stuffed animals from the baby's crib to prevent suffocation.  Visit your dentist if you have not gone during your pregnancy. Use a soft toothbrush to brush your teeth and be gentle when you floss.  Keep all prenatal follow-up visits as told by your health care provider. This is important. Contact a health care provider if:  You are unsure if you are in labor or if your water has broken.  You become dizzy.  You have mild pelvic cramps, pelvic pressure, or nagging pain in your abdominal area.  You have lower back pain.  You have persistent nausea, vomiting, or diarrhea.  You have an unusual or bad smelling vaginal discharge.  You have pain when you urinate. Get help right away if:  You have a fever.  You are leaking fluid from your vagina.  You have spotting or bleeding from your vagina.  You have severe abdominal pain or cramping.  You have rapid weight loss or weight gain.  You have  shortness of breath with chest pain.  You notice sudden or extreme swelling of your face, hands, ankles, feet, or legs.  Your baby makes fewer than 10 movements in 2 hours.  You have severe headaches that do not go away with medicine.  You have vision changes. Summary  The third trimester is from week 29 through week 40, months 7 through 9. The third trimester is a time when the unborn baby (fetus)   is growing rapidly.  During the third trimester, your discomfort may increase as you and your baby continue to gain weight. You may have abdominal, leg, and back pain, sleeping problems, and an increased need to urinate.  During the third trimester your breasts will keep growing and they will continue to become tender. A yellow fluid (colostrum) may leak from your breasts. This is the first milk you are producing for your baby.  False labor is a condition in which you feel small, irregular tightenings of the muscles in the womb (contractions) that eventually go away. These are called Braxton Hicks contractions. Contractions may last for hours, days, or even weeks before true labor sets in.  Signs of labor can include: abdominal cramps; regular contractions that start at 10 minutes apart and become stronger and more frequent with time; watery or bloody mucus discharge that comes from the vagina; increased pelvic pressure and dull back pain; and leaking of amniotic fluid. This information is not intended to replace advice given to you by your health care provider. Make sure you discuss any questions you have with your health care provider. Document Released: 06/01/2001 Document Revised: 11/13/2015 Document Reviewed: 08/08/2012 Elsevier Interactive Patient Education  2017 Elsevier [employer].  AREA PEDIATRIC/FAMILY PRACTICE PHYSICIANS  [facility] [address] [place], [place]  [address] Phone - [phone]   Fax - [phone]  [facility] [address] [place], [place] [address] Phone - [phone]   Fax - [phone]  [facility]ACK AMOS [address] [place], [place]  [address] Phone - [phone]   Fax - [phone]  [facility] [address] [place], [place]  [address] Phone - [phone]   Fax - [phone]  [facility] [place] [address] [place], [place]  [address] Phone - [phone]   Fax - [phone]  [facility] [address] [place], [place]  [address] Phone - [phone]   Fax - [phone]  [facility] [place] [address] [place], [place]  [address] Phone - [phone]   Fax - [phone]  [facility] AT [facility] [address] [place], [address] [place], [place]  [address] Phone - [phone]   Fax - [phone]  [facility] AT [facility] [address] [place], [place]  [address] Phone - [phone]   Fax - [phone] [facility] AT [place] [address] [place], [place]  [address] Phone - [phone]   Fax - [phone]  [facility] [address] [place], [place]  [address] Phone - [phone]   Fax - [phone]  [facility] [place] [address] [place], [place]  [address] Phone - [phone]   Fax - [phone]  [facility] [place] [address] [place], [place]  [address] Phone - [phone]   Fax - [phone]  [facility] [address], [address] [place], [place]  [address] Phone - [phone]  [facility] [address] [place], [place]  [address] Phone - [phone]   Fax - [phone]  [facility] [address] [place], [place]  [address] Phone - [phone]   Fax - [phone]  [facility] [address] [place], [place]  [address] Phone - [phone]   Fax - [phone]  [facility] [address] [place], [place]  [address] Phone - [phone]  Fax - [phone]   [facility] [address] [place], [place]  [address] Phone - [phone]   Fax - [phone]  [facility] [address] [place], [place]  [address] Phone - [phone]   Fax - [phone]  DAVID RUBIN [address] [place], [place]  [address] Phone - [phone]   Fax - [phone]  [facility] [address] [place], [place]  [address] Phone - [phone]   Fax - [phone]  [facility] [address] [place], [place]  [address] Phone - [phone]   Fax - [phone] Gerarda Fraction [address] [place], [place]  [address] Phone - [phone]   Fax - [phone]  [facility] [address] [place], [place]  [address] Phone - [phone]   Fax - [phone]  [facility] - [place] [address] [place], [place]  [address] Phone - [phone]   Fax - [phone]  Contraception Choices Contraception (birth control) is the use of any methods or devices to prevent pregnancy. Below are some methods to help avoid pregnancy. Hormonal methods  Contraceptive implant. This is a thin, plastic tube containing progesterone hormone. It does not contain estrogen hormone. Your health care provider inserts the tube in the inner part of the upper arm. The tube can remain in place for up to 3 years. After 3 years, the implant must be removed. The implant prevents the ovaries from releasing an egg (ovulation), thickens the cervical mucus to prevent sperm from entering the uterus, and thins the lining of the inside of the uterus.  Progesterone-only injections. These injections are given every 3 months by your health care provider to prevent pregnancy. This synthetic progesterone hormone stops the ovaries from releasing  eggs. It also thickens cervical mucus and changes the uterine lining. This makes it harder for sperm to survive in the uterus.  Birth control pills. These pills contain estrogen and progesterone hormone. They work by preventing the ovaries from releasing eggs (ovulation). They also cause the cervical mucus to thicken, preventing the sperm from entering the uterus. Birth control pills are prescribed by a health care provider.Birth control pills can also be used to treat heavy periods.  Minipill. This type of birth control pill contains only the progesterone hormone. They are taken every day of each month and must be prescribed by your health care provider.  Birth control patch. The patch contains hormones similar to those in birth control pills. It must be changed once a week and is prescribed by a health care provider.  Vaginal ring. The ring contains hormones similar to those in birth control pills. It is left in the vagina for 3 weeks, removed for 1 week, and then a new one is put back in place. The patient must be comfortable inserting and removing the ring from the vagina.A health care provider's prescription is necessary.  Emergency contraception. Emergency contraceptives prevent pregnancy after unprotected sexual intercourse. This pill can be taken right after sex or up to 5 days after unprotected sex. It is most effective the sooner you take the pills after having sexual intercourse. Most emergency contraceptive pills are available without a prescription. Check with your pharmacist. Do not use emergency contraception as your only form of birth control. Barrier methods  Female condom. This is a thin sheath (latex or rubber) that is worn over the penis during sexual intercourse. It can be used  with spermicide to increase effectiveness.  Female condom. This is a soft, loose-fitting sheath that is put into the vagina before sexual intercourse.  Diaphragm. This is a soft, latex, dome-shaped  barrier that must be fitted by a health care provider. It is inserted into the vagina, along with a spermicidal jelly. It is inserted before intercourse. The diaphragm should be left in the vagina for 6 to 8 hours after intercourse.  Cervical cap. This is a round, soft, latex or plastic cup that fits over the cervix and must be fitted by a health care provider. The cap can be left in place for up to 48 hours after intercourse.  Sponge. This is a soft, circular piece of polyurethane foam. The sponge has spermicide in it. It is inserted into the vagina after wetting it and before sexual intercourse.  Spermicides. These are chemicals that kill or block sperm from entering the cervix and uterus. They come in the form of creams, jellies, suppositories, foam, or tablets. They do not require a prescription. They are inserted into the vagina with an applicator before having sexual intercourse. The process must be repeated every time you have sexual intercourse. Intrauterine contraception  Intrauterine device (IUD). This is a T-shaped device that is put in a woman's uterus during a menstrual period to prevent pregnancy. There are 2 types:  Copper IUD. This type of IUD is wrapped in copper wire and is placed inside the uterus. Copper makes the uterus and fallopian tubes produce a fluid that kills sperm. It can stay in place for 10 years.  Hormone IUD. This type of IUD contains the hormone progestin (synthetic progesterone). The hormone thickens the cervical mucus and prevents sperm from entering the uterus, and it also thins the uterine lining to prevent implantation of a fertilized egg. The hormone can weaken or kill the sperm that get into the uterus. It can stay in place for 3-5 years, depending on which type of IUD is used. Permanent methods of contraception  Female tubal ligation. This is when the woman's fallopian tubes are surgically sealed, tied, or blocked to prevent the egg from traveling to the  uterus.  Hysteroscopic sterilization. This involves placing a small coil or insert into each fallopian tube. Your doctor uses a technique called hysteroscopy to do the procedure. The device causes scar tissue to form. This results in permanent blockage of the fallopian tubes, so the sperm cannot fertilize the egg. It takes about 3 months after the procedure for the tubes to become blocked. You must use another form of birth control for these 3 months.  Female sterilization. This is when the female has the tubes that carry sperm tied off (vasectomy).This blocks sperm from entering the vagina during sexual intercourse. After the procedure, the man can still ejaculate fluid (semen). Natural planning methods  Natural family planning. This is not having sexual intercourse or using a barrier method (condom, diaphragm, cervical cap) on days the woman could become pregnant.  Calendar method. This is keeping track of the length of each menstrual cycle and identifying when you are fertile.  Ovulation method. This is avoiding sexual intercourse during ovulation.  Symptothermal method. This is avoiding sexual intercourse during ovulation, using a thermometer and ovulation symptoms.  Post-ovulation method. This is timing sexual intercourse after you have ovulated. Regardless of which type or method of contraception you choose, it is important that you use condoms to protect against the transmission of sexually transmitted infections (STIs). Talk with your health care provider  about which form of contraception is most appropriate for you. This information is not intended to replace advice given to you by your health care provider. Make sure you discuss any questions you have with your health care provider. Document Released: 06/07/2005 Document Revised: 11/13/2015 Document Reviewed: 11/30/2012 Elsevier Interactive Patient Education  2017 [employer]

## 2016-06-24 ENCOUNTER — Telehealth: Payer: Self-pay

## 2016-06-24 NOTE — Telephone Encounter (Signed)
Per Dr. Alysia PennaErvin, pt needs to informed that her STD labs are normal.  Notified pt of results.  Pt had no further questions.

## 2016-06-25 ENCOUNTER — Encounter (HOSPITAL_COMMUNITY): Payer: Self-pay

## 2016-06-25 ENCOUNTER — Encounter (HOSPITAL_COMMUNITY): Payer: Self-pay | Admitting: Anesthesiology

## 2016-06-25 ENCOUNTER — Inpatient Hospital Stay (HOSPITAL_COMMUNITY)
Admission: AD | Admit: 2016-06-25 | Discharge: 2016-06-29 | DRG: 765 | Disposition: A | Discharge status: Home / Self Care | Payer: Medicaid Other | Source: Ambulatory Visit | Attending: Obstetrics & Gynecology | Admitting: Obstetrics & Gynecology

## 2016-06-25 DIAGNOSIS — O9952 Diseases of the respiratory system complicating childbirth: Secondary | ICD-10-CM | POA: Diagnosis present

## 2016-06-25 DIAGNOSIS — O4693 Antepartum hemorrhage, unspecified, third trimester: Secondary | ICD-10-CM | POA: Diagnosis present

## 2016-06-25 DIAGNOSIS — Z98891 History of uterine scar from previous surgery: Secondary | ICD-10-CM

## 2016-06-25 DIAGNOSIS — Z87891 Personal history of nicotine dependence: Secondary | ICD-10-CM

## 2016-06-25 DIAGNOSIS — J45909 Unspecified asthma, uncomplicated: Secondary | ICD-10-CM | POA: Diagnosis present

## 2016-06-25 DIAGNOSIS — O4440 Low lying placenta NOS or without hemorrhage, unspecified trimester: Secondary | ICD-10-CM

## 2016-06-25 DIAGNOSIS — N939 Abnormal uterine and vaginal bleeding, unspecified: Secondary | ICD-10-CM | POA: Diagnosis present

## 2016-06-25 DIAGNOSIS — Z8249 Family history of ischemic heart disease and other diseases of the circulatory system: Secondary | ICD-10-CM | POA: Diagnosis not present

## 2016-06-25 DIAGNOSIS — Z833 Family history of diabetes mellitus: Secondary | ICD-10-CM | POA: Diagnosis not present

## 2016-06-25 DIAGNOSIS — Z3A33 33 weeks gestation of pregnancy: Secondary | ICD-10-CM

## 2016-06-25 DIAGNOSIS — O4453 Low lying placenta with hemorrhage, third trimester: Secondary | ICD-10-CM | POA: Diagnosis not present

## 2016-06-25 LAB — CBC
HCT: 29.7 % — ABNORMAL LOW (ref 36.0–46.0)
HEMOGLOBIN: 9.8 g/dL — AB (ref 12.0–15.0)
MCH: 29 pg (ref 26.0–34.0)
MCHC: 33 g/dL (ref 30.0–36.0)
MCV: 87.9 fL (ref 78.0–100.0)
Platelets: 184 10*3/uL (ref 150–400)
RBC: 3.38 MIL/uL — AB (ref 3.87–5.11)
RDW: 13.7 % (ref 11.5–15.5)
WBC: 8 10*3/uL (ref 4.0–10.5)

## 2016-06-25 MED ORDER — PRENATAL MULTIVITAMIN CH: 1.0000 | ORAL_TABLET | Freq: Every day | ORAL | Status: DC

## 2016-06-25 MED ORDER — LACTATED RINGERS IV SOLN
INTRAVENOUS | Status: AC
  Administered 2016-06-25: 12:00:00 via INTRAVENOUS

## 2016-06-25 MED ORDER — ZOLPIDEM TARTRATE 5 MG PO TABS
5.0000 mg | ORAL_TABLET | Freq: Every evening | ORAL | Status: DC | PRN
  Administered 2016-06-25: 5 mg via ORAL
  Filled 2016-06-25: qty 1

## 2016-06-25 MED ORDER — CALCIUM CARBONATE ANTACID 500 MG PO CHEW: 2.0000 | CHEWABLE_TABLET | ORAL | Status: DC | PRN

## 2016-06-25 MED ORDER — SODIUM CHLORIDE 0.9% FLUSH
3.0000 mL | Freq: Two times a day (BID) | INTRAVENOUS | Status: DC
  Administered 2016-06-25 – 2016-06-26 (×2): 3 mL via INTRAVENOUS

## 2016-06-25 MED ORDER — DOCUSATE SODIUM 100 MG PO CAPS
100.0000 mg | ORAL_CAPSULE | Freq: Every day | ORAL | Status: DC
  Administered 2016-06-26: 100 mg via ORAL
  Filled 2016-06-25: qty 1

## 2016-06-25 MED ORDER — ACETAMINOPHEN 325 MG PO TABS: 650.0000 mg | ORAL_TABLET | ORAL | Status: DC | PRN

## 2016-06-25 MED ORDER — METRONIDAZOLE 500 MG PO TABS
500.0000 mg | ORAL_TABLET | Freq: Two times a day (BID) | ORAL | Status: DC
  Administered 2016-06-25 – 2016-06-26 (×2): 500 mg via ORAL
  Filled 2016-06-25 (×2): qty 1

## 2016-06-25 MED ORDER — BETAMETHASONE SOD PHOS & ACET 6 (3-3) MG/ML IJ SUSP
12.0000 mg | INTRAMUSCULAR | Status: AC
  Administered 2016-06-25 – 2016-06-26 (×2): 12 mg via INTRAMUSCULAR
  Filled 2016-06-25 (×2): qty 2

## 2016-06-25 NOTE — H&P (Signed)
ANTEPARTUM ADMISSION HISTORY AND PHYSICAL NOTE   History of Present Illness: Lori Lloyd is a 21 y.o. [id number] at [redacted]w[redacted]d admitted for a total bleeding.  Patient reports she had a bleed in December which was very minimal she was seen in [place] [facility] diagnosed with a low-lying placenta that is 1.5 cm from the internal os and was discharged home. She reports since then she has had no sex and nothing in the vagina but last night started with some fairly heavy vaginal bleeding. She went to bed hoping it would stop and this morning continued to have moderate bleeding. Patient reports the fetal movement as active. Patient reports uterine contraction  activity as none. Patient reports  vaginal bleeding as less flow than a normal period. Patient describes fluid per vagina as None. Fetal presentation is cephalic.  Patient Active Problem List   Diagnosis Date Noted  . Vaginal bleeding 06/25/2016  . Low-lying placenta 06/15/2016  . Placenta previa 06/09/2016  . Depressive disorder 02/18/2013  . Supervision of high-risk pregnancy 02/18/2013    Past Medical History:  Diagnosis Date  . Asthma   . Depression   . Eczema   . Fracture    ring finger right hand    Past Surgical History:  Procedure Laterality Date  . INDUCED ABORTION    . WISDOM TOOTH EXTRACTION      OB History  Gravida Para Term Preterm AB Living  4 1 1  0 2 1  SAB TAB Ectopic Multiple Live Births  0 2 0 0 1    # Outcome Date GA Lbr Len/2nd Weight Sex Delivery Anes PTL Lv  4 Current           3 TAB 2015          2 TAB 05/2013          1 Term 10/03/12 [redacted]w[redacted]d 06:03 / 00:22 7 lb 12.9 oz (3.541 kg) F Vag-Spont EPI  LIV      Social History   Social History  . Marital status: Single    Spouse name: N/A  . Number of children: N/A  . Years of education: N/A   Social History Main Topics  . Smoking status: Former Smoker    Packs/day: 0.25    Years: 0.50    Types: Cigarettes  . Smokeless tobacco: Never Used     Comment: with  preg  . Alcohol use No  . Drug use: No     Comment: few wks, since she been sick  . Sexual activity: Yes    Birth control/ protection: None   Other Topics Concern  . None   Social History Narrative  . None    Family History  Problem Relation Age of Onset  . Diabetes Maternal Grandmother   . Hypertension Maternal Grandmother   . Hearing loss Neg Hx     Allergies  Allergen Reactions  . Shellfish Allergy Anaphylaxis    Prescriptions Prior to Admission  Medication Sig Dispense Refill Last Dose  . metroNIDAZOLE (FLAGYL) 500 MG tablet Take 500 mg by mouth 2 (two) times daily.   06/25/2016 at Unknown time  . albuterol (PROVENTIL HFA;VENTOLIN HFA) 108 (90 Base) MCG/ACT inhaler Inhale 1-2 puffs into the lungs every 6 (six) hours as needed for wheezing or shortness of breath. 1 Inhaler 1 rescue  . docusate sodium (COLACE) 100 MG capsule Take 1 capsule (100 mg total) by mouth 2 (two) times daily as needed. (Patient not taking: Reported on 06/25/2016) 30 capsule 2  Not Taking at Unknown time  . polyethylene glycol powder (GLYCOLAX/MIRALAX) powder Take 17 g by mouth daily. (Patient not taking: Reported on 06/25/2016) 255 g 0 Not Taking at Unknown time  . Prenatal Vit-Fe Fumarate-FA (PRENATAL MULTIVITAMIN) TABS tablet Take 1 tablet by mouth daily at 12 noon. (Patient not taking: Reported on 06/25/2016) 30 tablet 12 Not Taking at Unknown time    Review of Systems  Constitutional: Positive for malaise/fatigue. Negative for chills and fever.  HENT: Negative for congestion and hearing loss.   Eyes: Negative for blurred vision and double vision.  Respiratory: Negative for cough and shortness of breath.   Cardiovascular: Negative for chest pain and palpitations.  Gastrointestinal: Negative for abdominal pain, constipation, diarrhea, heartburn, nausea and vomiting.  Genitourinary: Negative for dysuria and urgency.  Skin: Negative for itching and rash.  Neurological: Positive for weakness.      Vitals:  BP (!) 103/54 (BP Location: Right Arm)   Pulse 83   Temp 98.2 F (36.8 C)   Resp 16   LMP 11/03/2015 (Approximate)  Physical Examination: CONSTITUTIONAL: Well-developed, well-nourished female in no acute distress.  HENT:  Normocephalic, atraumatic, External right and left ear normal. Oropharynx is clear and moist EYES: Conjunctivae and EOM are normal. Pupils are equal, round, and reactive to light. No scleral icterus.  NECK: Normal range of motion, supple, no masses SKIN: Skin is warm and dry. No rash noted. Not diaphoretic. No erythema. No pallor. NEUROLGIC: Alert and oriented to person, place, and time. Normal reflexes, muscle tone coordination. No cranial nerve deficit noted. PSYCHIATRIC: Normal mood and affect. Normal behavior. Normal judgment and thought content. CARDIOVASCULAR: Normal heart rate noted, regular rhythm RESPIRATORY: Effort and breath sounds normal, no problems with respiration noted ABDOMEN: Soft, nontender, nondistended, gravid. MUSCULOSKELETAL: Normal range of motion. No edema and no tenderness. 2+ distal pulses.  Cervix: Evaluated by sterile speculum exam. and found to be closed/ thick and fetal presentation is cephalic. By last ultrasound Vaginal vault: No significant blood noted in the vaginal vault. After collecting the gonorrhea and chlamydia swabs some mild bleeding from the cervix. Membranes:intact Fetal Monitoring:Baseline: 145 bpm, Variability: Good {> 6 bpm) and Accelerations: Reactive Tocometer: Flat  Labs:  No results found for this or any previous visit (from the past 24 hour(s)).  Imaging Studies: [facility] Ob Transvaginal  Result Date: 06/15/2016 OBSTETRICAL ULTRASOUND: This exam was performed within a [facility]. The OB US report was generated in the AS system, and faxed to the ordering physician.  This report is available in the [facility] [employer] PACS. See the AS Obstetric US report via the Image Link.  [facility] Ob  Limited  Result Date: 06/15/2016 OBSTETRICAL ULTRASOUND: This exam was performed within a [facility]. The OB US report was generated in the AS system, and faxed to the ordering physician.  This report is available in the [facility] [employer] PACS. See the AS Obstetric US report via the Image Link.    Assessment and Plan: Patient Active Problem List   Diagnosis Date Noted  . Vaginal bleeding 06/25/2016  . Low-lying placenta 06/15/2016  . Placenta previa 06/09/2016  . Depressive disorder 02/18/2013  . Supervision of high-risk pregnancy 02/18/2013   Admit to Antenatal Routine antenatal care  #1: low-lying placenta/vaginal bleeding: Last ultrasound on 12/24 showed placenta 1.5 cm from the cervical os. Patient is scheduled for repeat on January 10 can repeat as an inpatient at that time. -Given betamethasone -Continuous fetal monitoring at this time -Rh+, CBC  and type and cross ordered  Ernestina Penna, MD OB fellow Faculty Practice, [facility]

## 2016-06-25 NOTE — Progress Notes (Signed)
Pt comfortable, "bored" , accepting of need for longer eval this admission. Previously d/c'd after MAU eval for lite spotting, but this bleeding episode noted on pt phone Pic as 6" bloody area on pad. No clots.  Currently pt not contracting and FHR CAT I Will d/c continuous toco and EFM , begin NST bid.

## 2016-06-25 NOTE — Progress Notes (Signed)
Patient aware she has orders for continuous fetal monitoring. Patient continues to stay off of the monitor in chair or restroom as she wishes. Reminded patient of orders and rationale.

## 2016-06-25 NOTE — MAU Note (Signed)
Patient presents with vaginal bleeding since last night did not want to come to the hospital, has a placenta previa. Positive FM

## 2016-06-25 NOTE — Progress Notes (Signed)
No active bleeding noted on pad at present.

## 2016-06-25 NOTE — Progress Notes (Signed)
Attempted IV x 2 unsuccessful, and 1 attempt by Sunday CornLindsey Seymour RN unsuccessful, contacted CRNA to come to MAU to attempt IV start.

## 2016-06-26 ENCOUNTER — Inpatient Hospital Stay (HOSPITAL_COMMUNITY): Payer: Medicaid Other

## 2016-06-26 ENCOUNTER — Inpatient Hospital Stay (HOSPITAL_COMMUNITY): Payer: Medicaid Other | Admitting: Certified Registered Nurse Anesthetist

## 2016-06-26 ENCOUNTER — Encounter (HOSPITAL_COMMUNITY)
Admission: AD | Disposition: A | Discharge status: Home / Self Care | Payer: Self-pay | Source: Ambulatory Visit | Attending: Obstetrics & Gynecology

## 2016-06-26 ENCOUNTER — Encounter (HOSPITAL_COMMUNITY): Payer: Self-pay | Admitting: Certified Registered Nurse Anesthetist

## 2016-06-26 DIAGNOSIS — Z3A33 33 weeks gestation of pregnancy: Secondary | ICD-10-CM

## 2016-06-26 DIAGNOSIS — O4453 Low lying placenta with hemorrhage, third trimester: Secondary | ICD-10-CM

## 2016-06-26 LAB — CBC
HEMATOCRIT: 27.3 % — AB (ref 36.0–46.0)
HEMOGLOBIN: 9.2 g/dL — AB (ref 12.0–15.0)
MCH: 29.3 pg (ref 26.0–34.0)
MCHC: 33.7 g/dL (ref 30.0–36.0)
MCV: 86.9 fL (ref 78.0–100.0)
Platelets: 167 10*3/uL (ref 150–400)
RBC: 3.14 MIL/uL — AB (ref 3.87–5.11)
RDW: 13.5 % (ref 11.5–15.5)
WBC: 15.9 10*3/uL — ABNORMAL HIGH (ref 4.0–10.5)

## 2016-06-26 LAB — PREPARE RBC (CROSSMATCH)

## 2016-06-26 SURGERY — Surgical Case
Anesthesia: Spinal

## 2016-06-26 MED ORDER — DIBUCAINE 1 % RE OINT: 1.0000 "application " | TOPICAL_OINTMENT | RECTAL | Status: DC | PRN

## 2016-06-26 MED ORDER — DIPHENHYDRAMINE HCL 25 MG PO CAPS
25.0000 mg | ORAL_CAPSULE | ORAL | Status: DC | PRN
  Filled 2016-06-26: qty 1

## 2016-06-26 MED ORDER — PHENYLEPHRINE 8 MG IN D5W 100 ML (0.08MG/ML) PREMIX OPTIME
INJECTION | INTRAVENOUS | Status: AC
  Filled 2016-06-26: qty 100

## 2016-06-26 MED ORDER — METHYLERGONOVINE MALEATE 0.2 MG/ML IJ SOLN
INTRAMUSCULAR | Status: AC
  Filled 2016-06-26: qty 1

## 2016-06-26 MED ORDER — MORPHINE SULFATE (PF) 0.5 MG/ML IJ SOLN
INTRAMUSCULAR | Status: DC | PRN
  Administered 2016-06-26: .2 mg via INTRATHECAL
  Administered 2016-06-26: .3 mg via INTRAVENOUS

## 2016-06-26 MED ORDER — KETOROLAC TROMETHAMINE 30 MG/ML IJ SOLN: 30.0000 mg | Freq: Four times a day (QID) | INTRAMUSCULAR | Status: DC | PRN

## 2016-06-26 MED ORDER — METHYLERGONOVINE MALEATE 0.2 MG/ML IJ SOLN
INTRAMUSCULAR | Status: DC | PRN
  Administered 2016-06-26: 0.2 mg via INTRAMUSCULAR

## 2016-06-26 MED ORDER — SCOPOLAMINE 1 MG/3DAYS TD PT72
MEDICATED_PATCH | TRANSDERMAL | Status: AC
  Filled 2016-06-26: qty 1

## 2016-06-26 MED ORDER — OXYTOCIN 10 UNIT/ML IJ SOLN
INTRAVENOUS | Status: DC | PRN
  Administered 2016-06-26: 40 [IU] via INTRAVENOUS

## 2016-06-26 MED ORDER — PRENATAL MULTIVITAMIN CH
1.0000 | ORAL_TABLET | Freq: Every day | ORAL | Status: DC
  Administered 2016-06-27 – 2016-06-28 (×2): 1 via ORAL
  Filled 2016-06-26 (×2): qty 1

## 2016-06-26 MED ORDER — NALOXONE HCL 2 MG/2ML IJ SOSY
1.0000 ug/kg/h | PREFILLED_SYRINGE | INTRAVENOUS | Status: DC | PRN
  Filled 2016-06-26: qty 2

## 2016-06-26 MED ORDER — NALBUPHINE HCL 10 MG/ML IJ SOLN: 5.0000 mg | Freq: Once | INTRAMUSCULAR | Status: DC | PRN

## 2016-06-26 MED ORDER — TETANUS-DIPHTH-ACELL PERTUSSIS 5-2.5-18.5 LF-MCG/0.5 IM SUSP: 0.5000 mL | Freq: Once | INTRAMUSCULAR | Status: DC

## 2016-06-26 MED ORDER — ONDANSETRON HCL 4 MG/2ML IJ SOLN
INTRAMUSCULAR | Status: AC
  Filled 2016-06-26: qty 2

## 2016-06-26 MED ORDER — DEXAMETHASONE SODIUM PHOSPHATE 10 MG/ML IJ SOLN
INTRAMUSCULAR | Status: DC | PRN
  Administered 2016-06-26: 10 mg via INTRAVENOUS

## 2016-06-26 MED ORDER — IBUPROFEN 600 MG PO TABS
600.0000 mg | ORAL_TABLET | Freq: Four times a day (QID) | ORAL | Status: DC | PRN
  Administered 2016-06-27: 600 mg via ORAL
  Filled 2016-06-26: qty 1

## 2016-06-26 MED ORDER — MEPERIDINE HCL 25 MG/ML IJ SOLN
INTRAMUSCULAR | Status: AC
  Filled 2016-06-26: qty 1

## 2016-06-26 MED ORDER — ONDANSETRON HCL 4 MG/2ML IJ SOLN: 4.0000 mg | Freq: Three times a day (TID) | INTRAMUSCULAR | Status: DC | PRN

## 2016-06-26 MED ORDER — MISOPROSTOL 200 MCG PO TABS
ORAL_TABLET | ORAL | Status: AC
  Filled 2016-06-26: qty 4

## 2016-06-26 MED ORDER — OXYTOCIN 40 UNITS IN LACTATED RINGERS INFUSION - SIMPLE MED
2.5000 [IU]/h | INTRAVENOUS | Status: AC
  Administered 2016-06-26: 2.5 [IU]/h via INTRAVENOUS

## 2016-06-26 MED ORDER — ONDANSETRON HCL 4 MG/2ML IJ SOLN
INTRAMUSCULAR | Status: DC | PRN
  Administered 2016-06-26: 4 mg via INTRAVENOUS

## 2016-06-26 MED ORDER — NALBUPHINE HCL 10 MG/ML IJ SOLN: 5.0000 mg | INTRAMUSCULAR | Status: DC | PRN

## 2016-06-26 MED ORDER — SIMETHICONE 80 MG PO CHEW
80.0000 mg | CHEWABLE_TABLET | Freq: Three times a day (TID) | ORAL | Status: DC
  Administered 2016-06-27 – 2016-06-29 (×6): 80 mg via ORAL
  Filled 2016-06-26 (×6): qty 1

## 2016-06-26 MED ORDER — SCOPOLAMINE 1 MG/3DAYS TD PT72
1.0000 | MEDICATED_PATCH | Freq: Once | TRANSDERMAL | Status: DC
Start: 2016-06-26 — End: 2016-06-29

## 2016-06-26 MED ORDER — ACETAMINOPHEN 325 MG PO TABS
650.0000 mg | ORAL_TABLET | ORAL | Status: DC | PRN
  Administered 2016-06-26: 650 mg via ORAL
  Filled 2016-06-26: qty 2

## 2016-06-26 MED ORDER — SODIUM CHLORIDE 0.9% FLUSH: 3.0000 mL | INTRAVENOUS | Status: DC | PRN

## 2016-06-26 MED ORDER — OXYCODONE HCL 5 MG PO TABS
5.0000 mg | ORAL_TABLET | ORAL | Status: DC | PRN
  Administered 2016-06-26 – 2016-06-28 (×5): 5 mg via ORAL
  Filled 2016-06-26 (×5): qty 1

## 2016-06-26 MED ORDER — LACTATED RINGERS IV SOLN
INTRAVENOUS | Status: DC | PRN
Start: 2016-06-26 — End: 2016-06-26
  Administered 2016-06-26 (×2): via INTRAVENOUS

## 2016-06-26 MED ORDER — PROMETHAZINE HCL 25 MG/ML IJ SOLN: 6.2500 mg | INTRAMUSCULAR | Status: DC | PRN

## 2016-06-26 MED ORDER — ACETAMINOPHEN 500 MG PO TABS
1000.0000 mg | ORAL_TABLET | Freq: Four times a day (QID) | ORAL | Status: AC
  Administered 2016-06-26 – 2016-06-27 (×2): 1000 mg via ORAL
  Filled 2016-06-26 (×2): qty 2

## 2016-06-26 MED ORDER — FENTANYL CITRATE (PF) 100 MCG/2ML IJ SOLN
INTRAMUSCULAR | Status: DC | PRN
  Administered 2016-06-26: 90 ug via INTRAVENOUS
  Administered 2016-06-26: 10 ug via INTRATHECAL

## 2016-06-26 MED ORDER — LACTATED RINGERS IV SOLN
INTRAVENOUS | Status: DC | PRN
  Administered 2016-06-26: 12:00:00 via INTRAVENOUS

## 2016-06-26 MED ORDER — IBUPROFEN 600 MG PO TABS
600.0000 mg | ORAL_TABLET | Freq: Four times a day (QID) | ORAL | Status: DC
  Administered 2016-06-26 – 2016-06-29 (×10): 600 mg via ORAL
  Filled 2016-06-26 (×10): qty 1

## 2016-06-26 MED ORDER — ZOLPIDEM TARTRATE 5 MG PO TABS: 5.0000 mg | ORAL_TABLET | Freq: Every evening | ORAL | Status: DC | PRN

## 2016-06-26 MED ORDER — MENTHOL 3 MG MT LOZG: 1.0000 | LOZENGE | OROMUCOSAL | Status: DC | PRN

## 2016-06-26 MED ORDER — SODIUM CHLORIDE 0.9 % IV SOLN: Freq: Once | INTRAVENOUS | Status: DC

## 2016-06-26 MED ORDER — MORPHINE SULFATE (PF) 0.5 MG/ML IJ SOLN
INTRAMUSCULAR | Status: AC
  Filled 2016-06-26: qty 10

## 2016-06-26 MED ORDER — OXYCODONE HCL 5 MG PO TABS: 10.0000 mg | ORAL_TABLET | ORAL | Status: DC | PRN

## 2016-06-26 MED ORDER — MEPERIDINE HCL 25 MG/ML IJ SOLN: 6.2500 mg | INTRAMUSCULAR | Status: DC | PRN

## 2016-06-26 MED ORDER — FENTANYL CITRATE (PF) 100 MCG/2ML IJ SOLN
INTRAMUSCULAR | Status: AC
  Filled 2016-06-26: qty 2

## 2016-06-26 MED ORDER — SENNOSIDES-DOCUSATE SODIUM 8.6-50 MG PO TABS
2.0000 | ORAL_TABLET | ORAL | Status: DC
  Administered 2016-06-28 – 2016-06-29 (×2): 2 via ORAL
  Filled 2016-06-26 (×3): qty 2

## 2016-06-26 MED ORDER — CEFAZOLIN SODIUM-DEXTROSE 2-3 GM-% IV SOLR
INTRAVENOUS | Status: DC | PRN
  Administered 2016-06-26: 2 g via INTRAVENOUS

## 2016-06-26 MED ORDER — DIPHENHYDRAMINE HCL 25 MG PO CAPS: 25.0000 mg | ORAL_CAPSULE | Freq: Four times a day (QID) | ORAL | Status: DC | PRN

## 2016-06-26 MED ORDER — SOD CITRATE-CITRIC ACID 500-334 MG/5ML PO SOLN
30.0000 mL | Freq: Once | ORAL | Status: AC
  Administered 2016-06-26: 30 mL via ORAL

## 2016-06-26 MED ORDER — KETOROLAC TROMETHAMINE 30 MG/ML IJ SOLN
30.0000 mg | Freq: Four times a day (QID) | INTRAMUSCULAR | Status: DC | PRN
  Administered 2016-06-26: 30 mg via INTRAMUSCULAR

## 2016-06-26 MED ORDER — COCONUT OIL OIL
1.0000 "application " | TOPICAL_OIL | Status: DC | PRN
  Filled 2016-06-26: qty 120

## 2016-06-26 MED ORDER — BUPIVACAINE IN DEXTROSE 0.75-8.25 % IT SOLN
INTRATHECAL | Status: DC | PRN
  Administered 2016-06-26: 1.4 mL via INTRATHECAL

## 2016-06-26 MED ORDER — SOD CITRATE-CITRIC ACID 500-334 MG/5ML PO SOLN
ORAL | Status: AC
  Filled 2016-06-26: qty 15

## 2016-06-26 MED ORDER — LACTATED RINGERS IV SOLN
INTRAVENOUS | Status: DC
  Administered 2016-06-26: 19:00:00 via INTRAVENOUS

## 2016-06-26 MED ORDER — HYDROMORPHONE HCL 1 MG/ML IJ SOLN: 0.2500 mg | INTRAMUSCULAR | Status: DC | PRN

## 2016-06-26 MED ORDER — PHENYLEPHRINE 8 MG IN D5W 100 ML (0.08MG/ML) PREMIX OPTIME
INJECTION | INTRAVENOUS | Status: DC | PRN
  Administered 2016-06-26: 60 ug/min via INTRAVENOUS

## 2016-06-26 MED ORDER — CEFAZOLIN SODIUM-DEXTROSE 2-4 GM/100ML-% IV SOLN
INTRAVENOUS | Status: AC
  Filled 2016-06-26: qty 100

## 2016-06-26 MED ORDER — KETOROLAC TROMETHAMINE 30 MG/ML IJ SOLN: 30.0000 mg | Freq: Once | INTRAMUSCULAR | Status: DC

## 2016-06-26 MED ORDER — OXYTOCIN 10 UNIT/ML IJ SOLN
INTRAMUSCULAR | Status: AC
  Filled 2016-06-26: qty 4

## 2016-06-26 MED ORDER — SIMETHICONE 80 MG PO CHEW
80.0000 mg | CHEWABLE_TABLET | ORAL | Status: DC
  Administered 2016-06-28 – 2016-06-29 (×2): 80 mg via ORAL
  Filled 2016-06-26 (×3): qty 1

## 2016-06-26 MED ORDER — NALOXONE HCL 0.4 MG/ML IJ SOLN: 0.4000 mg | INTRAMUSCULAR | Status: DC | PRN

## 2016-06-26 MED ORDER — SCOPOLAMINE 1 MG/3DAYS TD PT72
MEDICATED_PATCH | TRANSDERMAL | Status: DC | PRN
  Administered 2016-06-26: 1 via TRANSDERMAL

## 2016-06-26 MED ORDER — SIMETHICONE 80 MG PO CHEW: 80.0000 mg | CHEWABLE_TABLET | ORAL | Status: DC | PRN

## 2016-06-26 MED ORDER — MEPERIDINE HCL 25 MG/ML IJ SOLN
INTRAMUSCULAR | Status: DC | PRN
  Administered 2016-06-26 (×2): 12.5 mg via INTRAVENOUS

## 2016-06-26 MED ORDER — DIPHENHYDRAMINE HCL 50 MG/ML IJ SOLN: 12.5000 mg | INTRAMUSCULAR | Status: DC | PRN

## 2016-06-26 MED ORDER — KETOROLAC TROMETHAMINE 30 MG/ML IJ SOLN
INTRAMUSCULAR | Status: AC
  Filled 2016-06-26: qty 1

## 2016-06-26 MED ORDER — DEXAMETHASONE SODIUM PHOSPHATE 10 MG/ML IJ SOLN
INTRAMUSCULAR | Status: AC
  Filled 2016-06-26: qty 1

## 2016-06-26 MED ORDER — WITCH HAZEL-GLYCERIN EX PADS: 1.0000 "application " | MEDICATED_PAD | CUTANEOUS | Status: DC | PRN

## 2016-06-26 SURGICAL SUPPLY — 33 items
APL SKNCLS STERI-STRIP NONHPOA (GAUZE/BANDAGES/DRESSINGS) ×1
BENZOIN TINCTURE PRP APPL 2/3 (GAUZE/BANDAGES/DRESSINGS) ×2 IMPLANT
CHLORAPREP W/TINT 26ML (MISCELLANEOUS) ×3 IMPLANT
CLAMP CORD UMBIL (MISCELLANEOUS) IMPLANT
CLOSURE STERI STRIP 1/2 X4 (GAUZE/BANDAGES/DRESSINGS) ×2 IMPLANT
CLOTH BEACON ORANGE TIMEOUT ST (SAFETY) ×3 IMPLANT
DRAIN JACKSON PRT FLT 7MM (DRAIN) IMPLANT
DRSG OPSITE POSTOP 4X10 (GAUZE/BANDAGES/DRESSINGS) ×3 IMPLANT
ELECT REM PT RETURN 9FT ADLT (ELECTROSURGICAL) ×3
ELECTRODE REM PT RTRN 9FT ADLT (ELECTROSURGICAL) ×1 IMPLANT
EVACUATOR SILICONE 100CC (DRAIN) IMPLANT
EXTRACTOR VACUUM M CUP 4 TUBE (SUCTIONS) IMPLANT
EXTRACTOR VACUUM M CUP 4' TUBE (SUCTIONS)
GLOVE BIO SURGEON STRL SZ7 (GLOVE) ×3 IMPLANT
GLOVE BIOGEL PI IND STRL 7.0 (GLOVE) ×2 IMPLANT
GLOVE BIOGEL PI INDICATOR 7.0 (GLOVE) ×4
GOWN STRL REUS W/TWL LRG LVL3 (GOWN DISPOSABLE) ×6 IMPLANT
KIT ABG SYR 3ML LUER SLIP (SYRINGE) IMPLANT
NDL HYPO 25X5/8 SAFETYGLIDE (NEEDLE) ×1 IMPLANT
NEEDLE HYPO 25X5/8 SAFETYGLIDE (NEEDLE) ×3 IMPLANT
NS IRRIG 1000ML POUR BTL (IV SOLUTION) ×3 IMPLANT
PACK C SECTION WH (CUSTOM PROCEDURE TRAY) ×3 IMPLANT
PAD ABD 7.5X8 STRL (GAUZE/BANDAGES/DRESSINGS) ×2 IMPLANT
PAD OB MATERNITY 4.3X12.25 (PERSONAL CARE ITEMS) ×3 IMPLANT
PENCIL SMOKE EVAC W/HOLSTER (ELECTROSURGICAL) ×3 IMPLANT
RETAINER VISCERAL (MISCELLANEOUS) ×2 IMPLANT
RTRCTR C-SECT PINK 25CM LRG (MISCELLANEOUS) ×3 IMPLANT
SPONGE GAUZE 4X4 12PLY STER LF (GAUZE/BANDAGES/DRESSINGS) ×4 IMPLANT
SUT VIC AB 0 CTX 36 (SUTURE) ×15
SUT VIC AB 0 CTX36XBRD ANBCTRL (SUTURE) ×5 IMPLANT
SUT VIC AB 4-0 KS 27 (SUTURE) ×3 IMPLANT
TOWEL OR 17X24 6PK STRL BLUE (TOWEL DISPOSABLE) ×3 IMPLANT
TRAY FOLEY CATH SILVER 14FR (SET/KITS/TRAYS/PACK) ×3 IMPLANT

## 2016-06-26 NOTE — Addendum Note (Signed)
Addendum  created 06/26/16 1710 by Rica RecordsAngela Matheu Ploeger, CRNA   Sign clinical note

## 2016-06-26 NOTE — Progress Notes (Signed)
Patient ID: Lori PicketMekierra A Allnutt, female   DOB: 1996/03/16, 21 y.o.   MRN: [id number]  . FACULTY PRACTICE ANTEPARTUM(COMPREHENSIVE) NOTE  Lori Lloyd is a 21 y.o. [id number] at 6867w5d  who is admitted for vagianl bleeding--newly diagnosed vasa previa.   Length of Stay:  1  Days  Subjective: Patient reports the fetal movement as active. Patient reports uterine contraction  activity as none. Patient reports  vaginal bleeding as less flow than a normal period. Patient describes fluid per vagina as None.  Vitals:  Blood pressure (!) 107/57, pulse 91, temperature 98.5 F (36.9 C), temperature source Oral, resp. rate 18, height 5\' 1"  (1.549 m), weight 187 lb (84.8 kg), last menstrual period 11/03/2015, SpO2 100 %. Physical Examination:  General appearance - alert, well appearing, and in no distress Abdomen - soft, gravid, non tender Extremities - no pedal edema noted, Homan's sign negative bilaterally  Fetal Monitoring:  Baseline: 145 bpm, Variability: Good {> 6 bpm), Accelerations: Non-reactive but appropriate for gestational age and Decelerations: Absent  Labs:  Results for orders placed or performed during the hospital encounter of 06/25/16 (from the past 24 hour(s))  CBC on admission   Collection Time: 06/25/16 12:01 PM  Result Value Ref Range   WBC 8.0 4.0 - 10.5 K/uL   RBC 3.38 (L) 3.87 - 5.11 MIL/uL   Hemoglobin 9.8 (L) 12.0 - 15.0 g/dL   HCT 14.729.7 (L) 82.936.0 - 56.246.0 %   MCV 87.9 78.0 - 100.0 fL   MCH 29.0 26.0 - 34.0 pg   MCHC 33.0 30.0 - 36.0 g/dL   RDW 13.013.7 86.511.5 - 78.415.5 %   Platelets 184 150 - 400 K/uL  Type and screen [facility]   Collection Time: 06/25/16 12:01 PM  Result Value Ref Range   ABO/RH(D) O POS    Antibody Screen NEG    Sample Expiration 06/28/2016     Imaging Studies:    Vasa previa--called by Dr. Otho PerlNitsche urgent report  Medications:  Scheduled . sodium chloride   Intravenous Once  . betamethasone acetate-betamethasone sodium phosphate  12  mg Intramuscular Q24H  . docusate sodium  100 mg Oral Daily  . metroNIDAZOLE  500 mg Oral BID  . prenatal multivitamin  1 tablet Oral Q1200  . sodium chloride flush  3 mL Intravenous Q12H  . sodium citrate-citric acid       I have reviewed the patient's current medications.  ASSESSMENT: Patient Active Problem List   Diagnosis Date Noted  . Vaginal bleeding 06/25/2016  . Low-lying placenta 06/15/2016  . Placenta previa 06/09/2016  . Depressive disorder 02/18/2013  . Supervision of high-risk pregnancy 02/18/2013   NEWLY DIAGNOSED VASA PREVIA  PLAN: 21 yo G4P1021 at 33 weeks 5 days with vasa previa and bleeding more than spotting (light period).  Given catastrophic nature of ROM and vasa previa will proceed with urgent delivery.  MFM Otho Perlitsche agrees with plan.  The risks of cesarean section discussed with the patient included but were not limited to: bleeding which may require transfusion or reoperation; infection which may require antibiotics; injury to bowel, bladder, ureters or other surrounding organs; injury to the fetus; need for additional procedures including hysterectomy in the event of a life-threatening hemorrhage; placental abnormalities wth subsequent pregnancies, incisional problems, thromboembolic phenomenon and other postoperative/anesthesia complications. The patient concurred with the proposed plan, giving informed written consent for the procedure.      Elsie LincolnKelly Delfina Schreurs 06/26/2016,11:19 AM

## 2016-06-26 NOTE — Transfer of Care (Signed)
Immediate Anesthesia Transfer of Care Note  Patient: Lori Lloyd, Lori Lloyd  Procedure(s) Performed: Procedure(s): CESAREAN SECTION (N/A)  Patient Location: PACU  Anesthesia Type:Spinal  Level of Consciousness: awake, alert , oriented and patient cooperative  Airway & Oxygen Therapy: Patient Spontanous Breathing  Post-op Assessment: Report given to RN and Post -op Vital signs reviewed and stable  Post vital signs: Reviewed and stable  Last Vitals:  Vitals:   06/26/16 0755 06/26/16 0800  BP: (!) 107/57 (!) 107/57  Pulse: 91 91  Resp:  18  Temp:  36.9 C    Last Pain:  Vitals:   06/26/16 0800  TempSrc: Oral  PainSc:       Patients Stated Pain Goal: 3 (06/25/16 2010)  Complications: No apparent anesthesia complications

## 2016-06-26 NOTE — Progress Notes (Signed)
Pt to OR via stretcher.

## 2016-06-26 NOTE — Anesthesia Preprocedure Evaluation (Signed)
Anesthesia Evaluation  Patient identified by MRN, date of birth, ID band Patient awake    Reviewed: Allergy & Precautions, H&P , NPO status , Patient's Chart, lab work & pertinent test results  Airway Mallampati: II  TM Distance: >3 FB Neck ROM: full    Dental no notable dental hx.    Pulmonary former smoker,    Pulmonary exam normal        Cardiovascular negative cardio ROS Normal cardiovascular exam     Neuro/Psych negative neurological ROS     GI/Hepatic negative GI ROS, Neg liver ROS,   Endo/Other  negative endocrine ROS  Renal/GU negative Renal ROS     Musculoskeletal   Abdominal (+) + obese,   Peds  Hematology negative hematology ROS (+)   Anesthesia Other Findings   Reproductive/Obstetrics (+) Pregnancy                             Anesthesia Physical Anesthesia Plan  ASA: II and emergent  Anesthesia Plan: Spinal   Post-op Pain Management:    Induction:   Airway Management Planned:   Additional Equipment:   Intra-op Plan:   Post-operative Plan:   Informed Consent: I have reviewed the patients History and Physical, chart, labs and discussed the procedure including the risks, benefits and alternatives for the proposed anesthesia with the patient or authorized representative who has indicated his/her understanding and acceptance.     Plan Discussed with: CRNA and Surgeon  Anesthesia Plan Comments:         Anesthesia Quick Evaluation

## 2016-06-26 NOTE — Anesthesia Postprocedure Evaluation (Addendum)
Anesthesia Post Note  Patient: [name]  Procedure(s) Performed: Procedure(s) (LRB): CESAREAN SECTION (N/A)  Patient location during evaluation: [facility] Anesthesia Type: Spinal Level of consciousness: oriented and awake and alert Pain management: pain level controlled Vital Signs Assessment: post-procedure vital signs reviewed and stable Respiratory status: spontaneous breathing, respiratory function stable and patient connected to nasal cannula oxygen Cardiovascular status: blood pressure returned to baseline and stable Postop Assessment: no headache, no backache and patient able to bend at knees Anesthetic complications: no        Last Vitals:  Vitals:   06/26/16 1630 06/26/16 1634  BP:  111/85  Pulse: 85 (!) 180  Resp:  18  Temp:  37 C    Last Pain:  Vitals:   06/26/16 1634  TempSrc: Oral  PainSc:    Pain Goal: Patients Stated Pain Goal: 3 (06/25/16 2010)               Rica RecordsICKELTON,ANGELA

## 2016-06-26 NOTE — Anesthesia Procedure Notes (Signed)
Spinal  Patient location during procedure: OR Start time: 06/26/2016 11:32 AM End time: 06/26/2016 11:35 AM Staffing Anesthesiologist: Leilani AbleHATCHETT, Dexter Sauser Performed: anesthesiologist  Preanesthetic Checklist Completed: patient identified, surgical consent, pre-op evaluation, timeout performed, IV checked, risks and benefits discussed and monitors and equipment checked Spinal Block Patient position: sitting Prep: site prepped and draped and DuraPrep Patient monitoring: heart rate, cardiac monitor, continuous pulse ox and blood pressure Approach: midline Location: L3-4 Injection technique: single-shot Needle Needle type: Sprotte  Needle gauge: 24 G Needle length: 9 cm Needle insertion depth: 7 cm Assessment Sensory level: T2

## 2016-06-26 NOTE — Lactation Note (Signed)
This note was copied from a baby's chart. Lactation Consultation Note  Patient Name: Lori Berneice GandyMekierra Asmus WUJWJ'XToday's Date: 06/26/2016 Reason for consult: Initial assessment;NICU baby;Infant < 6lbs   Initial assessment with mom of 8 hour old NICU infant. Mom reports she has started pumping and has not received any breast milk yet. Enc mom to pump every 2-3 hours for 15 minutes on Initiate setting followed by hand expression. Enc mom to take a 4-5 hour stretch at night to rest. Mom reports she has been shown to hand express, enc her to hand express post pumping.   Providing milk for your infant in NICU Booklet given, reviewed pumping and what to expect and breast milk storage for the NICU infant. Mom has colostrum collection containers and # stickers, she is to ask for BM labels when visiting NICU.   BF Recources Handout given, mom reports [facility] [place] [facility] [place] [facility]. Enc mom to call for questions/concerns prn. Mom reports her first child would not latch and she did not pump.    Maternal Data Formula Feeding for Exclusion: No Has patient been taught Hand Expression?: Yes Does the patient have breastfeeding experience prior to this delivery?: No (reports she attempted and infant would not latch)  Feeding    LATCH Score/Interventions                      Lactation Tools Discussed/Used WIC Program: No (Plans to apply) Pump Review: Setup, frequency, and cleaning Initiated by:: Reviewed with mom Date initiated:: 06/26/16   Consult Status Consult Status: Follow-up Date: 06/27/16 Follow-up type: In-patient    Silas FloodSharon S Maiyah Goyne 06/26/2016, 9:50 PM

## 2016-06-26 NOTE — Anesthesia Postprocedure Evaluation (Signed)
Anesthesia Post Note  Patient: Tree surgeonMekierra A Seeley  Procedure(s) Performed: Procedure(s) (LRB): CESAREAN SECTION (N/A)  Patient location during evaluation: PACU Anesthesia Type: Spinal Level of consciousness: awake Pain management: pain level controlled Vital Signs Assessment: post-procedure vital signs reviewed and stable Respiratory status: spontaneous breathing Cardiovascular status: stable Postop Assessment: no headache, no backache, spinal receding, patient able to bend at knees and no signs of nausea or vomiting Anesthetic complications: no        Last Vitals:  Vitals:   06/26/16 1419 06/26/16 1420  BP:  117/67  Pulse: 81 82  Resp: 18   Temp: (!) 38.3 C     Last Pain:  Vitals:   06/26/16 1426  TempSrc:   PainSc: 3    Pain Goal: Patients Stated Pain Goal: 3 (06/25/16 2010)               Elysha Daw JR,JOHN Susann GivensFRANKLIN

## 2016-06-26 NOTE — Op Note (Signed)
Cesarean Section Operative Report  Kayra A Pensyl  06/25/2016 - 06/26/2016  Indications: Bleeding with vaso previa   Pre-operative Diagnosis: bleeding vaso previa.   Post-operative Diagnosis: Same   Surgeon: Surgeon(s) and Role:    * Lesly DukesKelly H Rihan Schueler, MD - Primary    * Michaele OfferElizabeth Woodland Mumaw, DO - Fellow   Attending Attestation: I was present and scrubbed for the entire procedure.   Assistants: Cleda ClarksElizabeth W Mumaw, DO  Anesthesia: spinal    Estimated Blood Loss: 1000 ml  Total IV Fluids: 2000 ml LR  Urine Output:: 100 ml clear urine  Specimens: Placenta  Findings: Viable female infant in frank breech presentation; Apgars 8/9; weight pending; arterial cord pH was unable to be obtained 2/2 clotted; clear amniotic fluid; placenta with three vessel cord; normal uterus, fallopian tubes and ovaries bilaterally.  Baby condition / location:  NICU   Complications: no complications  Indications: Jimmy PicketMekierra A Canino is a 21 y.o. Z6X0960G4P1122 with an IUP 546w5d presenting with acute bleeding with vaso previa.  The risks, benefits, complications, treatment options, and exected outcomes were discussed with the patient . The patient with the proposed plan, giving informed consent. identified as Tree surgeonMekierra A Tofte and the procedure verified as C-Section Delivery.  Procedure Details:  The patient was taken back to the operative suite where spinal anesthesia was placed.  A time out was held and the above information confirmed.   After induction of anesthesia, the patient was draped and prepped in the usual sterile manner and placed in a dorsal supine position with a leftward tilt. A Pfannenstiel incision was made and carried down through the subcutaneous tissue to the fascia. Fascial incision was made and extended transversely. The fascia was separated from the underlying rectus tissue superiorly and inferiorly. The peritoneum was identified and entered and extended longitudinally. Alexis retractor was  placed. A low transverse uterine incision was made and extended bluntly, placenta was noted to be at hysterotomy site and had to go through to delivery baby.  Delivered from [place] frank presentation was a viable infant with Apgars and weight as above.  After waiting 30 seconds for delayed cord cutting, the umbilical cord was clamped and cut cord blood was obtained for evaluation. Cord ph was not sent due to clotted arterial cord blood. The placenta was removed Intact and appeared abnormal - eccentric cord insertion.. The uterine outline, tubes and ovaries appeared normal. The uterine incision was closed with running locked sutures of 0Vicryl with an imbricating layer of the same.   Hemostasis was observed. The peritoneum was partially closed with 0 Vicryl. The rectus muscles were examined and hemostasis observed. The fascia was then reapproximated with running sutures of 0Vicryl. The subcuticular closure was not performed. The skin was closed with 4-0Vicryl.   Instrument, sponge, and needle counts were correct prior the abdominal closure and were correct at the conclusion of the case.    Disposition: PACU - hemodynamically stable.   Maternal Condition: stable       Signed: Jen MowElizabeth Mumaw, DO OB Fellow 06/26/2016 12:48 PM    Attestation of Attending Supervision of Fellow: Evaluation and management procedures were performed by the Fellow under my supervision and collaboration. I have reviewed the Fellow's note and chart, and I agree with the management and plan.  Elsie LincolnKelly Honi Name, MD

## 2016-06-27 LAB — CBC
HCT: 24.8 % — ABNORMAL LOW (ref 36.0–46.0)
HEMOGLOBIN: 8.3 g/dL — AB (ref 12.0–15.0)
MCH: 29.1 pg (ref 26.0–34.0)
MCHC: 33.5 g/dL (ref 30.0–36.0)
MCV: 87 fL (ref 78.0–100.0)
Platelets: 161 10*3/uL (ref 150–400)
RBC: 2.85 MIL/uL — ABNORMAL LOW (ref 3.87–5.11)
RDW: 13.6 % (ref 11.5–15.5)
WBC: 15 10*3/uL — ABNORMAL HIGH (ref 4.0–10.5)

## 2016-06-27 LAB — CULTURE, BETA STREP (GROUP B ONLY)

## 2016-06-27 MED ORDER — INFLUENZA VAC SPLIT QUAD 0.5 ML IM SUSY: 0.5000 mL | PREFILLED_SYRINGE | INTRAMUSCULAR | Status: DC

## 2016-06-27 MED ORDER — FERROUS SULFATE 325 (65 FE) MG PO TABS
325.0000 mg | ORAL_TABLET | Freq: Two times a day (BID) | ORAL | Status: DC
  Administered 2016-06-27 – 2016-06-29 (×4): 325 mg via ORAL
  Filled 2016-06-27 (×4): qty 1

## 2016-06-27 NOTE — Progress Notes (Signed)
Post Partum Day #1 Subjective: no complaints, up ad lib and tolerating PO  Objective: Blood pressure (!) 98/48, pulse 72, temperature 98 F (36.7 C), temperature source Oral, resp. rate 16, height 5\' 1"  (1.549 m), weight 187 lb (84.8 kg), last menstrual period 11/03/2015, SpO2 100 %, unknown if currently breastfeeding.  Physical Exam:  General: alert, cooperative and no distress Lochia: appropriate Uterine Fundus: firm Incision: dressing is c/d/i; pt to remove pressure dressing in shower today. DVT Evaluation: No evidence of DVT seen on physical exam.   Recent Labs  06/26/16 2005 06/27/16 0516  HGB 9.2* 8.3*  HCT 27.3* 24.8*    Assessment/Plan:  POD#1 s/p c/s for bleeding vasa previa Breastfeeding and Contraception possible IUD  Can d/c home tomrrow or Day 3 Iron and colace   LOS: 2 days   Lori Lloyd 06/27/2016, 9:22 AM

## 2016-06-27 NOTE — Plan of Care (Signed)
Problem: Activity: Goal: Will verbalize the importance of balancing activity with adequate rest periods Outcome: Completed/Met Date Met: 06/27/16 Encouraged importance of balancing activity with adequate rest periods and patient verbalized it's importance. Goal: Ability to tolerate increased activity will improve Outcome: Completed/Met Date Met: 06/27/16 Up ad lib without assistance, tolerating activity well.  Problem: Education: Goal: Knowledge of condition will improve Outcome: Completed/Met Date Met: 06/27/16 Condition progressing well and patient is aware of own progress.  Problem: Coping: Goal: Ability to cope will improve Outcome: Progressing Patient coping well postpartum. Goal: Ability to identify and utilize available resources and services will improve Outcome: Progressing Available resources of benefit especially for NICU baby discussed.  Problem: Life Cycle: Goal: Risk for postpartum hemorrhage will decrease Outcome: Completed/Met Date Met: 06/27/16 Uterus firm and contracted.  Vaginal bleeding small in amount. Goal: Chance of risk for complications during the postpartum period will decrease Outcome: Progressing Postpartum risk decreased.  Problem: Nutritional: Goal: Dietary intake will improve Outcome: Completed/Met Date Met: 06/27/16 Regular diet tolerating well. Goal: Mothers verbalization of comfort with breastfeeding process will improve Outcome: Progressing Pumping every 3 hours.  No breastfeeding yet.  Problem: Role Relationship: Goal: Ability to demonstrate positive interaction with newborn will improve Outcome: Progressing Visit her baby in NICU often.  Problem: Pain Management: Goal: General experience of comfort will improve and pain level will decrease Outcome: Completed/Met Date Met: 06/27/16 Good pain control with analgesia of choice.  Problem: Bowel/Gastric: Goal: Gastrointestinal status will improve Outcome: Progressing Abdomen soft.  Good  bowel sounds.  Problem: Respiratory: Goal: Ability to maintain adequate ventilation will improve Outcome: Completed/Met Date Met: 06/27/16 Unlabored breathing.  Lungs clear.  Problem: Skin Integrity: Goal: Demonstration of wound healing without infection will improve Outcome: Not Applicable Date Met: 28/36/62 Unable to assess at this time.  Dressing intact and dry.  Problem: Urinary Elimination: Goal: Ability to reestablish a normal urinary elimination pattern will improve Outcome: Progressing Foley catheter discontinued.  At this time patient due to void.

## 2016-06-27 NOTE — Lactation Note (Signed)
This note was copied from a baby's chart. Lactation Consultation Note  Patient Name: Lori Lloyd ZOXWR'UToday's Date: 06/27/2016 Reason for consult: Follow-up assessment;NICU baby;Infant < 6lbs   Follow up with mom of 29 hour old infant in NICU. Mom reports infant is doing better today. Mom reports she is pumping and starting to get a little colostrum. Enc her to pump every 2-3 hour followed by hand expression. Reviewed milk coming to volume. Enc mom to call with questions/concerns prn.    Maternal Data Formula Feeding for Exclusion: No Has patient been taught Hand Expression?: Yes  Feeding Feeding Type: Formula Nipple Type: Slow - flow Length of feed: 5 min  LATCH Score/Interventions                      Lactation Tools Discussed/Used WIC Program: Yes Pump Review: Setup, frequency, and cleaning   Consult Status Consult Status: Follow-up Date: 06/28/16 Follow-up type: In-patient    Silas FloodSharon S Jamone Garrido 06/27/2016, 5:25 PM

## 2016-06-27 NOTE — Progress Notes (Signed)
Dressing to lower abdominal incision changed secondary current dressing wet after pt showered.

## 2016-06-27 NOTE — Clinical Social Work Maternal (Signed)
CLINICAL SOCIAL WORK MATERNAL/CHILD NOTE  Patient Details  Name: Lori Lloyd MRN: [id number] Date of Birth: 09/12/1995  Date:  06/27/2016  Clinical Social Worker Initiating Note:  Carlus Stay, LCSW Date/ Time Initiated:  06/27/16/1616     Child's Name:  Lori Lloyd   Legal Guardian:  Mother (Lori Lloyd)   Need for Interpreter:  None   Date of Referral:   (no referral-NICU admission)     Reason for Referral:      Referral Source:      Address:  [address] [place], [place] [address]  Phone number:  [phone]   Household Members:  Minor Children (MOB has one other child: Lori Lloyd, age 3)   Natural Supports (not living in the home):  Friends, Immediate Family, Extended Family (MOB reports that her main support people are her siblings, gramma and aunt.)   Professional Supports: None   Employment: Full-time   Type of Work:  (MOB works at [employer])   Education:      Financial Resources:  Medicaid   Other Resources:  WIC, Food Stamps    Cultural/Religious Considerations Which May Impact Care: None stated.  MOB's facesheet notes religion as Methodist.  Strengths:  Ability to meet basic needs  (Wants to transfer daughter to a new pediatrician with baby.  She currently gets care at [facility] and mother is not satisfied here.  MOB does not have supplies at this time and is greatly appreciative of assistance.)   Risk Factors/Current Problems:   (MOB had positive UDS for THC in pregnancy-did not get to discuss at this time.)   Cognitive State:  Alert , Able to Concentrate , Insightful , Linear Thinking , Goal Oriented    Mood/Affect:  Happy , Interested , Calm , Comfortable    CSW Assessment: CSW met with MOB and her sister in MOB's third floor room/320 to introduce services, offer support, and complete assessment due to baby's admission to NICU at 33.5 weeks.  CSW notes, upon chart review, that MOB had a positive screen for marijuana  on 06/18/16.  CSW plans to follow up on this issue as MOB's elderly grandmother came into the room before CSW had a chance to address this with MOB.  MOB was pleasant and welcoming.  She presented in good spirits and was easy to engage. MOB seems happy about baby and states she is pleased with how well he is doing.  She feels she has a good understanding about baby's medical needs and thinks she is coping well at this time.  She spoke about when she thinks he might be able to go home and CSW encouraged her to focus on baby, rather than his surroundings or date of discharge.  MOB's sister agreed.  MOB states she understands that he needs to be here and she is not rushing him, but is concerned about wasting her time off from work while he is in the hospital.  CSW encouraged her to talk with her employer to see if she can return when she is medically cleared and save time for when baby is discharged.  Since MOB had a c-section, however, she may not be cleared to return to work prior to baby's discharge.  MOB was understanding and knows she needs to speak with her OB about medical clearance to return to work.  MOB states she has been with her employer for over a year, but has been a full time employee there since April 2017.    MOB spoke at length about her finances and goals for remaining self sufficient.  She is unsure about daycare for baby once she returns to work and is worried about him going to daycare since he was born prematurely during fly season.  She is considering her options. MOB reports that she has a good support system and that she is "leaving the door open" for FOB to be involved, but that he currently is only minimally involved.  She states he would like a paternity test.  CSW explained hospital policy and MOB gives consent for baby to be swabbed.  CSW explained that as long as he is not on a ventilator and they understand that testing is not completed by the hospital, she can bring a representative  from a paternity testing agency into the unit with her and ask that baby's RN swab baby's cheeks.  MOB stated understanding.  She understands that the cost is completely her responsibility.   MOB states she has not gotten supplies for baby and is concerned about finances because she just paid rent and car insurance, leaving her with little money left at this time. CSW offered resources from [facility] [employer] [facility], which MOB was greatly appreciative and accepting of.  CSW will make referral.  CSW inquired about MOB's mental health after her first baby.  She states she thinks she had some PPD, but relates it mostly to the situation with her first baby's father.  CSW provided education regarding signs and symptoms of PMADs and stressed the importance of talking with a medical professional if she has concerns about her emotional health at any time.  MOB agreed.  CSW provided resources. CSW explained ongoing support services offered by NICU CSW and gave contact information.  MOB seemed very appreciative of the visit. CSW will follow up regarding drug screen.  Baby's UDS is negative.  CSW Plan/Description:  Information/Referral to Community Resources , Patient/Family Education , Psychosocial Support and Ongoing Assessment of Needs    Lori Bresnan Elizabeth, LCSW 06/27/2016, 4:20 PM 

## 2016-06-28 LAB — RPR: RPR Ser Ql: NONREACTIVE

## 2016-06-28 LAB — GC/CHLAMYDIA PROBE AMP (~~LOC~~) NOT AT ARMC
Chlamydia: NEGATIVE
Neisseria Gonorrhea: NEGATIVE

## 2016-06-28 NOTE — Progress Notes (Signed)
Subjective: Postpartum Day 2: Cesarean Delivery for Vasa Previa Patient reports incisional pain and tolerating PO.   Pt grateful for diagnosis and action of Emergent-delivery Objective: Vital signs in last 24 hours: Temp:  [98 F (36.7 C)-98.5 F (36.9 C)] 98 F (36.7 C) (01/08 0822) Pulse Rate:  [70-81] 71 (01/08 0822) Resp:  [18] 18 (01/08 0822) BP: (92-130)/(43-79) 130/60 (01/08 0822) SpO2:  [99 %-100 %] 100 % (01/08 16100822)  Physical Exam:  General: alert, cooperative and appears stated age Lochia: appropriate Uterine Fundus: firm Incision: healing well, no significant drainage DVT Evaluation: No evidence of DVT seen on physical exam.   Recent Labs  06/26/16 2005 06/27/16 0516  HGB 9.2* 8.3*  HCT 27.3* 24.8*    Assessment/Plan: Status post Cesarean section. Doing well postoperatively. and baby's fine. Continue current care with d/c Tuesday.  Lori Lloyd V 06/28/2016, 10:02 AM

## 2016-06-28 NOTE — Lactation Note (Signed)
This note was copied from a baby's chart. Lactation Consultation Note  Patient Name: Lori Lloyd UEAVW'UToday's Date: 06/28/2016  Follow up visit made.  Mom states she is pumping every 2-3 hours during the day and 4 hours at night.  Milk is transitional and she currently pumping 20 mls.  Instructed to change pump setting to standard and pump until milk stops.  She is concerned about engorgement because she experienced in the past.  Encouraged to call out with concerns/assist.   Maternal Data    Feeding Feeding Type: Formula Nipple Type: Slow - flow Length of feed: 10 min  LATCH Score/Interventions                      Lactation Tools Discussed/Used     Consult Status      Huston FoleyMOULDEN, Janmarie Smoot S 06/28/2016, 9:49 AM

## 2016-06-28 NOTE — Progress Notes (Signed)
Attempted visit to introduce spiritual care services and offer the support for patient during her hospital stay and her baby's NICU stay.  Pt was unavailable at the moment.  Will continue to follow.  Please page as further needs arise.  Maryanna ShapeAmanda M. Carley Hammedavee Lomax, M.Div. [facility] Chaplain Pager [phone] Office [phone]

## 2016-06-28 NOTE — Progress Notes (Signed)
CSW met with MOB to see how her night was and evaluate how MOB is coping this morning and follow up regarding positive marijuana UDS in pregnancy.  MOB was pumping, welcomed CSW into the room, and appeared to be in good spirits.  She states that her night went well and that she is feeling very positive this morning.   MOB was open with CSW regarding discussion of marijuana and thanked CSW for not asking her about this with her grandmother in the room yesterday.  MOB states that she "has had a lot going on and was feeling stressed."  She states only occasional use and that since she is feeling so good, she does not plan to continue smoking.  CSW discussed emotions and self medicating with marijuana.  CSW encouraged MOB to seek mental health care at one of the resources CSW provided her with yesterday if she is feeling depressed.  MOB agreed and thanked CSW.  CSW informed MOB of hospital drug screen policy and baby's negative UDS.  CSW will follow CDS results and notify CPS accordingly.  

## 2016-06-29 DIAGNOSIS — Z98891 History of uterine scar from previous surgery: Secondary | ICD-10-CM

## 2016-06-29 LAB — TYPE AND SCREEN
ABO/RH(D): O POS
ANTIBODY SCREEN: NEGATIVE
UNIT DIVISION: 0
Unit division: 0

## 2016-06-29 MED ORDER — OXYCODONE HCL 5 MG PO TABS: 5.0000 mg | ORAL_TABLET | ORAL | 0 refills | Status: DC | PRN

## 2016-06-29 MED ORDER — IBUPROFEN 600 MG PO TABS
600.0000 mg | ORAL_TABLET | Freq: Four times a day (QID) | ORAL | 0 refills | Status: DC | PRN
Start: 2016-06-29 — End: 2021-11-25

## 2016-06-29 NOTE — Progress Notes (Signed)
Pt. Is discharged in the care of Family. Downstairs per ambulatory toNicu awaiting transportation home. Denies any pain or discomfort. Abdominal incision is clean and dry. Understands all instructions well. Questions asked and answered.Stable

## 2016-06-29 NOTE — Discharge Instructions (Signed)

## 2016-06-29 NOTE — Lactation Note (Signed)
This note was copied from a baby's chart. Lactation Consultation Note  Patient Name: Boy Berneice GandyMekierra Richins ZOXWR'UToday's Date: 06/29/2016  Mom is pumping 120 mls from each breast every 2-3 hours.  Breasts are comfortable.  She will call WIC to arrange for a pump.  I will follow up later.   Maternal Data    Feeding Feeding Type: Breast Milk Nipple Type: Slow - flow Length of feed: 25 min  LATCH Score/Interventions                      Lactation Tools Discussed/Used     Consult Status      Huston FoleyMOULDEN, Emalyn Schou S 06/29/2016, 10:55 AM

## 2016-06-29 NOTE — Discharge Summary (Signed)
OB Discharge Summary     Patient Name: Lori PicketMekierra A Lloyd DOB: [birth year] MRN: [id number]  Date of admission: 06/25/2016 Delivering MD: Elsie LincolnLEGGETT, KELLY H   Date of discharge: 06/29/2016  Admitting diagnosis: 34WKS,BLEEDING Intrauterine pregnancy: 3628w5d     Secondary diagnosis:  Active Problems:   Vaginal bleeding   Status post cesarean section  Additional problems: Vasoprevia     Discharge diagnosis: Preterm Pregnancy Delivered                                                                                                Post partum procedures:none  Complications: None  Hospital course:  Sceduled C/S   21 y.o. yo [id number] at 2228w5d was admitted to the hospital 06/25/2016 for scheduled cesarean section with the following indication:Previa.  Membrane Rupture Time/Date: 11:58 AM ,06/26/2016   Patient delivered a Viable infant.06/26/2016  Details of operation can be found in separate operative note.  Pateint had an uncomplicated postpartum course.  She is ambulating, tolerating a regular diet, passing flatus, and urinating well. Patient is discharged home in stable condition on  06/29/16          Physical exam Vitals:   06/28/16 0420 06/28/16 0822 06/28/16 1422 06/28/16 1900  BP: (!) 92/43 130/60 (!) 104/58 (!) 110/57  Pulse: 73 71 78 82  Resp: 18 18 18 20   Temp: 98.2 F (36.8 C) 98 F (36.7 C) 98.1 F (36.7 C) 98.5 F (36.9 C)  TempSrc: Oral Oral Oral Oral  SpO2: 99% 100% 100% 100%  Weight:      Height:       General: alert, cooperative and no distress Lochia: appropriate Uterine Fundus: firm Incision: Dressing is clean, dry, and intact DVT Evaluation: No evidence of DVT seen on physical exam. Negative Homan's sign. Labs: Lab Results  Component Value Date   WBC 15.0 (H) 06/27/2016   HGB 8.3 (L) 06/27/2016   HCT 24.8 (L) 06/27/2016   MCV 87.0 06/27/2016   PLT 161 06/27/2016   CMP Latest Ref Rng & Units 06/25/2014  Glucose 70 - 99 mg/dL 87  BUN 6 - 23 mg/dL 11   Creatinine 1.470.50 - 1.10 mg/dL 8.29(F0.45(L)  Sodium 621135 - 308145 mmol/L 137  Potassium 3.5 - 5.1 mmol/L 3.6  Chloride 96 - 112 mEq/L 106  CO2 19 - 32 mmol/L 22  Calcium 8.4 - 10.5 mg/dL 8.8  Total Protein 6.0 - 8.3 g/dL 7.3  Total Bilirubin 0.3 - 1.2 mg/dL 1.2  Alkaline Phos 39 - 117 U/L 55  AST 0 - 37 U/L 28  ALT 0 - 35 U/L 28    Discharge instruction: per After Visit Summary and "Baby and Me Booklet".  After visit meds:  Allergies as of 06/29/2016      Reactions   Shellfish Allergy Anaphylaxis      Medication List    STOP taking these medications   metroNIDAZOLE 500 MG tablet Commonly known as:  FLAGYL     TAKE these medications   albuterol 108 (90 Base) MCG/ACT inhaler Commonly known as:  PROVENTIL HFA;VENTOLIN HFA Inhale 1-2 puffs into the  lungs every 6 (six) hours as needed for wheezing or shortness of breath.   docusate sodium 100 MG capsule Commonly known as:  COLACE Take 1 capsule (100 mg total) by mouth 2 (two) times daily as needed.   ibuprofen 600 MG tablet Commonly known as:  ADVIL,MOTRIN Take 1 tablet (600 mg total) by mouth every 6 (six) hours as needed for mild pain.   oxyCODONE 5 MG immediate release tablet Commonly known as:  Oxy IR/ROXICODONE Take 1 tablet (5 mg total) by mouth every 4 (four) hours as needed (pain scale 4-7).   polyethylene glycol powder powder Commonly known as:  GLYCOLAX/MIRALAX Take 17 g by mouth daily.   prenatal multivitamin Tabs tablet Take 1 tablet by mouth daily at 12 noon.       Diet: routine diet  Activity: Advance as tolerated. Pelvic rest for 6 weeks.   Outpatient follow up:6 weeks Follow up Appt:Future Appointments Date Time Provider Department Center  08/10/2016 8:20 AM Marylene Land, [place] [facility]   Follow up Visit:No Follow-up on file.  Postpartum contraception: Depo Provera  Newborn Data: Live born female  Birth Weight: 4 lb 13.6 oz (2200 g) APGAR: 8, 9  Baby Feeding:  Breast Disposition:NICU   06/29/2016 Levie Heritage, DO

## 2016-06-30 ENCOUNTER — Ambulatory Visit (HOSPITAL_COMMUNITY): Payer: Medicaid Other

## 2016-07-05 ENCOUNTER — Telehealth: Payer: Self-pay | Admitting: *Deleted

## 2016-07-05 NOTE — Telephone Encounter (Addendum)
Pt left message on 1/12 stating that she needs proof for her job that she had her baby and will be out for 6 weeks.   1/16  1400  I spoke w/pt and informed her that I can create a note stating that she will need to be out of work for 8 weeks following the birth of her baby. This amount of time may be extended depending on her baby's progress and once it is determined when baby will be discharged. (baby currently in NICU)  Pt voiced understanding and will pick up the letter today.

## 2016-07-06 ENCOUNTER — Telehealth: Payer: Self-pay | Admitting: General Practice

## 2016-07-06 ENCOUNTER — Encounter: Payer: Self-pay | Admitting: *Deleted

## 2016-07-06 NOTE — Telephone Encounter (Signed)
Opened in error

## 2016-07-07 ENCOUNTER — Encounter: Payer: Self-pay | Admitting: Advanced Practice Midwife

## 2016-07-09 ENCOUNTER — Ambulatory Visit: Payer: Self-pay

## 2016-07-09 NOTE — Lactation Note (Signed)
This note was copied from a baby's chart. Lactation Consultation Note  Patient Name: Boy Berneice GandyMekierra Sanz ZOXWR'UToday's Date: 07/09/2016  Mom is still pumping with a hand pump although she used the DEBP during the night.  She obtains 120 mls from each breast in the AM and 60 mls from each breast about 4 more pumpings/day.  [facility] but office was closed due to weather.  Encouraged to call today to reschedule.  Reviewed importance of pumping at least 8 times/24 hours to maintain a good milk supply.  Mom will continue to nuzzle baby at breast.  Lactation outpatient services and support information reviewed and encouraged.  Mom states she will call for an outpatient appointment.   Maternal Data    Feeding    LATCH Score/Interventions                      Lactation Tools Discussed/Used     Consult Status      Huston FoleyMOULDEN, Alexandr Oehler S 07/09/2016, 10:04 AM

## 2016-07-13 ENCOUNTER — Telehealth: Payer: Self-pay | Admitting: *Deleted

## 2016-07-13 NOTE — Telephone Encounter (Signed)
Patient left message on nurse voicemail on 07/12/16 at 1117.  Patient requesting to have notes faxed to her job.  Requests a return call to [phone].

## 2016-07-15 NOTE — Telephone Encounter (Signed)
LM that we are returning her call to please give us a call back.  

## 2016-07-27 ENCOUNTER — Telehealth: Payer: Self-pay | Admitting: *Deleted

## 2016-07-27 NOTE — Telephone Encounter (Signed)
Patient left message, wants a refill on her prescription. Did not say which rx.

## 2016-07-27 NOTE — Telephone Encounter (Signed)
Called patient- no answer or voicemail to leave a message. 

## 2016-07-28 NOTE — Telephone Encounter (Signed)
Called pt and she stated that she would like a refill of Oxycodone. She has not been taking ibuprofen. I explained that since her surgery was on 1/6, our doctors will not refill the narcotic medication. She may take the ibuprofen as prescribed. If she has severe pain, she will need evaluation @ [facility].  Pt voiced understanding.

## 2016-08-10 ENCOUNTER — Ambulatory Visit: Payer: Self-pay | Admitting: Student

## 2016-08-11 ENCOUNTER — Ambulatory Visit: Payer: Self-pay | Admitting: Student

## 2016-08-12 ENCOUNTER — Telehealth: Payer: Self-pay | Admitting: *Deleted

## 2016-08-12 NOTE — Telephone Encounter (Signed)
I called Lori Lloyd back and we discussed we can only provide her a note if there is a medical reason she cannot return to work and since it has been over 6 weeks we can not give her a note to extend her leave since she is not having any medical complications. I explained to her she can discuss with pediatrician and they may be able to give  Her a note if they feel her baby should not go to day care yet because of prematurity. She voices understanding.

## 2016-08-12 NOTE — Telephone Encounter (Signed)
Lori Lloyd called yesterday afternoon and left a message that she was scheduled for a postpartum visit but had to be rescheduled because she had her kids with her.  States she doesn't have childcare and her only need for her postpartum visit was to see if she could get a doctors note to extend . States she had a premature baby who just passed his due date. Would like a call back.

## 2016-09-30 ENCOUNTER — Inpatient Hospital Stay (HOSPITAL_COMMUNITY)
Admission: AD | Admit: 2016-09-30 | Discharge: 2016-10-01 | Disposition: A | Discharge status: Home / Self Care | Payer: Medicaid Other | Source: Ambulatory Visit | Attending: Obstetrics & Gynecology | Admitting: Obstetrics & Gynecology

## 2016-09-30 ENCOUNTER — Encounter (HOSPITAL_COMMUNITY): Payer: Self-pay | Admitting: *Deleted

## 2016-09-30 DIAGNOSIS — J069 Acute upper respiratory infection, unspecified: Secondary | ICD-10-CM | POA: Insufficient documentation

## 2016-09-30 DIAGNOSIS — J45909 Unspecified asthma, uncomplicated: Secondary | ICD-10-CM | POA: Insufficient documentation

## 2016-09-30 DIAGNOSIS — Z91013 Allergy to seafood: Secondary | ICD-10-CM | POA: Insufficient documentation

## 2016-09-30 DIAGNOSIS — Z79899 Other long term (current) drug therapy: Secondary | ICD-10-CM | POA: Insufficient documentation

## 2016-09-30 DIAGNOSIS — R05 Cough: Secondary | ICD-10-CM | POA: Insufficient documentation

## 2016-09-30 DIAGNOSIS — F17210 Nicotine dependence, cigarettes, uncomplicated: Secondary | ICD-10-CM | POA: Insufficient documentation

## 2016-09-30 DIAGNOSIS — B9789 Other viral agents as the cause of diseases classified elsewhere: Secondary | ICD-10-CM

## 2016-09-30 LAB — URINALYSIS, ROUTINE W REFLEX MICROSCOPIC
BILIRUBIN URINE: NEGATIVE
Bacteria, UA: NONE SEEN
Glucose, UA: NEGATIVE mg/dL
HGB URINE DIPSTICK: NEGATIVE
Ketones, ur: NEGATIVE mg/dL
LEUKOCYTES UA: NEGATIVE
NITRITE: NEGATIVE
PH: 5 (ref 5.0–8.0)
PROTEIN: 30 mg/dL — AB
Specific Gravity, Urine: 1.032 — ABNORMAL HIGH (ref 1.005–1.030)

## 2016-09-30 LAB — POCT PREGNANCY, URINE: Preg Test, Ur: NEGATIVE

## 2016-09-30 MED ORDER — IPRATROPIUM-ALBUTEROL 0.5-2.5 (3) MG/3ML IN SOLN
3.0000 mL | Freq: Once | RESPIRATORY_TRACT | Status: AC
  Administered 2016-09-30: 3 mL via RESPIRATORY_TRACT
  Filled 2016-09-30: qty 3

## 2016-09-30 MED ORDER — DM-GUAIFENESIN ER 30-600 MG PO TB12
1.0000 | ORAL_TABLET | Freq: Once | ORAL | Status: AC
  Administered 2016-09-30: 1 via ORAL
  Filled 2016-09-30: qty 1

## 2016-09-30 NOTE — [facility] Note (Signed)
PT  SAYS SHE  HAD  C/S  ON 1-6-     BREAST FEEDING-  NO CYCLE   SINCE  DEL.    NO BIRTH CONTROL. LAST SEX- 2 WEEKS AGO-   WITH  CONDOMS.    SAYS TROUBLE BREATHING- FEELS  TIGHT  IN CHEST -  HAS COUGH-  NON- PRODUCTIVE .  NO FEVER.  NO VOMITING

## 2016-09-30 NOTE — MAU Provider Note (Signed)
History     CSN: [id number]  Arrival date and time: 09/30/16 2210   First Provider Initiated Contact with Patient 09/30/16 2300      Chief Complaint  Patient presents with  . Cough   Cough  This is a new problem. The current episode started today. The problem has been unchanged. The cough is non-productive. Associated symptoms include a sore throat (yesterday, but not today ) and wheezing. Pertinent negatives include no chills, ear pain or fever. The symptoms are aggravated by pollens. Risk factors: smoker  She has tried nothing for the symptoms. Her past medical history is significant for asthma.    Past Medical History:  Diagnosis Date  . Asthma   . Depression   . Eczema   . Fracture    ring finger right hand    Past Surgical History:  Procedure Laterality Date  . CESAREAN SECTION N/A 06/26/2016   Procedure: CESAREAN SECTION;  Surgeon: Lesly Dukes, MD;  Location: [facility] BIRTHING SUITES;  Service: Obstetrics;  Laterality: N/A;  . INDUCED ABORTION    . WISDOM TOOTH EXTRACTION      Family History  Problem Relation Age of Onset  . Diabetes Maternal Grandmother   . Hypertension Maternal Grandmother   . Hearing loss Neg Hx     Social History  Substance Use Topics  . Smoking status: Current Every Day Smoker    Packs/day: 0.25    Years: 0.50    Types: Cigarettes  . Smokeless tobacco: Never Used     Comment: with preg  . Alcohol use No    Allergies:  Allergies  Allergen Reactions  . Shellfish Allergy Anaphylaxis    Prescriptions Prior to Admission  Medication Sig Dispense Refill Last Dose  . albuterol (PROVENTIL HFA;VENTOLIN HFA) 108 (90 Base) MCG/ACT inhaler Inhale 1-2 puffs into the lungs every 6 (six) hours as needed for wheezing or shortness of breath. 1 Inhaler 1 rescue  . docusate sodium (COLACE) 100 MG capsule Take 1 capsule (100 mg total) by mouth 2 (two) times daily as needed. (Patient not taking: Reported on 06/25/2016) 30 capsule 2 More than a month at  Unknown time  . ibuprofen (ADVIL,MOTRIN) 600 MG tablet Take 1 tablet (600 mg total) by mouth every 6 (six) hours as needed for mild pain. 30 tablet 0 More than a month at Unknown time  . oxyCODONE (OXY IR/ROXICODONE) 5 MG immediate release tablet Take 1 tablet (5 mg total) by mouth every 4 (four) hours as needed (pain scale 4-7). 20 tablet 0 More than a month at Unknown time  . polyethylene glycol powder (GLYCOLAX/MIRALAX) powder Take 17 g by mouth daily. (Patient not taking: Reported on 06/25/2016) 255 g 0 More than a month at Unknown time  . Prenatal Vit-Fe Fumarate-FA (PRENATAL MULTIVITAMIN) TABS tablet Take 1 tablet by mouth daily at 12 noon. (Patient not taking: Reported on 06/25/2016) 30 tablet 12 More than a month at Unknown time    Review of Systems  Constitutional: Negative for chills and fever.  HENT: Positive for sore throat (yesterday, but not today ). Negative for congestion, ear pain and sneezing.   Respiratory: Positive for cough and wheezing.    Physical Exam   Blood pressure 111/66, pulse 97, temperature 99.9 F (37.7 C), temperature source Oral, resp. rate 20, height  (1.549 m), weight 66.1 kg (145 lb 12 oz), SpO2 96 %, unknown if currently breastfeeding.  Physical Exam  Nursing note and vitals reviewed. Constitutional: She is oriented to person,  place, and time. She appears well-developed and well-nourished. No distress.  HENT:  Head: Normocephalic.  Cardiovascular: Normal rate.   Respiratory: Effort normal. No respiratory distress. She has no wheezes. She has no rales. She exhibits no tenderness.  GI: Soft. There is no tenderness.  Neurological: She is alert and oriented to person, place, and time.  Skin: Skin is warm and dry.  Psychiatric: She has a normal mood and affect.   Results for orders placed or performed during the hospital encounter of 09/30/16 (from the past 24 hour(s))  Urinalysis, Routine w reflex microscopic     Status: Abnormal   Collection Time:  09/30/16 10:24 PM  Result Value Ref Range   Color, Urine YELLOW YELLOW   APPearance HAZY (A) CLEAR   Specific Gravity, Urine 1.032 (H) 1.005 - 1.030   pH 5.0 5.0 - 8.0   Glucose, UA NEGATIVE NEGATIVE mg/dL   Hgb urine dipstick NEGATIVE NEGATIVE   Bilirubin Urine NEGATIVE NEGATIVE   Ketones, ur NEGATIVE NEGATIVE mg/dL   Protein, ur 30 (A) NEGATIVE mg/dL   Nitrite NEGATIVE NEGATIVE   Leukocytes, UA NEGATIVE NEGATIVE   RBC / HPF 0-5 0 - 5 RBC/hpf   WBC, UA 6-30 0 - 5 WBC/hpf   Bacteria, UA NONE SEEN NONE SEEN   Squamous Epithelial / LPF 0-5 (A) NONE SEEN   Mucous PRESENT   Pregnancy, urine POC     Status: None   Collection Time: 09/30/16 11:51 PM  Result Value Ref Range   Preg Test, Ur NEGATIVE NEGATIVE    MAU Course  Procedures  MDM 2356: Patient has had breathing treatment and mucinex. She reports that she is feeling better at this time. Worried about when/if symptoms return. Will dc home with albuterol inhaler, antihistamine and mucinex.   Assessment and Plan   1. Viral URI with cough    DC home Comfort measures reviewed  RX: Mucinex/DM BID, Claritin QD, Albuterol inhaler q4 hours PRN    Follow-up Information    [facility] Follow up.   Specialty:  Urgent Care Why:  If symptoms worsen or do not improve  Contact information: [address] [place] [address] [phone]           Tawnya Crook 09/30/2016, 11:04 PM

## 2016-10-01 DIAGNOSIS — J069 Acute upper respiratory infection, unspecified: Secondary | ICD-10-CM

## 2016-10-01 DIAGNOSIS — B9789 Other viral agents as the cause of diseases classified elsewhere: Secondary | ICD-10-CM

## 2016-10-01 MED ORDER — LORATADINE 10 MG PO TABS: 10.0000 mg | ORAL_TABLET | Freq: Every day | ORAL | 0 refills | Status: DC

## 2016-10-01 MED ORDER — ALBUTEROL SULFATE HFA 108 (90 BASE) MCG/ACT IN AERS: 1.0000 | INHALATION_SPRAY | RESPIRATORY_TRACT | 0 refills | Status: DC | PRN

## 2016-10-01 MED ORDER — DM-GUAIFENESIN ER 30-600 MG PO TB12: 1.0000 | ORAL_TABLET | Freq: Two times a day (BID) | ORAL | 0 refills | Status: DC

## 2016-10-01 NOTE — Discharge Instructions (Signed)
Antibiotic Resistance Antibiotics are medicines used to treat infections that are caused by bacteria. Antibiotic resistance means that the medicine no longer works against the bacteria. Resistance can develop if you use antibiotics the wrong way. When antibiotics are given in response to illnesses caused by viruses, like colds or the flu, many normal bacteria in the body are killed. Some bacteria that are not killed may develop resistance to the antibiotic. These bacteria may grow and cause infections that are resistant to some other antibiotics. If this happens, the bacteria can continue to grow and cause infection. What are the causes? Antibiotic resistance happens when bacteria come into contact with an antibiotic over and over again. Over time, the bacteria become resistant to the antibiotic. What increases the risk? This condition is more likely to develop in people who:  Are repeatedly given antibiotics to treat viral infections.  Do not take their antibiotic medicine as prescribed, such as not finishing all of the medicine.  Need to take antibiotics often because of a long-term medical condition.  Take medicines that weaken their body's defense system (immune system).  Have surgery.  Are elderly.  Need a procedure that replaces some of the work that healthy kidneys do (dialysis).  Have an organ transplant.  Are being treated for cancer.  Have a type of infection that is more likely to be caused by resistant bacteria. These include certain:  Skin infections.  STIs (sexually transmitted infections).  Respiratory infections.  Infections of the lining of the brain and spinal cord (meningitis).  Gastrointestinal infections.  Eat foods from animals that were treated with antibiotics. Antibiotic-resistant bacteria can be passed through the food.  Live with or care for someone with an antibiotic-resistant infection.  Are hospitalized for a long timeor live in a long-term  care facility. What are the signs or symptoms? The main symptom of this condition is having an infection that does not improve with normal treatment. The specific signs and symptoms that you have will depend on the type of infection, but they may include:  A fever.  Warmth, redness, and tenderness around a wound or incision.  Brown, yellow, or green drainage from a wound or incision.  A bad smell coming from a wound or incision.  Nausea, vomiting, and abdominal pain. How is this diagnosed? This condition may be diagnosed by:  Your medical history. Your health care provider may suspect antibiotic resistance if your condition does not improve after you have been treated for an infection.  You may also have other tests, including:  Analysis of a fluid or stool sample. This is done to identify bacteria under a microscope and determine what type of antibiotic will work against them (culture and sensitivity).  Other blood tests and imaging tests. These are done to check if your infection has spread or has become more serious. How is this treated? Treatment for this condition depends on the nature of the specific infection.  Treatment may include oral antibiotics that kill more types of bacteria (broad spectrum).  Serious antibiotic-resistant infections may need to be treated in the hospital. In severe cases, this may include:  Surgery to remove infected or damaged tissue.  Antibiotics or other medicines given through an IV tube. Follow these instructions at home: Taking antibiotics correctly   Understand when antibiotics are needed and when they are not needed.  Do not ask for an antibiotic prescription if you have been diagnosed with a viral illness. Antibiotic medicine will not make your illness go  away faster. Common viral illnesses include an ear infection, a sinus infection, the stomach flu, or bronchitis.  Do not take antibiotics that are left over from a previous  prescription.  Do not take antibiotics that were prescribed for someone else.  If you are prescribed an antibiotic:  Take it exactly as told by your health care provider. Do not stop taking the medicine even if you start to feel better.  If you have been taking it for more than 10 days, ask your health care provider or pharmacist if you should keep taking it.  Do not save unused antibiotics to use at a later date. Get rid of unused medicine as told by your health care provider or pharmacist. Preventing infection   If you have an infection, avoid close contact with people around you.  Avoid using personal items that are used by other people, such as towels, razors, or bedding.  Regularly disinfect doorknobs, food preparation surfaces, and bathrooms with bleach-containing products or solutions.  Wash your hands with soap and water:  After using the bathroom.  Before and after caring for a wound or incision.  Before and after preparing food. General instructions   Stay up-to-date on vaccinations. Many vaccines prevent illnesses that are treated with antibiotics. Contact a health care provider if:  You have a fever or chills.  You are taking a new antibiotic and you are not getting better after a few days.  You have three or more periods of diarrhea after starting a new antibiotic.  You have increased warmth, redness, or tenderness around a wound.  You have brown, yellow, or green drainage from a wound or incision.  You have a bad-smelling wound or incision.  You have new or worsening nausea, vomiting, or abdominal pain. Get help right away if:  You develop a rash.  Your mouth or tongue itches.  You have a tight feeling in your throat.  You have trouble breathing.  You have chest pain or tightness.  You are dizzy or you faint. This information is not intended to replace advice given to you by your health care provider. Make sure you discuss any questions you have  with your health care provider. Document Released: 08/28/2002 Document Revised: 12/26/2015 Document Reviewed: 11/10/2015 Elsevier Interactive Patient Education  2017 Elsevier Inc.  [employer] Vaporizer A cool mist vaporizer is a device that releases a cool mist into the air. If you have a cough or a cold, using a vaporizer may help relieve your symptoms. The mist adds moisture to the air, which may help thin your mucus and make it less sticky. When your mucus is thin and less sticky, it easier for you to breathe and to cough up secretions. Do not use a vaporizer if you are allergic to mold. Follow these instructions at home:  Follow the instructions that come with the vaporizer.  Do not use anything other than distilled water in the vaporizer.  Do not run the vaporizer all of the time. Doing that can cause mold or bacteria to grow in the vaporizer.  Clean the vaporizer after each time that you use it.  Clean and dry the vaporizer well before storing it.  Stop using the vaporizer if your breathing symptoms get worse. This information is not intended to replace advice given to you by your health care provider. Make sure you discuss any questions you have with your health care provider. Document Released: 03/04/2004 Document Revised: 12/26/2015 Document Reviewed: 09/06/2015 Elsevier Interactive Patient Education  2017 Elsevier Inc. Upper Respiratory Infection, Adult Most upper respiratory infections (URIs) are caused by a virus. A URI affects the nose, throat, and upper air passages. The most common type of URI is often called "the common cold." Follow these instructions at home:  Take medicines only as told by your doctor.  Gargle warm saltwater or take cough drops to comfort your throat as told by your doctor.  Use a warm mist humidifier or inhale steam from a shower to increase air moisture. This may make it easier to breathe.  Drink enough fluid to keep your pee (urine) clear or  pale yellow.  Eat soups and other clear broths.  Have a healthy diet.  Rest as needed.  Go back to work when your fever is gone or your doctor says it is okay.  You may need to stay home longer to avoid giving your URI to others.  You can also wear a face mask and wash your hands often to prevent spread of the virus.  Use your inhaler more if you have asthma.  Do not use any tobacco products, including cigarettes, chewing tobacco, or electronic cigarettes. If you need help quitting, ask your doctor. Contact a doctor if:  You are getting worse, not better.  Your symptoms are not helped by medicine.  You have chills.  You are getting more short of breath.  You have brown or red mucus.  You have yellow or brown discharge from your nose.  You have pain in your face, especially when you bend forward.  You have a fever.  You have puffy (swollen) neck glands.  You have pain while swallowing.  You have white areas in the back of your throat. Get help right away if:  You have very bad or constant:  Headache.  Ear pain.  Pain in your forehead, behind your eyes, and over your cheekbones (sinus pain).  Chest pain.  You have long-lasting (chronic) lung disease and any of the following:  Wheezing.  Long-lasting cough.  Coughing up blood.  A change in your usual mucus.  You have a stiff neck.  You have changes in your:  Vision.  Hearing.  Thinking.  Mood. This information is not intended to replace advice given to you by your health care provider. Make sure you discuss any questions you have with your health care provider. Document Released: 11/24/2007 Document Revised: 02/08/2016 Document Reviewed: 09/12/2013 Elsevier Interactive Patient Education  2017 [employer].

## 2016-11-26 NOTE — Addendum Note (Signed)
Addendum  created 11/26/16 0857 by Cassius Cullinane, MD   Sign clinical note    

## 2017-09-21 IMAGING — US US OB COMP LESS 14 WK
1 series · 15 of 28 positions shown · non-contrast
Comparison: No prior scans from this gestation.

CLINICAL DATA: 19-year-old pregnant female presents with pelvic
pain.

EDC by LMP: 08/09/2016, projecting to an expected gestational age of
5 weeks 5 days.
EXAM:
OBSTETRIC <14 WK US AND TRANSVAGINAL OB US
TECHNIQUE: Both transabdominal and transvaginal ultrasound examinations were
performed for complete evaluation of the gestation as well as the
maternal uterus, adnexal regions, and pelvic cul-de-sac.
Transvaginal technique was performed to assess early pregnancy.

[Series 1: us ob comp less 14 wk · 36 acquisitions, 15 frames shown]
[im 1/36]
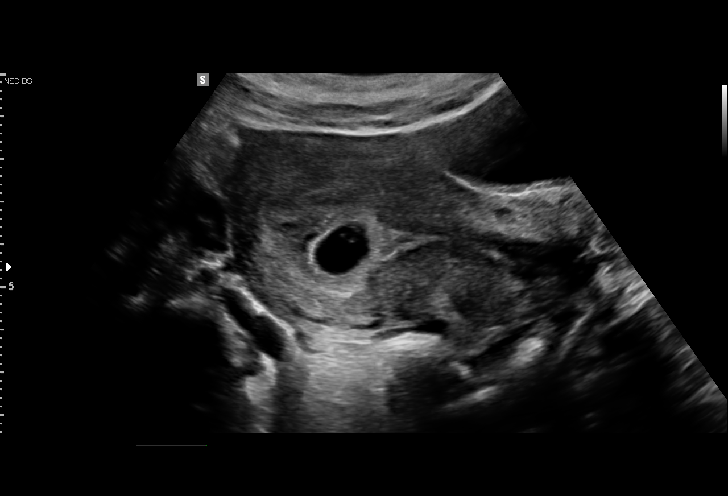
[im 3/36]
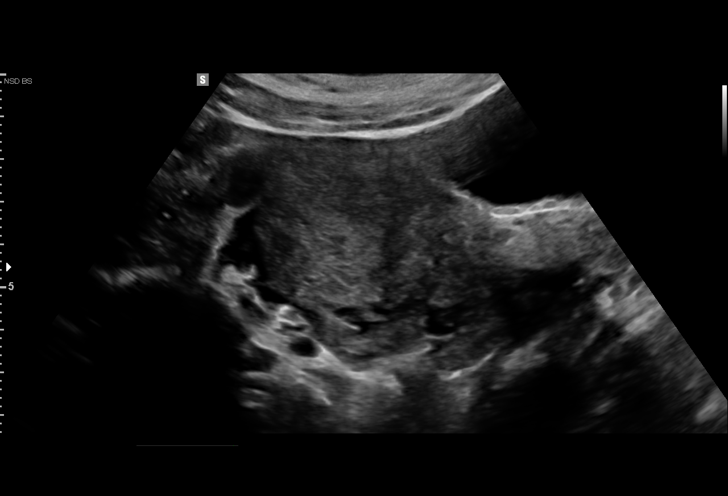
[im 6/36]
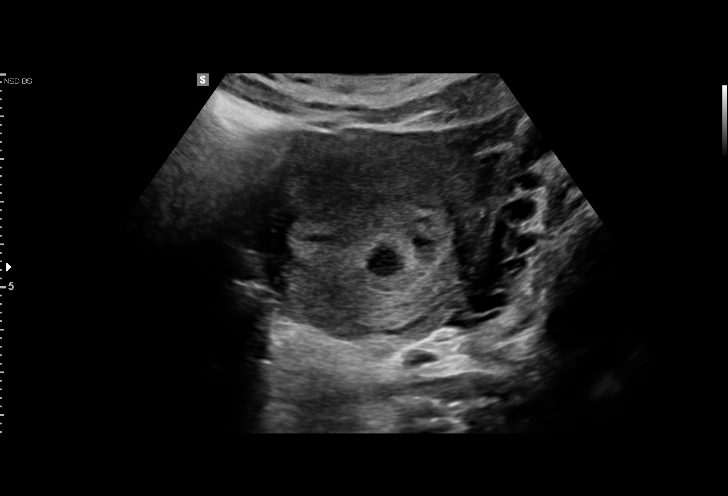
[im 8/36]
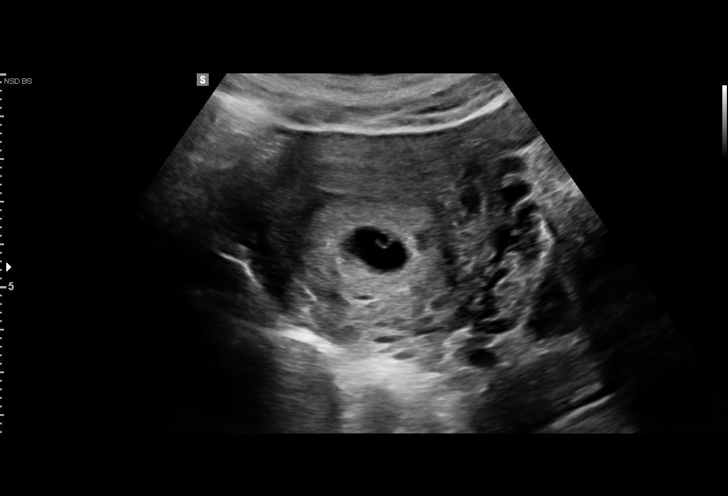
[im 11/36]
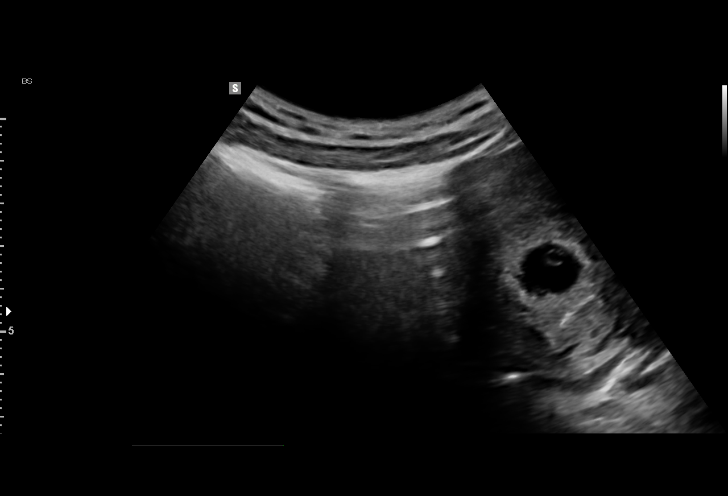
[im 13/36]
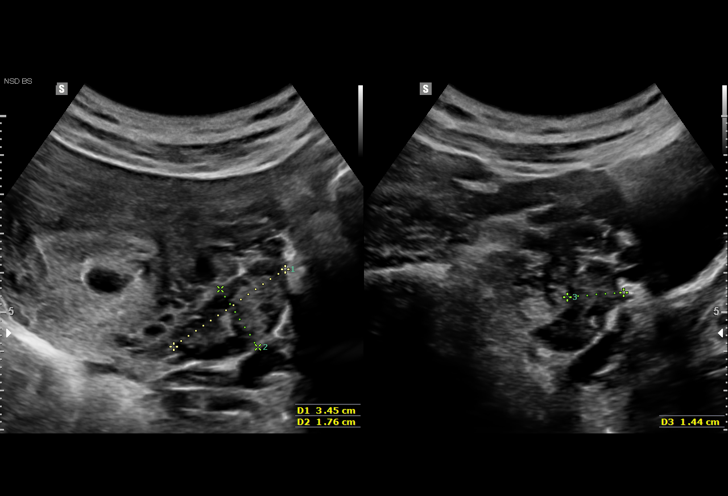
[im 16/36]
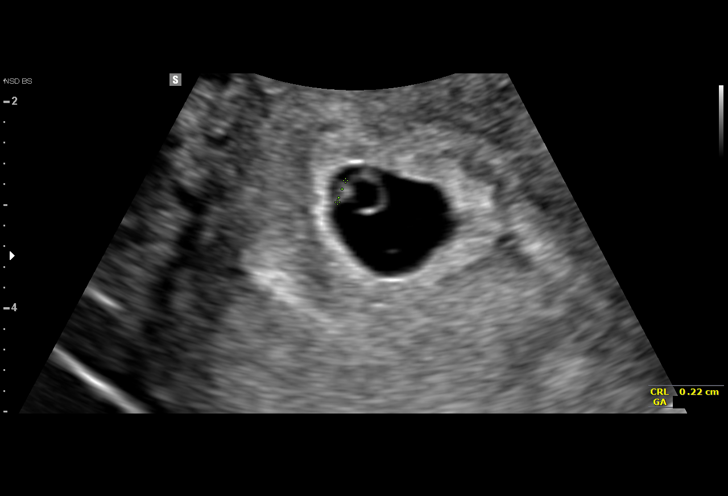
[im 19/36]
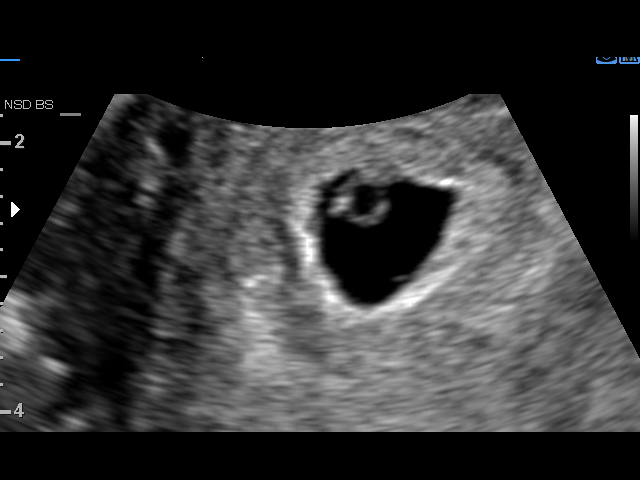
[im 20/36]
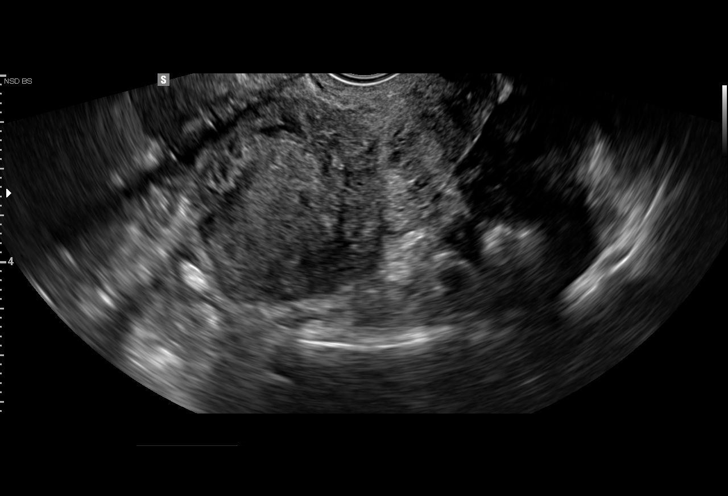
[im 23/36]
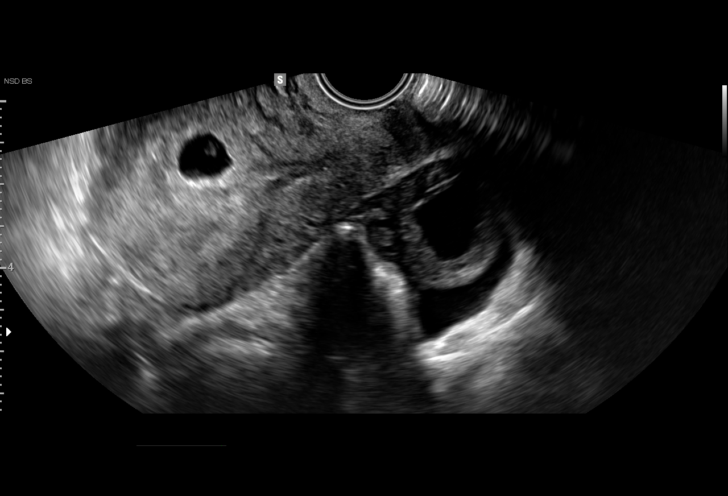
[im 25/36]
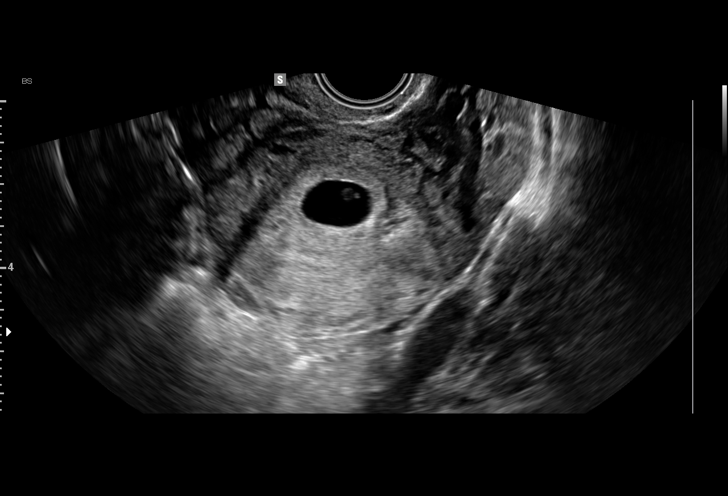
[im 28/36]
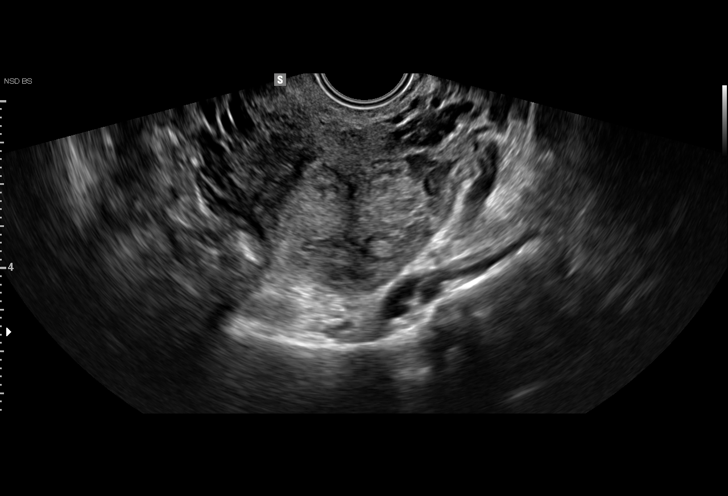
[im 30/36]
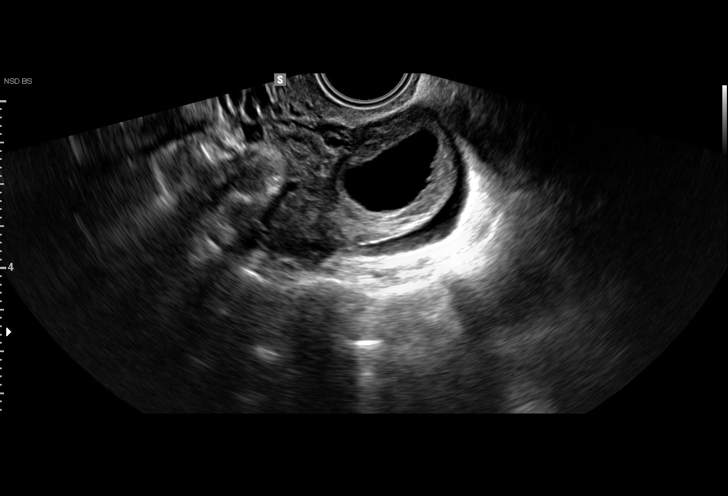
[im 33/36]
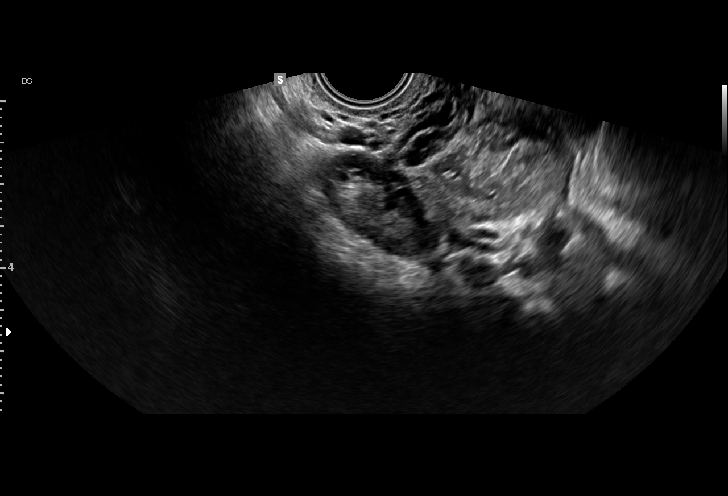
[im 36/36]
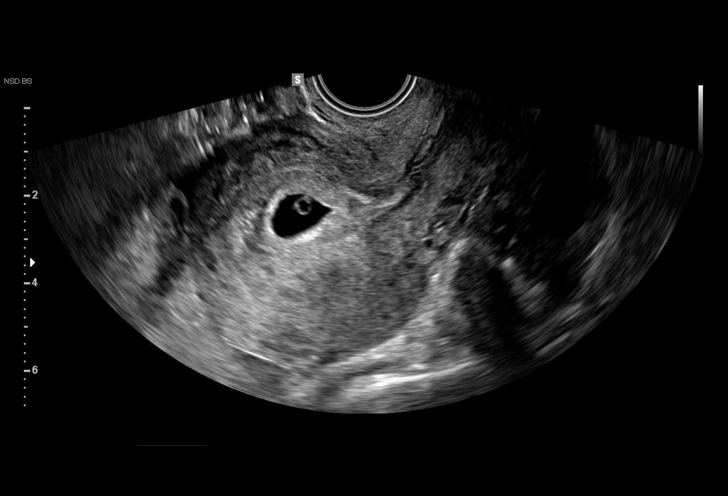

[15 of 28 positions shown; findings below may reference images not displayed]

FINDINGS: Intrauterine gestational sac: Single intrauterine gestational sac
appears normal in size, shape and position.

Yolk sac:  Present.

Embryo:  Present.

Embryonic Cardiac Activity: Present on cine sequence. Unable to
obtain M-mode due to small size of the embryo.

CRL:  2.2  mm   5 w   5 d                  US EDC: 08/09/2016

Subchorionic hemorrhage:  None visualized.

Maternal uterus/adnexae: Anteverted uterus with no uterine fibroids
demonstrated. Right ovary measures 5.0 x 2.9 x 3.0 cm and contains a
3.0 cm corpus luteum. Left ovary measures 4.1 x 1.9 x 2.2 cm. No
abnormal ovarian or adnexal masses. Small volume simple free fluid
in the right adnexa.
IMPRESSION: 1. Single living intrauterine gestation at 5 weeks 5 days by
crown-rump length, concordant with provided menstrual dating.
2. Embryonic cardiac activity is present. Embryonic heart rate could
not be obtained due to small size of the embryo.
3. No abnormal ovarian or adnexal masses.
4. Small volume simple free fluid in the right adnexa.

## 2018-08-11 ENCOUNTER — Emergency Department (HOSPITAL_COMMUNITY)
Admission: EM | Admit: 2018-08-11 | Discharge: 2018-08-11 | Disposition: A | Discharge status: Home / Self Care | Payer: BLUE CROSS/BLUE SHIELD | Attending: Emergency Medicine | Admitting: Emergency Medicine

## 2018-08-11 ENCOUNTER — Encounter (HOSPITAL_COMMUNITY): Payer: Self-pay | Admitting: *Deleted

## 2018-08-11 ENCOUNTER — Other Ambulatory Visit: Payer: Self-pay

## 2018-08-11 DIAGNOSIS — F17210 Nicotine dependence, cigarettes, uncomplicated: Secondary | ICD-10-CM | POA: Insufficient documentation

## 2018-08-11 DIAGNOSIS — J101 Influenza due to other identified influenza virus with other respiratory manifestations: Secondary | ICD-10-CM | POA: Insufficient documentation

## 2018-08-11 DIAGNOSIS — R05 Cough: Secondary | ICD-10-CM | POA: Diagnosis present

## 2018-08-11 DIAGNOSIS — J45909 Unspecified asthma, uncomplicated: Secondary | ICD-10-CM | POA: Diagnosis not present

## 2018-08-11 LAB — INFLUENZA PANEL BY PCR (TYPE A & B)
INFLAPCR: POSITIVE — AB
INFLBPCR: NEGATIVE

## 2018-08-11 MED ORDER — OSELTAMIVIR PHOSPHATE 75 MG PO CAPS: 75.0000 mg | ORAL_CAPSULE | Freq: Two times a day (BID) | ORAL | 0 refills | Status: DC

## 2018-08-11 MED ORDER — PROCHLORPERAZINE MALEATE 10 MG PO TABS
10.0000 mg | ORAL_TABLET | Freq: Once | ORAL | Status: AC
  Administered 2018-08-11: 10 mg via ORAL
  Filled 2018-08-11: qty 1

## 2018-08-11 MED ORDER — ACETAMINOPHEN 500 MG PO TABS
1000.0000 mg | ORAL_TABLET | Freq: Once | ORAL | Status: AC
  Administered 2018-08-11: 1000 mg via ORAL
  Filled 2018-08-11: qty 2

## 2018-08-11 NOTE — ED Triage Notes (Signed)
Pt reports chills, headache, and cough that started last night.  Pt states she had one episode of emesis last night.  Pt states that people at work have been sick.

## 2018-08-11 NOTE — Discharge Instructions (Addendum)
You were evaluated in the Emergency Department and after careful evaluation, we did not find any emergent condition requiring admission or further testing in the hospital.  Your flu test was positive.  Please take the flu medication, called Tamiflu, as directed.  If your children begin having symptoms, you can call your pediatrician to see if they need the medication as well.  Please return to the Emergency Department if you experience any worsening of your condition.  We encourage you to follow up with a primary care provider.  Thank you for allowing [place] to be a part of your care.

## 2018-08-11 NOTE — ED Provider Notes (Addendum)
[facility] Emergency Department Provider Note MRN:  [id number]  Arrival date & time: 08/11/18     Chief Complaint   Cough and URI   History of Present Illness   Lori Lloyd is a 23 y.o. year-old female with a history of asthma presenting to the ED with chief complaint of cough.  Patient began feeling ill last night.  Mild sore throat, mild dull frontal headache, mild nasal congestion, dry cough.  Denies body aches, no chest pain or shortness of breath, abdominal pain, no dysuria.  Multiple coworkers are also sick.  Review of Systems  A complete 10 system review of systems was obtained and all systems are negative except as noted in the HPI and PMH.   Patient's Health History    Past Medical History:  Diagnosis Date  . Asthma   . Depression   . Eczema   . Fracture    ring finger right hand    Past Surgical History:  Procedure Laterality Date  . CESAREAN SECTION N/A 06/26/2016   Procedure: CESAREAN SECTION;  Surgeon: Lesly Dukes, MD;  Location: [facility] BIRTHING SUITES;  Service: Obstetrics;  Laterality: N/A;  . INDUCED ABORTION    . WISDOM TOOTH EXTRACTION      Family History  Problem Relation Age of Onset  . Diabetes Maternal Grandmother   . Hypertension Maternal Grandmother   . Hearing loss Neg Hx     Social History   Socioeconomic History  . Marital status: Single    Spouse name: Not on file  . Number of children: Not on file  . Years of education: Not on file  . Highest education level: Not on file  Occupational History  . Not on file  Social Needs  . Financial resource strain: Not on file  . Food insecurity:    Worry: Not on file    Inability: Not on file  . Transportation needs:    Medical: Not on file    Non-medical: Not on file  Tobacco Use  . Smoking status: Current Every Day Smoker    Packs/day: 0.25    Years: 0.50    Pack years: 0.12    Types: Cigarettes  . Smokeless tobacco: Never Used  . Tobacco comment: with preg    Substance and Sexual Activity  . Alcohol use: No  . Drug use: No    Types: Marijuana    Comment: prior to preg  . Sexual activity: Yes    Birth control/protection: None    Comment: two weeks ago  Lifestyle  . Physical activity:    Days per week: Not on file    Minutes per session: Not on file  . Stress: Not on file  Relationships  . Social connections:    Talks on phone: Not on file    Gets together: Not on file    Attends religious service: Not on file    Active member of club or organization: Not on file    Attends meetings of clubs or organizations: Not on file    Relationship status: Not on file  . Intimate partner violence:    Fear of current or ex partner: Not on file    Emotionally abused: Not on file    Physically abused: Not on file    Forced sexual activity: Not on file  Other Topics Concern  . Not on file  Social History Narrative  . Not on file     Physical Exam  Vital Signs and  Nursing Notes reviewed Vitals:   08/11/18 0810  BP: 96/81  Pulse: 81  Resp: 20  Temp: 98.3 F (36.8 C)  SpO2: 96%    CONSTITUTIONAL: Well-appearing, NAD NEURO:  Alert and oriented x 3, no focal deficits, no meningismus EYES:  eyes equal and reactive ENT/NECK:  no LAD, no JVD CARDIO: Regular rate, well-perfused, normal S1 and S2 PULM:  CTAB no wheezing or rhonchi GI/GU:  normal bowel sounds, non-distended, non-tender MSK/SPINE:  No gross deformities, no edema SKIN:  no rash, atraumatic PSYCH:  Appropriate speech and behavior  Diagnostic and Interventional Summary    Labs Reviewed  INFLUENZA PANEL BY PCR (TYPE A & B) - Abnormal; Notable for the following components:      Result Value   Influenza A By PCR POSITIVE (*)    All other components within normal limits    No orders to display    Medications  acetaminophen (TYLENOL) tablet 1,000 mg (1,000 mg Oral Given 08/11/18 0848)  prochlorperazine (COMPAZINE) tablet 10 mg (10 mg Oral Given 08/11/18 8921)      Procedures Critical Care  ED Course and Medical Decision Making  I have reviewed the triage vital signs and the nursing notes.  Pertinent labs & imaging results that were available during my care of the patient were reviewed by me and considered in my medical decision making (see below for details).  Suspect viral illness, possibly influenza, will swab here.  Appropriate for discharge.  Patient explains that there is no chance that she is pregnant and defers pregnancy testing today.  Positive for influenza A, will treat with Tamiflu.  Patient advised to follow-up with her children's pediatrician if they begin exhibiting symptoms, to determine if they need the medication as well.  Elmer Sow. Pilar Plate, MD [facility] Emergency Medicine [facility] [email or web address]  Final Clinical Impressions(s) / ED Diagnoses     ICD-10-CM   1. Influenza A J10.1     ED Discharge Orders         Ordered    oseltamivir (TAMIFLU) 75 MG capsule  Every 12 hours     08/11/18 0937             Sabas Sous, MD 08/11/18 0940    Sabas Sous, MD 08/11/18 [phone]

## 2019-07-26 ENCOUNTER — Other Ambulatory Visit: Payer: Self-pay | Admitting: Family Medicine

## 2019-07-26 DIAGNOSIS — N644 Mastodynia: Secondary | ICD-10-CM

## 2019-07-26 DIAGNOSIS — N649 Disorder of breast, unspecified: Secondary | ICD-10-CM

## 2019-07-27 ENCOUNTER — Other Ambulatory Visit: Payer: Self-pay | Admitting: Family Medicine

## 2019-07-27 ENCOUNTER — Ambulatory Visit
Admission: RE | Admit: 2019-07-27 | Discharge: 2019-07-27 | Disposition: A | Discharge status: Home / Self Care | Payer: BC Managed Care – PPO | Source: Ambulatory Visit | Attending: Family Medicine | Admitting: Family Medicine

## 2019-07-27 ENCOUNTER — Other Ambulatory Visit: Payer: Self-pay

## 2019-07-27 DIAGNOSIS — N644 Mastodynia: Secondary | ICD-10-CM

## 2019-07-27 DIAGNOSIS — N649 Disorder of breast, unspecified: Secondary | ICD-10-CM

## 2021-02-07 ENCOUNTER — Other Ambulatory Visit: Payer: Self-pay

## 2021-02-07 ENCOUNTER — Emergency Department (HOSPITAL_BASED_OUTPATIENT_CLINIC_OR_DEPARTMENT_OTHER)
Admission: EM | Admit: 2021-02-07 | Discharge: 2021-02-08 | Disposition: A | Discharge status: Home / Self Care | Payer: Self-pay | Attending: Emergency Medicine | Admitting: Emergency Medicine

## 2021-02-07 ENCOUNTER — Encounter (HOSPITAL_BASED_OUTPATIENT_CLINIC_OR_DEPARTMENT_OTHER): Payer: Self-pay

## 2021-02-07 DIAGNOSIS — Z87891 Personal history of nicotine dependence: Secondary | ICD-10-CM | POA: Insufficient documentation

## 2021-02-07 DIAGNOSIS — J4531 Mild persistent asthma with (acute) exacerbation: Secondary | ICD-10-CM

## 2021-02-07 DIAGNOSIS — J4541 Moderate persistent asthma with (acute) exacerbation: Secondary | ICD-10-CM | POA: Insufficient documentation

## 2021-02-07 DIAGNOSIS — Z20822 Contact with and (suspected) exposure to covid-19: Secondary | ICD-10-CM | POA: Insufficient documentation

## 2021-02-07 DIAGNOSIS — J1089 Influenza due to other identified influenza virus with other manifestations: Secondary | ICD-10-CM | POA: Insufficient documentation

## 2021-02-07 NOTE — ED Triage Notes (Addendum)
Pt is present for a productive cough with chest tightness due to cough x two days. Pt has not used inhaler recently for asthma. Pt is requesting a breathing tx or CXR. Pt drank theraflu and felt slightly better but sx shortly returned.

## 2021-02-08 ENCOUNTER — Emergency Department (HOSPITAL_BASED_OUTPATIENT_CLINIC_OR_DEPARTMENT_OTHER): Payer: Self-pay

## 2021-02-08 LAB — RESP PANEL BY RT-PCR (FLU A&B, COVID) ARPGX2
Influenza A by PCR: NEGATIVE
Influenza B by PCR: NEGATIVE
SARS Coronavirus 2 by RT PCR: NEGATIVE

## 2021-02-08 MED ORDER — PREDNISONE 50 MG PO TABS
60.0000 mg | ORAL_TABLET | Freq: Once | ORAL | Status: AC
  Administered 2021-02-08: 60 mg via ORAL
  Filled 2021-02-08: qty 1

## 2021-02-08 MED ORDER — IPRATROPIUM BROMIDE 0.02 % IN SOLN
0.5000 mg | Freq: Once | RESPIRATORY_TRACT | Status: AC
  Administered 2021-02-08: 0.5 mg via RESPIRATORY_TRACT
  Filled 2021-02-08: qty 2.5

## 2021-02-08 MED ORDER — ALBUTEROL SULFATE (2.5 MG/3ML) 0.083% IN NEBU
5.0000 mg | INHALATION_SOLUTION | Freq: Once | RESPIRATORY_TRACT | Status: AC
  Administered 2021-02-08: 5 mg via RESPIRATORY_TRACT
  Filled 2021-02-08: qty 6

## 2021-02-08 MED ORDER — ALBUTEROL SULFATE HFA 108 (90 BASE) MCG/ACT IN AERS
2.0000 | INHALATION_SPRAY | Freq: Once | RESPIRATORY_TRACT | Status: AC
  Administered 2021-02-08: 2 via RESPIRATORY_TRACT
  Filled 2021-02-08: qty 6.7

## 2021-02-08 MED ORDER — PREDNISONE 50 MG PO TABS: ORAL_TABLET | ORAL | 0 refills | Status: DC

## 2021-02-08 NOTE — ED Provider Notes (Signed)
[facility] EMERGENCY DEPT Provider Note   CSN: [id number] Arrival date & time: 02/07/21  2340     History Chief Complaint  Patient presents with   Cough   Chest Pain    Lori Lloyd is a 25 y.o. female.  The history is provided by the patient.  Cough Cough characteristics:  Non-productive Severity:  Moderate Onset quality:  Gradual Duration:  2 days Timing:  Intermittent Progression:  Worsening Chronicity:  New Smoker: yes   Relieved by:  Nothing Worsened by:  Nothing Associated symptoms: chest pain, shortness of breath and wheezing   Associated symptoms: no fever   Chest Pain Associated symptoms: cough and shortness of breath   Associated symptoms: no fever and no vomiting   Risk factors: smoking   Patient w/history of asthma presents with cough and wheezing.  She reports it started over 2 days ago.  Denies hemoptysis.  She reports chest soreness with coughing.  She has taken TheraFlu with minimal improvement.  No vomiting.  She thinks she needs an albuterol treatment.  She is in the process of moving & does not have her medications   She is unvaccinated for COVID Past Medical History:  Diagnosis Date   Asthma    Depression    Eczema    Fracture    ring finger right hand    Patient Active Problem List   Diagnosis Date Noted   Status post cesarean section 06/29/2016   Vaginal bleeding 06/25/2016   Low-lying placenta 06/15/2016   Placenta previa 06/09/2016   Depressive disorder 02/18/2013   Supervision of high-risk pregnancy 02/18/2013    Past Surgical History:  Procedure Laterality Date   CESAREAN SECTION N/A 06/26/2016   Procedure: CESAREAN SECTION;  Surgeon: Lesly Dukes, MD;  Location: [facility] BIRTHING SUITES;  Service: Obstetrics;  Laterality: N/A;   INDUCED ABORTION     WISDOM TOOTH EXTRACTION       OB History     Gravida  4   Para  2   Term  1   Preterm  1   AB  2   Living  2      SAB  0   IAB  2   Ectopic  0    Multiple  0   Live Births  2           Family History  Problem Relation Age of Onset   Diabetes Maternal Grandmother    Hypertension Maternal Grandmother    Hearing loss Neg Hx     Social History   Tobacco Use   Smoking status: Former    Packs/day: 0.25    Years: 0.50    Pack years: 0.13    Types: Cigarettes   Smokeless tobacco: Never   Tobacco comments:    with preg  Vaping Use   Vaping Use: Never used  Substance Use Topics   Alcohol use: No   Drug use: No    Types: Marijuana    Comment: prior to preg    Home Medications Prior to Admission medications   Medication Sig Start Date End Date Taking? Authorizing Provider  predniSONE (DELTASONE) 50 MG tablet 1 tablet PO QD X4 days 02/08/21  Yes Zadie Rhine, MD  ibuprofen (ADVIL,MOTRIN) 600 MG tablet Take 1 tablet (600 mg total) by mouth every 6 (six) hours as needed for mild pain. 06/29/16   Levie Heritage, DO  loratadine (CLARITIN) 10 MG tablet Take 1 tablet (10 mg total) by mouth daily. 09/30/16  Armando Reichert, CNM  Prenatal Vit-Fe Fumarate-FA (PRENATAL MULTIVITAMIN) TABS tablet Take 1 tablet by mouth daily at 12 noon. Patient not taking: Reported on 06/25/2016 06/09/16   Hermina Staggers, MD    Allergies    Shellfish allergy  Review of Systems   Review of Systems  Constitutional:  Negative for fever.  Respiratory:  Positive for cough, shortness of breath and wheezing.   Cardiovascular:  Positive for chest pain. Negative for leg swelling.  Gastrointestinal:  Negative for vomiting.  All other systems reviewed and are negative.  Physical Exam Updated Vital Signs BP 114/81 (BP Location: Right Arm)   Pulse 88   Temp 98.5 F (36.9 C) (Oral)   Resp 18   Ht 1.549 m (5\' 1" )   Wt 56.7 kg   LMP 02/07/2021   SpO2 100%   BMI 23.62 kg/m   Physical Exam CONSTITUTIONAL: Well developed/well nourished HEAD: Normocephalic/atraumatic EYES: EOMI/PERRL ENMT: Mucous membranes moist, mask in place NECK:  supple no meningeal signs SPINE/BACK:entire spine nontender CV: S1/S2 noted, no murmurs/rubs/gallops noted LUNGS: Wheezing bilaterally, mild tachypnea ABDOMEN: soft, nontender, no rebound or guarding, bowel sounds noted throughout abdomen GU:no cva tenderness NEURO: Pt is awake/alert/appropriate, moves all extremitiesx4.  No facial droop.   EXTREMITIES: pulses normal/equal, full ROM, no lower extremity edema or tenderness SKIN: warm, color normal PSYCH: no abnormalities of mood noted, alert and oriented to situation  ED Results / Procedures / Treatments   Labs (all labs ordered are listed, but only abnormal results are displayed) Labs Reviewed  RESP PANEL BY RT-PCR (FLU A&B, COVID) [id number]    EKG EKG Interpretation  Date/Time:  Sunday February 08 2021 00:22:23 EDT Ventricular Rate:  80 PR Interval:  136 QRS Duration: 87 QT Interval:  367 QTC Calculation: 424 R Axis:   78 Text Interpretation: Sinus rhythm Confirmed by 01-17-1992 (Zadie Rhine) on 02/08/2021 12:27:19 AM  Radiology DG Chest Portable 1 View  Result Date: 02/08/2021 CLINICAL DATA:  Dyspnea, productive cough EXAM: PORTABLE CHEST 1 VIEW COMPARISON:  10/07/2017 FINDINGS: The heart size and mediastinal contours are within normal limits. Both lungs are clear. The visualized skeletal structures are unremarkable. IMPRESSION: No active disease. Electronically Signed   By: 10/09/2017 M.D.   On: 02/08/2021 00:46    Procedures Procedures   Medications Ordered in ED Medications  albuterol (VENTOLIN HFA) 108 (90 Base) MCG/ACT inhaler 2 puff (has no administration in time range)  albuterol (PROVENTIL) (2.5 MG/3ML) 0.083% nebulizer solution 5 mg (5 mg Nebulization Given 02/08/21 0017)  ipratropium (ATROVENT) nebulizer solution 0.5 mg (0.5 mg Nebulization Given 02/08/21 0017)  predniSONE (DELTASONE) tablet 60 mg (60 mg Oral Given 02/08/21 0013)    ED Course  I have reviewed the triage vital signs and the nursing  notes.  Pertinent imaging results that were available during my care of the patient were reviewed by me and considered in my medical decision making (see chart for details).    MDM Rules/Calculators/A&P                           Patient presents with cough and wheezing.  She reports she has not had access to her meds due to recent move Patient is much improved after nebs. Plan to give albuterol discharge and start prednisone for 4 days. Final Clinical Impression(s) / ED Diagnoses Final diagnoses:  Mild persistent asthma with exacerbation    Rx / DC Orders ED Discharge Orders  Ordered    predniSONE (DELTASONE) 50 MG tablet        02/08/21 0056             Zadie Rhine, MD 02/08/21 0106

## 2021-11-25 ENCOUNTER — Encounter (HOSPITAL_BASED_OUTPATIENT_CLINIC_OR_DEPARTMENT_OTHER): Payer: Self-pay | Admitting: Obstetrics and Gynecology

## 2021-11-25 ENCOUNTER — Other Ambulatory Visit: Payer: Self-pay

## 2021-11-25 ENCOUNTER — Emergency Department (HOSPITAL_BASED_OUTPATIENT_CLINIC_OR_DEPARTMENT_OTHER): Payer: Self-pay

## 2021-11-25 ENCOUNTER — Emergency Department (HOSPITAL_BASED_OUTPATIENT_CLINIC_OR_DEPARTMENT_OTHER)
Admission: EM | Admit: 2021-11-25 | Discharge: 2021-11-25 | Disposition: A | Discharge status: Home / Self Care | Payer: Self-pay | Attending: Emergency Medicine | Admitting: Emergency Medicine

## 2021-11-25 DIAGNOSIS — M25511 Pain in right shoulder: Secondary | ICD-10-CM | POA: Diagnosis not present

## 2021-11-25 DIAGNOSIS — M545 Low back pain, unspecified: Secondary | ICD-10-CM | POA: Insufficient documentation

## 2021-11-25 DIAGNOSIS — M25561 Pain in right knee: Secondary | ICD-10-CM | POA: Insufficient documentation

## 2021-11-25 DIAGNOSIS — R269 Unspecified abnormalities of gait and mobility: Secondary | ICD-10-CM | POA: Diagnosis not present

## 2021-11-25 DIAGNOSIS — S0990XA Unspecified injury of head, initial encounter: Secondary | ICD-10-CM | POA: Diagnosis present

## 2021-11-25 DIAGNOSIS — S060X0A Concussion without loss of consciousness, initial encounter: Secondary | ICD-10-CM | POA: Diagnosis not present

## 2021-11-25 DIAGNOSIS — S060X0D Concussion without loss of consciousness, subsequent encounter: Secondary | ICD-10-CM

## 2021-11-25 MED ORDER — NAPROXEN 500 MG PO TABS: 500.0000 mg | ORAL_TABLET | Freq: Two times a day (BID) | ORAL | 0 refills | Status: DC

## 2021-11-25 MED ORDER — NAPROXEN 250 MG PO TABS
500.0000 mg | ORAL_TABLET | Freq: Once | ORAL | Status: AC
  Administered 2021-11-25: 500 mg via ORAL
  Filled 2021-11-25: qty 2

## 2021-11-25 MED ORDER — METHOCARBAMOL 500 MG PO TABS: 500.0000 mg | ORAL_TABLET | Freq: Every evening | ORAL | 0 refills | Status: AC
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
## 2021-11-25 NOTE — Discharge Instructions (Addendum)
You have been provided the contact information for Dr. Zachery Dakins, a local orthopedist.  Please call and schedule an appointment within the next 3 to 4 days for reevaluation continue medical management.  2 prescriptions been called in your pharmacy, they are as follows: Naproxen-an anti-inflammatory.  You may take 1 tablet every 12 hours as needed for pain relief.  Always take with plenty of food and water.  Do not take ibuprofen or Motrin at the same time, as these are in the same medication class.  You may though take Tylenol in addition to the naproxen. Robaxin-a muscle relaxant.  You may take 1 tablet before bed each night for additional pain relief.  Do not drive or operate machinery after using this medication, as this is a side effect of drowsiness.  If the drowsiness is more than you feel you can tolerate, you may stop taking this medication at any time.  You may also utilize over-the-counter lidocaine/Lidoderm patches.  Please use as instructed.  Continue to rest and ice the affected areas as well.  If you still find you are having continued headaches, grogginess, and fatigue for more than 1 month, please follow-up with neurology for evaluation for postconcussive syndrome.  Return to the ED for new or worsening symptoms as discussed.   If you do not have a doctor see the list below.  RESOURCE GUIDE  Chronic Pain Problems: Contact [facility]  [phone] Patients need to be referred by their primary care doctor.  Insufficient Money for Medicine: Contact [employer]:  call "211" or [place] [phone].  No Primary Care Doctor: Call [facility]  [phone] - can help you locate a primary care doctor that  accepts your insurance, provides certain services, etc. Physician Referral Service- [phone] Agencies that provide inexpensive medical care: [facility]  [facility]  [phone] [facility]  [phone] [facility]  [phone] Planned Parenthood  [phone] [facility]  [phone]  [place] Providers: [facility]- [address], [address]  [phone], Mon-Fri 9am-7pm, Sat 9am-1pm [place], [address] [place]  [place], [address] [place]  [place]- [address]  [place], [address], [phone]  Only accepts [place] patients after they have their name  applied to their card  Self Pay (no insurance) in [facility]: Sickle Cell Patients: Dr Kevan Ny, [facility]  [place], [facility]- [place]  [place] [facility] [place]- [phone] [place], [facility]- see information above (Speak to [employer] if you do not have insurance)       -  [facility]- [place], [place],  [address], [phone]       -  Dr Vista Lawman-  [address], [place], [facility]- [address] [phone]       -  [place], [place], also [address] [phone]       -    [facility]- [address], [place], 1st & 3rd Saturday   every month, 10am-1pm  1) Find a Doctor and Pay Out of Pocket Although you won't have to find out who is covered by your insurance plan, it is a good idea to ask around and get recommendations. You will then need to call the office and see if the doctor you have chosen will accept you as a new patient and what types of options they offer for patients who are self-pay. Some doctors offer discounts or will set up payment plans  for their patients who do not have insurance, but you will need to ask so you aren't surprised when you get to your appointment.  2) Contact Your Local Health Department Not all health departments have doctors that can see patients for sick visits, but many do, so it is worth a call to see if yours does. If you don't know where your local health department is, you can check in your phone book. The CDC also has a tool to help you locate your state's health department, and many state websites also have listings of all of their local health departments.  3) Find a [facility] If your illness is not likely to be very severe or complicated, you may want to try a walk in clinic. These are popping up all over the country in pharmacies, drugstores, and shopping centers. They're usually staffed by nurse practitioners or physician assistants that have been trained to treat common illnesses and complaints. They're usually fairly quick and inexpensive. However, if you have serious medical issues or chronic medical problems, these are probably not your best option  [facility] [place], [facility], [address], [place], phone [phone] or [phone].  Monday - Friday, call for an appointment. [place], [facility], [address], [place], phone [phone] or [phone].  Monday - Friday, call for an appointment.  Abuse/Neglect: [place] [phone] [place] [phone] (After Hours)  Emergency Shelter:  [facility] [phone]  Maternity Homes: Room at the [place] [phone] [place] [phone]  MRSA Hotline #:   435 488 4910  [facility] Dept. [address]      [place] Phone:  [phone]  Phone:  [phone]                 Phone:  [place], [place]- [phone]       -     [facility] in [place], [address],                                  [place] [phone] or [phone] (After Hours)  [place]  Substance Abuse Resources: Alcohol and Drug Services  [phone] [place] [phone] [facility] [place] Chinita Pester [phone] Residential & Outpatient Substance Abuse Program  2297425315  Psychological Services: [place]  [phone] [place]  [place], [place]. [address] [place], [place]: [phone] or [phone], PicCapture.uy  Dental Assistance  Patients with Medicaid: [place] [phone] [address] Phone:  [phone]                                                  Phone:  [phone]  If unable to pay or uninsured, contact:  [facility] or [facility]. to become qualified for the adult dental clinic.  Patients with Medicaid: [facility] [phone] [address], [place] [address], [phone]  If unable to pay, or uninsured, contact [facility] [phone]) or [place] [phone] in [place], [place] in [facility]) to become qualified for the adult dental clinic  Other [place]- [place], [place], [place], [address], [place], [place], 2nd and 4th Thursday of the month at 6:30am.  10 clients each day by appointment, can sometimes see walk-in patients if someone does not show for an appointment. [facility]- [address] [place]ard Danker [place], [place], [address], [place], [place], [place], [address], [place] Department- [phone] [place] [facility] [phone]

## 2023-03-22 ENCOUNTER — Emergency Department (HOSPITAL_COMMUNITY): Payer: Medicaid Other

## 2023-03-22 ENCOUNTER — Encounter (HOSPITAL_COMMUNITY): Payer: Self-pay

## 2023-03-22 ENCOUNTER — Emergency Department (HOSPITAL_COMMUNITY)
Admission: EM | Admit: 2023-03-22 | Discharge: 2023-03-22 | Disposition: A | Discharge status: Home / Self Care | Payer: Medicaid Other | Attending: Emergency Medicine | Admitting: Emergency Medicine

## 2023-03-22 ENCOUNTER — Other Ambulatory Visit: Payer: Self-pay

## 2023-03-22 DIAGNOSIS — O99511 Diseases of the respiratory system complicating pregnancy, first trimester: Secondary | ICD-10-CM | POA: Diagnosis not present

## 2023-03-22 DIAGNOSIS — Z3491 Encounter for supervision of normal pregnancy, unspecified, first trimester: Secondary | ICD-10-CM

## 2023-03-22 DIAGNOSIS — O219 Vomiting of pregnancy, unspecified: Secondary | ICD-10-CM | POA: Insufficient documentation

## 2023-03-22 DIAGNOSIS — Z1152 Encounter for screening for COVID-19: Secondary | ICD-10-CM | POA: Diagnosis not present

## 2023-03-22 DIAGNOSIS — J069 Acute upper respiratory infection, unspecified: Secondary | ICD-10-CM | POA: Insufficient documentation

## 2023-03-22 DIAGNOSIS — J45909 Unspecified asthma, uncomplicated: Secondary | ICD-10-CM | POA: Insufficient documentation

## 2023-03-22 DIAGNOSIS — R110 Nausea: Secondary | ICD-10-CM

## 2023-03-22 LAB — COMPREHENSIVE METABOLIC PANEL
ALT: 23 U/L (ref 0–44)
AST: 25 U/L (ref 15–41)
Albumin: 4.3 g/dL (ref 3.5–5.0)
Alkaline Phosphatase: 45 U/L (ref 38–126)
Anion gap: 9 (ref 5–15)
BUN: 7 mg/dL (ref 6–20)
CO2: 18 mmol/L — ABNORMAL LOW (ref 22–32)
Calcium: 9 mg/dL (ref 8.9–10.3)
Chloride: 107 mmol/L (ref 98–111)
Creatinine, Ser: 0.78 mg/dL (ref 0.44–1.00)
GFR, Estimated: >60 mL/min (ref >60)
Glucose, Bld: 89 mg/dL (ref 70–99)
Potassium: 3.3 mmol/L — ABNORMAL LOW (ref 3.5–5.1)
Sodium: 134 mmol/L — ABNORMAL LOW (ref 135–145)
Total Bilirubin: 0.8 mg/dL (ref 0.3–1.2)
Total Protein: 7.4 g/dL (ref 6.5–8.1)

## 2023-03-22 LAB — URINALYSIS, ROUTINE W REFLEX MICROSCOPIC
Bilirubin Urine: NEGATIVE
Glucose, UA: NEGATIVE mg/dL
Hgb urine dipstick: NEGATIVE
Ketones, ur: 5 mg/dL — AB
Leukocytes,Ua: NEGATIVE
Nitrite: NEGATIVE
Protein, ur: NEGATIVE mg/dL
Specific Gravity, Urine: 1.012 (ref 1.005–1.030)
pH: 5 (ref 5.0–8.0)

## 2023-03-22 LAB — CBC WITH DIFFERENTIAL/PLATELET
Abs Immature Granulocytes: 0.01 10*3/uL (ref 0.00–0.07)
Basophils Absolute: 0 10*3/uL (ref 0.0–0.1)
Basophils Relative: 0 %
Eosinophils Absolute: 0.1 10*3/uL (ref 0.0–0.5)
Eosinophils Relative: 1 %
HCT: 34.6 % — ABNORMAL LOW (ref 36.0–46.0)
Hemoglobin: 11.3 g/dL — ABNORMAL LOW (ref 12.0–15.0)
Immature Granulocytes: 0 %
Lymphocytes Relative: 48 %
Lymphs Abs: 3.2 10*3/uL (ref 0.7–4.0)
MCH: 30.4 pg (ref 26.0–34.0)
MCHC: 32.7 g/dL (ref 30.0–36.0)
MCV: 93 fL (ref 80.0–100.0)
Monocytes Absolute: 0.5 10*3/uL (ref 0.1–1.0)
Monocytes Relative: 7 %
Neutro Abs: 2.9 10*3/uL (ref 1.7–7.7)
Neutrophils Relative %: 44 %
Platelets: 290 10*3/uL (ref 150–400)
RBC: 3.72 MIL/uL — ABNORMAL LOW (ref 3.87–5.11)
RDW: 12.4 % (ref 11.5–15.5)
WBC: 6.7 10*3/uL (ref 4.0–10.5)
nRBC: 0 % (ref 0.0–0.2)

## 2023-03-22 LAB — HCG, QUANTITATIVE, PREGNANCY: hCG, Beta Chain, Quant, S: 10311 m[IU]/mL — ABNORMAL HIGH (ref <5)

## 2023-03-22 LAB — PREGNANCY, URINE: Preg Test, Ur: POSITIVE — AB

## 2023-03-22 LAB — SARS CORONAVIRUS 2 BY RT PCR: SARS Coronavirus 2 by RT PCR: NEGATIVE

## 2023-03-22 LAB — LIPASE, BLOOD: Lipase: 36 U/L (ref 11–51)

## 2023-03-22 MED ORDER — ONDANSETRON 4 MG PO TBDP
8.0000 mg | ORAL_TABLET | Freq: Once | ORAL | Status: AC
  Administered 2023-03-22: 8 mg via ORAL
  Filled 2023-03-22: qty 2

## 2023-03-22 MED ORDER — ONDANSETRON HCL 4 MG PO TABS: 4.0000 mg | ORAL_TABLET | Freq: Four times a day (QID) | ORAL | 0 refills | Status: DC

## 2023-03-22 NOTE — Discharge Instructions (Addendum)
You were seen in the ER for nausea and ongoing cough.  As we discussed, your pregnancy test was positive. Your blood test shows you are likely at least [redacted] weeks pregnant. The rest of your tests were normal.   I suspect you likely had a viral upper respiratory infection and that will take time to improve. I recommend starting a prenatal vitamin and I have sent a prescription for nausea medication.  Please follow up with the OBGYN for further evaluation of your early pregnancy. Continue to monitor how you're doing and return to the ER for new or worsening symptoms such as worsening pain/cramping or vaginal bleeding.

## 2023-03-22 NOTE — ED Provider Notes (Signed)
Lori Lloyd Provider Note   CSN: [id number] Arrival date & time: 03/22/23  1214     History  Chief Complaint  Patient presents with   Nausea    Lori Lloyd is a 27 y.o. female with history of asthma, eczema, depression, who presents to the ER complaining of cough, nausea, vomiting, and abdominal cramping. Cough started 2 weeks ago, originally her children were sick with something similar. She has continued productive cough and post tussive emesis x 4-5 days. Feeling nauseous. Having some right lower abdominal cramping that feels like menstrual cramps, but not currently on menstrual cycle.   HPI     Home Medications Prior to Admission medications   Medication Sig Start Date End Date Taking? Authorizing Provider  ondansetron (ZOFRAN) 4 MG tablet Take 1 tablet (4 mg total) by mouth every 6 (six) hours. 03/22/23  Yes Rosangelica Pevehouse T, PA-C  loratadine (CLARITIN) 10 MG tablet Take 1 tablet (10 mg total) by mouth daily. 09/30/16   Armando Reichert, CNM  naproxen (NAPROSYN) 500 MG tablet Take 1 tablet (500 mg total) by mouth 2 (two) times daily. 11/25/21   Cecil Cobbs, PA-C  predniSONE (DELTASONE) 50 MG tablet 1 tablet PO QD X4 days 02/08/21   Zadie Rhine, MD  Prenatal Vit-Fe Fumarate-FA (PRENATAL MULTIVITAMIN) TABS tablet Take 1 tablet by mouth daily at 12 noon. Patient not taking: Reported on 06/25/2016 06/09/16   Hermina Staggers, MD      Allergies    Shellfish allergy    Review of Systems   Review of Systems  Respiratory:  Positive for cough.   Gastrointestinal:  Positive for abdominal pain and nausea.  All other systems reviewed and are negative.   Physical Exam Updated Vital Signs BP (!) 106/55 (BP Location: Right Arm)   Pulse 73   Temp 98.3 F (36.8 C) (Oral)   Resp 17   LMP 02/16/2023 (Exact Date)   SpO2 100%  Physical Exam Vitals and nursing note reviewed.  Constitutional:      Appearance: Normal  appearance.  HENT:     Head: Normocephalic and atraumatic.  Eyes:     Conjunctiva/sclera: Conjunctivae normal.  Cardiovascular:     Rate and Rhythm: Normal rate and regular rhythm.  Pulmonary:     Effort: Pulmonary effort is normal. No respiratory distress.     Breath sounds: Normal breath sounds.  Abdominal:     General: There is no distension.     Palpations: Abdomen is soft.     Tenderness: There is no abdominal tenderness.  Skin:    General: Skin is warm and dry.  Neurological:     General: No focal deficit present.     Mental Status: She is alert.     ED Results / Procedures / Treatments   Labs (all labs ordered are listed, but only abnormal results are displayed) Labs Reviewed  COMPREHENSIVE METABOLIC PANEL - Abnormal; Notable for the following components:      Result Value   Sodium 134 (*)    Potassium 3.3 (*)    CO2 18 (*)    All other components within normal limits  CBC WITH DIFFERENTIAL/PLATELET - Abnormal; Notable for the following components:   RBC 3.72 (*)    Hemoglobin 11.3 (*)    HCT 34.6 (*)    All other components within normal limits  URINALYSIS, ROUTINE W REFLEX MICROSCOPIC - Abnormal; Notable for the following components:   APPearance HAZY (*)  Ketones, ur 5 (*)    All other components within normal limits  PREGNANCY, URINE - Abnormal; Notable for the following components:   Preg Test, Ur POSITIVE (*)    All other components within normal limits  HCG, QUANTITATIVE, PREGNANCY - Abnormal; Notable for the following components:   hCG, Beta Chain, Quant, S 10,311 (*)    All other components within normal limits  SARS CORONAVIRUS 2 BY RT PCR  LIPASE, BLOOD    EKG None  Radiology DG Chest 2 View  Result Date: 03/22/2023 CLINICAL DATA:  Two-week history of cough, nausea, and vomiting EXAM: CHEST - 2 VIEW COMPARISON:  Chest radiograph dated 02/08/2021 FINDINGS: Normal lung volumes. No focal consolidations. No pleural effusion or pneumothorax. The  heart size and mediastinal contours are within normal limits. No acute osseous abnormality. IMPRESSION: No active cardiopulmonary disease. Electronically Signed   By: Agustin Cree M.D.   On: 03/22/2023 14:15    Procedures Procedures    Medications Ordered in ED Medications  ondansetron (ZOFRAN-ODT) disintegrating tablet 8 mg (8 mg Oral Given 03/22/23 1237)    ED Course/ Medical Decision Making/ A&P                                 Medical Decision Making Risk Prescription drug management.   This patient is a 27 y.o. female  who presents to the ED for concern of cough, N/V, abdominal cramping.   Differential diagnoses prior to evaluation: The emergent differential diagnosis includes, but is not limited to,  Upper respiratory infection, lower respiratory infection, allergies, asthma, irritants, reflux, postnasal drip, sepsis, Drug-related (toxicity, THC hyperemesis, ETOH, withdrawal), Electrolyte abnormalities, Gastroenteritis, Gastroparesis, UTI, Ovarian torsion, Pregnancy, Hyperemesis gravidarum. This is not an exhaustive differential.   Past Medical History / Co-morbidities / Social History: asthma, eczema, depression  Additional history: Chart reviewed. Pertinent results include: pt reports hx of high risk pregnancy due to placenta previa with last pregnancy  Physical Exam: Physical exam performed. The pertinent findings include: Normal vital signs, no acute distress. Lung sounds clear. Abdomen soft and nontender.   Lab Tests/Imaging studies: I personally interpreted labs/imaging and the pertinent results include:  no leukocytosis,stable hemoglobin. Potassium 3.3, otherwise CMP unremarkable. Urine pregnancy positive. Urinalysis negative for hematuria or infection. COVID negative. Beta hcg 10,311.  CXR without acute cardiopulmonary abnormalities. I agree with the radiologist interpretation.  Medications: Given zofran in triage. On my evaluation, patient is doing much better, no  nausea at present. I have reviewed the patients home medicines and have made adjustments as needed.   Disposition: After consideration of the diagnostic results and the patients response to treatment, I feel that emergency department workup does not suggest an emergent condition requiring admission or immediate intervention beyond what has been performed at this time. The plan is: discharge to home with symptomatic management of nausea likely in the setting of first trimester pregnancy. Clinically well appearing and stable, no significant abdominal pain or bleeding to increased my suspicion for ruptured ectopic or other emergent OB complication. Will recommend starting prenatal, follow up with OBGYN. Prescribed zofran. Recommended continued symptomatic management of cough likely due to viral URI. The patient is safe for discharge and has been instructed to return immediately for worsening symptoms, change in symptoms or any other concerns.  Final Clinical Impression(s) / ED Diagnoses Final diagnoses:  Nausea  First trimester pregnancy  Viral URI with cough    Rx / DC  Orders ED Discharge Orders          Ordered    ondansetron (ZOFRAN) 4 MG tablet  Every 6 hours        03/22/23 1625           Portions of this report may have been transcribed using voice recognition software. Every effort was made to ensure accuracy; however, inadvertent computerized transcription errors may be present.    Jeanella Flattery 03/22/23 1633    Glyn Ade, MD 03/23/23 1507

## 2023-03-22 NOTE — ED Provider Triage Note (Signed)
Emergency Medicine Provider Triage Evaluation Note  Lori Lloyd , a 27 y.o. female  was evaluated in triage.  Pt complains of cough, nausea, vomiting, abdominal cramping.  Patient states he has been with cough for the past 2 weeks.  Originally, was sick with her children with similar symptoms.  Reports continued productive cough.  Reports nausea as well as emesis over the past 4 to 5 days most occurring posttussive.  Reports right lower abdominal cramping sensation that feels similar to menstrual cramps although not currently on her menstrual cycle.  Patient denies any abdominal pain currently.  Denies any fever, hematemesis, urinary symptoms, change in bowel habits..  Review of Systems  Positive: See above Negative:   Physical Exam  BP 115/82 (BP Location: Right Arm)   Pulse 85   Temp 98.5 F (36.9 C) (Oral)   Resp 16   SpO2 100%  Gen:   Awake, no distress   Resp:  Normal effort  MSK:   Moves extremities without difficulty  Other:    Medical Decision Making  Medically screening exam initiated at 12:39 PM.  Appropriate orders placed.  Lori Lloyd was informed that the remainder of the evaluation will be completed by another provider, this initial triage assessment does not replace that evaluation, and the importance of remaining in the ED until their evaluation is complete.     Peter Garter, [place] 03/22/23 1241

## 2023-03-22 NOTE — ED Triage Notes (Signed)
Pt c/o nausea and lightheaded x 4-5 days; denies emesis; denies known sick contacts; endorses productive cough, denies fevers;endorses abd cramping, denies urinary issues

## 2023-04-22 ENCOUNTER — Inpatient Hospital Stay (HOSPITAL_COMMUNITY): Payer: Medicaid Other

## 2023-04-22 ENCOUNTER — Other Ambulatory Visit: Payer: Self-pay

## 2023-04-22 ENCOUNTER — Inpatient Hospital Stay (HOSPITAL_BASED_OUTPATIENT_CLINIC_OR_DEPARTMENT_OTHER)
Admission: AD | Admit: 2023-04-22 | Discharge: 2023-04-22 | Disposition: A | Discharge status: Home / Self Care | Payer: Medicaid Other | Attending: Family Medicine | Admitting: Family Medicine

## 2023-04-22 ENCOUNTER — Encounter (HOSPITAL_COMMUNITY): Payer: Self-pay | Admitting: Family Medicine

## 2023-04-22 DIAGNOSIS — Z3A09 9 weeks gestation of pregnancy: Secondary | ICD-10-CM

## 2023-04-22 DIAGNOSIS — O26891 Other specified pregnancy related conditions, first trimester: Secondary | ICD-10-CM | POA: Diagnosis not present

## 2023-04-22 DIAGNOSIS — O99511 Diseases of the respiratory system complicating pregnancy, first trimester: Secondary | ICD-10-CM | POA: Diagnosis not present

## 2023-04-22 DIAGNOSIS — R109 Unspecified abdominal pain: Secondary | ICD-10-CM

## 2023-04-22 DIAGNOSIS — O219 Vomiting of pregnancy, unspecified: Secondary | ICD-10-CM | POA: Insufficient documentation

## 2023-04-22 LAB — CBC
HCT: 33.8 % — ABNORMAL LOW (ref 36.0–46.0)
Hemoglobin: 11.1 g/dL — ABNORMAL LOW (ref 12.0–15.0)
MCH: 30.9 pg (ref 26.0–34.0)
MCHC: 32.8 g/dL (ref 30.0–36.0)
MCV: 94.2 fL (ref 80.0–100.0)
Platelets: 235 10*3/uL (ref 150–400)
RBC: 3.59 MIL/uL — ABNORMAL LOW (ref 3.87–5.11)
RDW: 13.1 % (ref 11.5–15.5)
WBC: 8.4 10*3/uL (ref 4.0–10.5)
nRBC: 0 % (ref 0.0–0.2)

## 2023-04-22 LAB — PREGNANCY, URINE: Preg Test, Ur: POSITIVE — AB

## 2023-04-22 LAB — COMPREHENSIVE METABOLIC PANEL
ALT: 22 U/L (ref 0–44)
AST: 20 U/L (ref 15–41)
Albumin: 4.4 g/dL (ref 3.5–5.0)
Alkaline Phosphatase: 47 U/L (ref 38–126)
Anion gap: 8 (ref 5–15)
BUN: 11 mg/dL (ref 6–20)
CO2: 23 mmol/L (ref 22–32)
Calcium: 10 mg/dL (ref 8.9–10.3)
Chloride: 103 mmol/L (ref 98–111)
Creatinine, Ser: 0.58 mg/dL (ref 0.44–1.00)
GFR, Estimated: >60 mL/min (ref >60)
Glucose, Bld: 75 mg/dL (ref 70–99)
Potassium: 4 mmol/L (ref 3.5–5.1)
Sodium: 134 mmol/L — ABNORMAL LOW (ref 135–145)
Total Bilirubin: 0.5 mg/dL (ref 0.3–1.2)
Total Protein: 7.4 g/dL (ref 6.5–8.1)

## 2023-04-22 LAB — URINALYSIS, ROUTINE W REFLEX MICROSCOPIC
Bilirubin Urine: NEGATIVE
Glucose, UA: NEGATIVE mg/dL
Hgb urine dipstick: NEGATIVE
Ketones, ur: NEGATIVE mg/dL
Leukocytes,Ua: NEGATIVE
Nitrite: NEGATIVE
Protein, ur: NEGATIVE mg/dL
Specific Gravity, Urine: 1.025 (ref 1.005–1.030)
pH: 7 (ref 5.0–8.0)

## 2023-04-22 LAB — LIPASE, BLOOD: Lipase: 43 U/L (ref 11–51)

## 2023-04-22 LAB — HCG, QUANTITATIVE, PREGNANCY: hCG, Beta Chain, Quant, S: 112174 m[IU]/mL — ABNORMAL HIGH (ref <5)

## 2023-04-22 NOTE — ED Triage Notes (Signed)
Pt caox4, ambulatory c/o abd pain/cramping since Monday night. Confirmed pregnancy one month ago at the ED with N/V since then. Hasn't been able to hold down fluid for the past week. Pt denies any recent trauma to the abd. Pt denies vaginal bleeding or discharge. Pt states she has not yet established OB care and unsure on how far along in pregnancy she is.

## 2023-04-22 NOTE — ED Provider Notes (Signed)
[place] EMERGENCY DEPARTMENT AT [facility] Provider Note   CSN: [id number] Arrival date & time: 04/22/23  1149     History Chief Complaint  Patient presents with   Abdominal Pain    Lori Lloyd is a 27 y.o. female G67P2 female presents to the ER for evaluation of right lower quadrant cramping-like sensation for the past 3 days.  She is also reports that she is been having some nausea and vomiting for the past 3 days as well.  Around 3 episodes per day of nonbloody, nonbilious emesis.  She was seen earlier this month and told to follow-up with OB however she reports that she has been having some insurance issues and has not followed up.  She is also not taken any multivitamins or prenatal vitamins as well.  She has not taken any medication that she was instructed to take for her nausea and vomiting then as well.  She reports that she has had previous complications with pregnancies with placenta previa.  Her other pregnancies, excluding her living children, were elective abortions.  She denies any diarrhea, constipation, fever, chills, dysuria, hematuria, vaginal bleeding, vaginal discharge.  She reports that she had a normal bowel movement this morning with no melena or hematochezia.  Denies any trauma to the area.   Abdominal Pain Associated symptoms: nausea and vomiting   Associated symptoms: no chest pain, no chills, no constipation, no diarrhea, no dysuria, no fever, no hematuria, no shortness of breath, no vaginal bleeding and no vaginal discharge        Home Medications Prior to Admission medications   Medication Sig Start Date End Date Taking? Authorizing Provider  loratadine (CLARITIN) 10 MG tablet Take 1 tablet (10 mg total) by mouth daily. 09/30/16   Armando Reichert, CNM  naproxen (NAPROSYN) 500 MG tablet Take 1 tablet (500 mg total) by mouth 2 (two) times daily. 11/25/21   Cecil Cobbs, PA-C  ondansetron (ZOFRAN) 4 MG tablet Take 1 tablet (4 mg total) by  mouth every 6 (six) hours. 03/22/23   Roemhildt, Lorin T, PA-C  predniSONE (DELTASONE) 50 MG tablet 1 tablet PO QD X4 days 02/08/21   Zadie Rhine, MD  Prenatal Vit-Fe Fumarate-FA (PRENATAL MULTIVITAMIN) TABS tablet Take 1 tablet by mouth daily at 12 noon. Patient not taking: Reported on 06/25/2016 06/09/16   Hermina Staggers, MD      Allergies    Shellfish allergy    Review of Systems   Review of Systems  Constitutional:  Negative for chills and fever.  Respiratory:  Negative for shortness of breath.   Cardiovascular:  Negative for chest pain.  Gastrointestinal:  Positive for abdominal pain, nausea and vomiting. Negative for blood in stool, constipation and diarrhea.  Genitourinary:  Negative for dysuria, hematuria, vaginal bleeding and vaginal discharge.    Physical Exam Updated Vital Signs BP (!) 95/55   Pulse 73   Temp 98.2 F (36.8 C) (Oral)   Resp 18   Ht 5\' 1"  (1.549 m)   Wt 54.4 kg   LMP 02/16/2023 (Approximate)   SpO2 100%   BMI 22.67 kg/m  Physical Exam Vitals and nursing note reviewed.  Constitutional:      General: She is not in acute distress.    Appearance: She is not toxic-appearing.  HENT:     Mouth/Throat:     Mouth: Mucous membranes are moist.  Eyes:     General: No scleral icterus. Cardiovascular:     Rate and Rhythm: Normal  rate.  Pulmonary:     Effort: Pulmonary effort is normal. No respiratory distress.  Abdominal:     General: Bowel sounds are normal. There is no distension.     Palpations: Abdomen is soft.     Tenderness: There is abdominal tenderness in the right lower quadrant. There is no guarding or rebound.     Comments: Minimal right lower quadrant tenderness palpation.  No guarding or rebound noted.  Abdomen soft.  Normal active bowel sounds.  No overlying skin changes noted other than faded stretch marks.  Skin:    General: Skin is warm and dry.  Neurological:     Mental Status: She is alert.     ED Results / Procedures /  Treatments   Labs (all labs ordered are listed, but only abnormal results are displayed) Labs Reviewed  COMPREHENSIVE METABOLIC PANEL - Abnormal; Notable for the following components:      Result Value   Sodium 134 (*)    All other components within normal limits  CBC - Abnormal; Notable for the following components:   RBC 3.59 (*)    Hemoglobin 11.1 (*)    HCT 33.8 (*)    All other components within normal limits  PREGNANCY, URINE - Abnormal; Notable for the following components:   Preg Test, Ur POSITIVE (*)    All other components within normal limits  HCG, QUANTITATIVE, PREGNANCY - Abnormal; Notable for the following components:   hCG, Beta Chain, Quant, S 112,174 (*)    All other components within normal limits  LIPASE, BLOOD  URINALYSIS, ROUTINE W REFLEX MICROSCOPIC    EKG None  Radiology No results found.  Procedures Procedures   Medications Ordered in ED Medications - No data to display  ED Course/ Medical Decision Making/ A&P    Medical Decision Making Amount and/or Complexity of Data Reviewed Labs: ordered.   27 y.o. female presents to the ER for evaluation of RLQ cramping with N/V. Differential diagnosis includes but is not limited to ectopic pregnancy, mucles crmpaing, round ligament pain. Vital signs unremarkable. Physical exam as noted above.   On previous chart evaluation, patient was seen beginning of October for similar complaints with right lower quadrant cramping and nausea and vomiting.  She does not have ultrasound done at this time.  I do not see any history of ultrasound to confirm IUP.  I independently reviewed and interpreted the patient's labs.  hCG quant at 112,174.  Lipase within normal limits.  CBC does show slight anemia however does appear to be out of baseline.  Urinalysis unremarkable.  CMP shows mildly decreased sodium 134 otherwise no electrolyte or LFT abnormality.  I think this is less likely appendicitis given that she does not  have any leukocytosis, is relatively well-appearing with stable vital signs.  There is no guarding or rebounding to the area.  Will give her strict return precautions on this however I do feel like it is reasonable for her to follow-up with the MAU for confirmation of IUP to rule out an ectopic.  Spoke with Raelyn Mora, CNM at the MAU. Given that we do not have [place] services, the patient is having RLQ pain, and there is no confirmed IUP, I would like to send her over for further evaluation. Ms. Arita Miss is in agreeance.  The patient reports that she feels safe to drive. Address to the facility was given. I instructed her to go straight there and to obey traffic signs and laws and to wear her seatbelt.  If any concern/ new or worsening symptom to pull over and call 911.  Patient will arrive POV.  Portions of this report may have been transcribed using voice recognition software. Every effort was made to ensure accuracy; however, inadvertent computerized transcription errors may be present.   Final Clinical Impression(s) / ED Diagnoses Final diagnoses:  Abdominal pain during pregnancy in first trimester  [redacted] weeks gestation of pregnancy    Rx / DC Orders ED Discharge Orders     None         Achille Rich, PA-C 04/22/23 2101    Anders Simmonds T, DO 04/23/23 1235

## 2023-04-22 NOTE — MAU Provider Note (Signed)
Faculty Practice OB/GYN Attending MAU Note  Chief Complaint: Abdominal Pain    Event Date/Time   First Provider Initiated Contact with Patient 04/22/23 1736      SUBJECTIVE Lori Lloyd is a 27 y.o. Z6X0960 at [redacted]w[redacted]d by LMP who presents with abdominal pain. Transferred here for [facility] ED. Had labs there. No confirmation of IUP. Sent for u/s only. Has RLQ pain  and N/V.   Past Medical History:  Diagnosis Date   Asthma    Depression    Eczema    Fracture    ring finger right hand   OB History  Gravida Para Term Preterm AB Living  5 2 1 1 2 2   SAB IAB Ectopic Multiple Live Births  0 2 0 0 2    # Outcome Date GA Lbr Len/2nd Weight Sex Type Anes PTL Lv  5 Current           4 Preterm 06/26/16 [redacted]w[redacted]d  2200 g M CS-LTranv Spinal  LIV     Birth Comments: preterm  3 IAB 2015          2 IAB 05/2013          1 Term 10/03/12 [redacted]w[redacted]d 06:03 / 00:22 3541 g F Vag-Spont EPI  LIV   Past Surgical History:  Procedure Laterality Date   CESAREAN SECTION N/A 06/26/2016   Procedure: CESAREAN SECTION;  Surgeon: Lesly Dukes, MD;  Location: [facility] BIRTHING SUITES;  Service: Obstetrics;  Laterality: N/A;   INDUCED ABORTION     WISDOM TOOTH EXTRACTION     Social History   Socioeconomic History   Marital status: Single    Spouse name: Not on file   Number of children: Not on file   Years of education: Not on file   Highest education level: Not on file  Occupational History   Not on file  Tobacco Use   Smoking status: Former    Current packs/day: 0.25    Average packs/day: 0.3 packs/day for 0.5 years (0.1 ttl pk-yrs)    Types: Cigarettes    Passive exposure: Never   Smokeless tobacco: Never   Tobacco comments:    with preg  Vaping Use   Vaping status: Never Used  Substance and Sexual Activity   Alcohol use: No   Drug use: No    Types: Marijuana    Comment: prior to preg   Sexual activity: Yes    Birth control/protection: None    Comment: two weeks ago  Other Topics Concern   Not on  file  Social History Narrative   Not on file   Social Determinants of Health   Financial Resource Strain: Not on file  Food Insecurity: Not on file  Transportation Needs: Not on file  Physical Activity: Not on file  Stress: Not on file  Social Connections: Not on file  Intimate Partner Violence: Not on file   No current facility-administered medications on file prior to encounter.   Current Outpatient Medications on File Prior to Encounter  Medication Sig Dispense Refill   loratadine (CLARITIN) 10 MG tablet Take 1 tablet (10 mg total) by mouth daily. 30 tablet 0   naproxen (NAPROSYN) 500 MG tablet Take 1 tablet (500 mg total) by mouth 2 (two) times daily. 30 tablet 0   ondansetron (ZOFRAN) 4 MG tablet Take 1 tablet (4 mg total) by mouth every 6 (six) hours. 12 tablet 0   predniSONE (DELTASONE) 50 MG tablet 1 tablet PO QD X4 days 4 tablet  0   Prenatal Vit-Fe Fumarate-FA (PRENATAL MULTIVITAMIN) TABS tablet Take 1 tablet by mouth daily at 12 noon. (Patient not taking: Reported on 06/25/2016) 30 tablet 12   Allergies  Allergen Reactions   Shellfish Allergy Anaphylaxis    ROS: Pertinent items in HPI  OBJECTIVE BP 104/61   Pulse 90   Temp 98.2 F (36.8 C)   Resp 18   Ht 5\' 1"  (1.549 m)   Wt 54.4 kg   LMP 02/16/2023 (Approximate)   SpO2 99%   BMI 22.67 kg/m  CONSTITUTIONAL: Well-developed, well-nourished female in no acute distress.  HENT:  Normocephalic, atraumatic, External right and left ear normal. Oropharynx is clear and moist EYES: Conjunctivae and EOM are normal. No scleral icterus.  NECK: Normal range of motion, supple, no masses.  Normal thyroid.  SKIN: Skin is warm and dry. No rash noted. Not diaphoretic. No erythema. No pallor. NEUROLGIC: Alert and oriented to person, place, and time.  CARDIOVASCULAR: Normal heart rate noted RESPIRATORY: Effort and breath sounds normal, no problems with respiration noted. ABDOMEN: Soft, normal bowel sounds, no distention noted.   No tenderness, rebound or guarding.  MUSCULOSKELETAL: Normal range of motion. No tenderness.  No cyanosis, clubbing, or edema.  2+ distal pulses.  LAB RESULTS Results for orders placed or performed during the hospital encounter of 04/22/23 (from the past 48 hour(s))  Lipase, blood     Status: None   Collection Time: 04/22/23 12:10 PM  Result Value Ref Range   Lipase 43 11 - 51 U/L    Comment: Performed at [facility], [address], [place], [place] [address]  Comprehensive metabolic panel     Status: Abnormal   Collection Time: 04/22/23 12:10 PM  Result Value Ref Range   Sodium 134 (L) 135 - 145 mmol/L   Potassium 4.0 3.5 - 5.1 mmol/L   Chloride 103 98 - 111 mmol/L   CO2 23 22 - 32 mmol/L   Glucose, Bld 75 70 - 99 mg/dL    Comment: Glucose reference range applies only to samples taken after fasting for at least 8 hours.   BUN 11 6 - 20 mg/dL   Creatinine, Ser 6.04 0.44 - 1.00 mg/dL   Calcium 54.0 8.9 - 98.1 mg/dL   Total Protein 7.4 6.5 - 8.1 g/dL   Albumin 4.4 3.5 - 5.0 g/dL   AST 20 15 - 41 U/L   ALT 22 0 - 44 U/L   Alkaline Phosphatase 47 38 - 126 U/L   Total Bilirubin 0.5 0.3 - 1.2 mg/dL   GFR, Estimated >19 >14 mL/min    Comment: (NOTE) Calculated using the CKD-EPI Creatinine Equation (2021)    Anion gap 8 5 - 15    Comment: Performed at [facility], [address] [place], [place] [address]  CBC     Status: Abnormal   Collection Time: 04/22/23 12:10 PM  Result Value Ref Range   WBC 8.4 4.0 - 10.5 K/uL   RBC 3.59 (L) 3.87 - 5.11 MIL/uL   Hemoglobin 11.1 (L) 12.0 - 15.0 g/dL   HCT 62.1 (L) 30.8 - 65.7 %   MCV 94.2 80.0 - 100.0 fL   MCH 30.9 26.0 - 34.0 pg   MCHC 32.8 30.0 - 36.0 g/dL   RDW 84.6 96.2 - 95.2 %   Platelets 235 150 - 400 K/uL   nRBC 0.0 0.0 - 0.2 %    Comment: Performed at [facility], [address] [place], [place] [address]  hCG, quantitative, pregnancy     Status: Abnormal    Collection Time: 04/22/23 12:56 PM  Result Value Ref Range   hCG, Beta Chain, Quant, S 112,174 (H) <5 mIU/mL    Comment:          GEST. AGE      CONC.  (mIU/mL)   <=1 WEEK        5 - 50     2 WEEKS       50 - 500     3 WEEKS       100 - 10,000     4 WEEKS     1,000 - 30,000     5 WEEKS     3,500 - 115,000   6-8 WEEKS     12,000 - 270,000    12 WEEKS     15,000 - 220,000        FEMALE AND NON-PREGNANT FEMALE:     LESS THAN 5 mIU/mL RESULT CONFIRMED BY AUTOMATED DILUTION Performed at [facility], [address], [place], [place] [address]   Urinalysis, Routine w reflex microscopic -Urine, Clean Catch     Status: None   Collection Time: 04/22/23  1:12 PM  Result Value Ref Range   Color, Urine YELLOW YELLOW   APPearance CLEAR CLEAR   Specific Gravity, Urine 1.025 1.005 - 1.030   pH 7.0 5.0 - 8.0   Glucose, UA NEGATIVE NEGATIVE mg/dL   Hgb urine dipstick NEGATIVE NEGATIVE   Bilirubin Urine NEGATIVE NEGATIVE   Ketones, ur NEGATIVE NEGATIVE mg/dL   Protein, ur NEGATIVE NEGATIVE mg/dL   Nitrite NEGATIVE NEGATIVE   Leukocytes,Ua NEGATIVE NEGATIVE    Comment: Performed at [facility], [address] [place], [place] [address]  Pregnancy, urine     Status: Abnormal   Collection Time: 04/22/23  1:12 PM  Result Value Ref Range   Preg Test, Ur POSITIVE (A) NEGATIVE    Comment:        THE SENSITIVITY OF THIS METHODOLOGY IS >25 mIU/mL. Performed at [facility], [address], [place], [place] [address]     IMAGING No results found.  MAU COURSE Images reviewed by my self Shows Single living IUP with FHR 151 No free fluid  ASSESSMENT 1. Abdominal pain during pregnancy in first trimester   2. [redacted] weeks gestation of pregnancy    PUL - IUP confirmed  PLAN Discharge home [facility] - places to start reviewed. Information given.   Follow-up Information     [facility]. Go to .   Specialty:  Obstetrics and Gynecology Contact information: [address] [place] [address] [phone]        [facility] ULTRASOUND .   Specialty: Radiology Contact information: [address] [place] [address] [phone]               Allergies as of 04/22/2023       Reactions   Shellfish Allergy Anaphylaxis        Medication List     STOP taking these medications    naproxen 500 MG tablet Commonly known as: NAPROSYN   predniSONE 50 MG tablet Commonly known as: DELTASONE       TAKE these medications    loratadine 10 MG tablet Commonly known as: Claritin Take 1 tablet (10 mg total) by mouth daily.   ondansetron 4 MG tablet Commonly known as: ZOFRAN Take 1 tablet (4 mg  total) by mouth every 6 (six) hours.   prenatal multivitamin Tabs tablet Take 1 tablet by mouth daily at 12 noon.        Evaluation does not show pathology that would require ongoing emergent intervention or inpatient treatment. Patient is hemodynamically stable and mentating appropriately. Discussed findings and plan with patient, who agrees with care plan. All questions answered. Return precautions discussed and outpatient follow up recommendations given.  Reva Bores, MD 04/22/2023 5:40 PM

## 2023-04-22 NOTE — MAU Note (Addendum)
Pt transferred from [facility] w c/o n/v and abd pain on her right side for about a week . Labs WNL. Sent to have U/S. Able to doppler FHR in Triage.   FHR 159

## 2023-04-22 NOTE — Discharge Instructions (Addendum)

## 2023-05-06 LAB — OB RESULTS CONSOLE ABO/RH
ABO/RH(D): O POS
RH Type: POSITIVE

## 2023-05-06 LAB — OB RESULTS CONSOLE ANTIBODY SCREEN: Antibody Screen: NEGATIVE

## 2023-05-06 LAB — OB RESULTS CONSOLE HIV ANTIBODY (ROUTINE TESTING): HIV: NONREACTIVE

## 2023-05-06 LAB — OB RESULTS CONSOLE HEPATITIS B SURFACE ANTIGEN: Hepatitis B Surface Ag: NEGATIVE

## 2023-05-06 LAB — HEPATITIS C ANTIBODY: HCV Ab: NEGATIVE

## 2023-05-06 LAB — OB RESULTS CONSOLE GC/CHLAMYDIA: Chlamydia: NEGATIVE

## 2023-05-06 LAB — OB RESULTS CONSOLE RPR: RPR: NONREACTIVE

## 2023-05-06 LAB — OB RESULTS CONSOLE RUBELLA ANTIBODY, IGM: Rubella: UNDETERMINED

## 2023-06-18 ENCOUNTER — Inpatient Hospital Stay (HOSPITAL_COMMUNITY)
Admission: AD | Admit: 2023-06-18 | Discharge: 2023-06-18 | Disposition: A | Discharge status: Home / Self Care | Payer: Medicaid Other | Attending: Family Medicine | Admitting: Family Medicine

## 2023-06-18 DIAGNOSIS — O34211 Maternal care for low transverse scar from previous cesarean delivery: Secondary | ICD-10-CM | POA: Diagnosis not present

## 2023-06-18 DIAGNOSIS — Z3A17 17 weeks gestation of pregnancy: Secondary | ICD-10-CM | POA: Diagnosis not present

## 2023-06-18 DIAGNOSIS — N858 Other specified noninflammatory disorders of uterus: Secondary | ICD-10-CM | POA: Diagnosis not present

## 2023-06-18 DIAGNOSIS — O368121 Decreased fetal movements, second trimester, fetus 1: Secondary | ICD-10-CM

## 2023-06-18 DIAGNOSIS — O09292 Supervision of pregnancy with other poor reproductive or obstetric history, second trimester: Secondary | ICD-10-CM | POA: Insufficient documentation

## 2023-06-18 DIAGNOSIS — O368130 Decreased fetal movements, third trimester, not applicable or unspecified: Secondary | ICD-10-CM | POA: Diagnosis present

## 2023-06-18 NOTE — MAU Note (Signed)
.  Lori Lloyd is a 27 y.o. at [redacted]w[redacted]d here in MAU reporting: Fearful as she has not felt any movement since Thursday morning around 0400. Denies VB, LOF, or concerning discharge. She reports occasional round ligament pain.  Patient reports she has been fearful as her "placenta burst and I had to have an emergency c/s last time." She also reports Instagram and TikTok have caused her to grow anxious.  Next OB appointment on 1/7.  Onset of complaint: Three days Pain score: Denies current pain.  Vitals:   06/18/23 0834  BP: 112/66  Pulse: 97  Resp: 16  Temp: 98.6 F (37 C)  SpO2: 100%      FHT: 146 doppler Lab orders placed from triage: none

## 2023-06-18 NOTE — Discharge Instructions (Signed)
Follow up with your Primary OB as scheduled. Return precautions reviewed and return to MAU if any concerns

## 2023-06-18 NOTE — MAU Provider Note (Signed)
Chief Complaint:  Decreased Fetal Movement   HPI   Event Date/Time   First Provider Initiated Contact with Patient 06/18/23 0842      Lori Lloyd is a 27 y.o. Z6X0960 at [redacted]w[redacted]d who presents to  the MAU reporting decreased FM since last night and this morning. Patient reports that she has previously felt fetal movements and that is why she was concerned today when no movements were felt. She denies any LOF, VB, reports no cramping and has some N/V managed well with her antiemetics at home.   Pregnancy Course: Patient receives care in [facility] office and has scheduled F/U on 06/28/23. Patient reports uncomplicated thus far with h/o LTCS undecided if will TOLAC.   Past Medical History:  Diagnosis Date   Asthma    Depression    Eczema    Fracture    ring finger right hand   OB History  Gravida Para Term Preterm AB Living  5 2 1 1 2 2   SAB IAB Ectopic Multiple Live Births  0 2 0 0 2    # Outcome Date GA Lbr Len/2nd Weight Sex Type Anes PTL Lv  5 Current           4 Preterm 06/26/16 [redacted]w[redacted]d  2200 g M CS-LTranv Spinal  LIV     Birth Comments: preterm  3 IAB 2015          2 IAB 05/2013          1 Term 10/03/12 [redacted]w[redacted]d 06:03 / 00:22 3541 g F Vag-Spont EPI  LIV   Past Surgical History:  Procedure Laterality Date   CESAREAN SECTION N/A 06/26/2016   Procedure: CESAREAN SECTION;  Surgeon: Lesly Dukes, MD;  Location: [facility] BIRTHING SUITES;  Service: Obstetrics;  Laterality: N/A;   INDUCED ABORTION     WISDOM TOOTH EXTRACTION     Family History  Problem Relation Age of Onset   Diabetes Maternal Grandmother    Hypertension Maternal Grandmother    Hearing loss Neg Hx    Social History   Tobacco Use   Smoking status: Former    Current packs/day: 0.25    Average packs/day: 0.3 packs/day for 0.5 years (0.1 ttl pk-yrs)    Types: Cigarettes    Passive exposure: Never   Smokeless tobacco: Never   Tobacco comments:    with preg  Vaping Use   Vaping status: Never Used  Substance Use Topics    Alcohol use: No   Drug use: No    Types: Marijuana    Comment: prior to preg   Allergies  Allergen Reactions   Shellfish Allergy Anaphylaxis   No medications prior to admission.    I have reviewed patient's Past Medical Hx, Surgical Hx, Family Hx, Social Hx, medications and allergies.   ROS  Pertinent items noted in HPI and remainder of comprehensive ROS otherwise negative.   PHYSICAL EXAM  Patient Vitals for the past 24 hrs:  BP Temp Temp src Pulse Resp SpO2 Height Weight  06/18/23 0834 112/66 98.6 F (37 C) Oral 97 16 100 % 5\' 1"  (1.549 m) 64 kg    Constitutional: Well-developed, well-nourished female in no acute distress.  Cardiovascular: normal rate & rhythm, warm and well-perfused Respiratory: normal effort, no problems with respiration noted GI: Abd soft, non-tender, non-distended MS: Extremities nontender, no edema, normal ROM Neurologic: Alert and oriented x 4.     Fetal Assessment: Doppler at 146   Labs: No results found for this or any  previous visit (from the past 24 hours).  Imaging:   Pt informed that the ultrasound is considered a limited OB ultrasound and is not intended to be a complete ultrasound exam.  Patient also informed that the ultrasound is not being completed with the intent of assessing for fetal or placental anomalies or any pelvic abnormalities.  Explained that the purpose of today's ultrasound is to assess for   fetal movements  and viability.  Patient acknowledges the purpose of the exam and the limitations of the study.     MDM & MAU COURSE  MDM:  LOW  MAU Course:   Patient was seen and evaluated for decreased fetal movement @ 17.3 GA. Her PNR were reviewed, nurses note, vital signs, and medications.   Bedside limited ultrasound was performed for maternal reassurance by me and interpreted by me  Visual movements were seen with FHR visualized on ultrasound. Patient felt reassured . Stable for discharge.    ASSESSMENT   1.  Decreased fetal movement affecting management of pregnancy in second trimester, fetus 1 of multiple gestation   2. [redacted] weeks gestation of pregnancy     PLAN  Discharge home in stable condition with return precautions.  Primary OB f/u as scheduled 06/28/23    Allergies as of 06/18/2023       Reactions   Shellfish Allergy Anaphylaxis        Medication List     TAKE these medications    loratadine 10 MG tablet Commonly known as: Claritin Take 1 tablet (10 mg total) by mouth daily.   ondansetron 4 MG tablet Commonly known as: ZOFRAN Take 1 tablet (4 mg total) by mouth every 6 (six) hours.   prenatal multivitamin Tabs tablet Take 1 tablet by mouth daily at 12 noon.       Marcell Barlow, MSN, [facility], Center for [employer]

## 2023-06-22 NOTE — L&D Delivery Note (Signed)
 OB/GYN Faculty Practice Delivery Note  Lori Lloyd is a 28 y.o. Z6X0960 s/p SVD at [redacted]w[redacted]d. She was admitted for IOL (TOLAC) 2/2 DFM.   ROM: 8h 63m with moderate meconium-stained fluid GBS Status: Negative/-- (05/07 0951) Maximum Maternal Temperature: Temp (24hrs), Avg:98.4 F (36.9 C), Min:97.9 F (36.6 C), Max:99.2 F (37.3 C)   Labor Progress: Initial SVE: 2/50/-3. AROM, Pitocin , and IP Foley required. She then progressed to complete.   Delivery Date/Time: 11/19/23 0748 Delivery: Called to room and patient was complete and pushing. NICU called to bedside given thick meconium by delivery. Head delivered OA to ROA. No nuchal cord present. Shoulder and body delivered in usual fashion. Infant with spontaneous cry, placed on mother's abdomen, dried and stimulated. Cord clamped x 2 after +1-minute delay, and cut by patient's mother. Cord gases not obtained. Cord blood drawn. Placenta delivered spontaneously with gentle cord traction. Fundus firm with massage and Pitocin . Labia, perineum, and vagina inspected with 1st degree perineal which were hemostatic and did not require repair. Mom and baby to postpartum. Baby Weight: pending  Placenta: 3 vessel, intact. Sent to L&D Complications: None Lacerations: 1st degree perineal EBL: 100 mL Anesthesia: epidural  Infant: baby girl APGAR (1 MIN): 9  APGAR (5 MINS): 9  APGAR (10 MINS):    Maud Sorenson, MD [facility] Family Medicine Fellow, [facility] for [facility], [facility] 11/19/2023, 8:11 AM

## 2023-07-07 LAB — OB RESULTS CONSOLE HGB/HCT, BLOOD
HCT: 32 (ref 29–41)
Hemoglobin: 11.1

## 2023-07-07 LAB — OB RESULTS CONSOLE PLATELET COUNT: Platelets: 210

## 2023-07-12 ENCOUNTER — Other Ambulatory Visit: Payer: Self-pay

## 2023-07-12 DIAGNOSIS — Z3A20 20 weeks gestation of pregnancy: Secondary | ICD-10-CM

## 2023-07-12 DIAGNOSIS — O09899 Supervision of other high risk pregnancies, unspecified trimester: Secondary | ICD-10-CM | POA: Insufficient documentation

## 2023-07-12 MED ORDER — BLOOD PRESSURE KIT DEVI
1.0000 | 0 refills | Status: DC
Start: 2023-07-12 — End: 2023-12-30

## 2023-07-12 MED ORDER — GOJJI WEIGHT SCALE MISC
1.0000 | 0 refills | Status: DC
Start: 2023-07-12 — End: 2023-10-28

## 2023-07-13 ENCOUNTER — Telehealth: Payer: Self-pay

## 2023-07-13 DIAGNOSIS — O09899 Supervision of other high risk pregnancies, unspecified trimester: Secondary | ICD-10-CM

## 2023-07-13 NOTE — Telephone Encounter (Signed)
TC to Pt to start Nurse Intake visit. No answer, LVMM to call Office to start visit

## 2023-07-14 ENCOUNTER — Ambulatory Visit (INDEPENDENT_AMBULATORY_CARE_PROVIDER_SITE_OTHER): Payer: Medicaid Other

## 2023-07-14 VITALS — BP 105/65 | HR 105 | Wt 154.6 lb

## 2023-07-14 DIAGNOSIS — O09899 Supervision of other high risk pregnancies, unspecified trimester: Secondary | ICD-10-CM | POA: Diagnosis not present

## 2023-07-14 DIAGNOSIS — Z8759 Personal history of other complications of pregnancy, childbirth and the puerperium: Secondary | ICD-10-CM

## 2023-07-14 DIAGNOSIS — K5900 Constipation, unspecified: Secondary | ICD-10-CM

## 2023-07-14 DIAGNOSIS — Z3A21 21 weeks gestation of pregnancy: Secondary | ICD-10-CM

## 2023-07-14 DIAGNOSIS — Z98891 History of uterine scar from previous surgery: Secondary | ICD-10-CM | POA: Diagnosis not present

## 2023-07-14 DIAGNOSIS — O99342 Other mental disorders complicating pregnancy, second trimester: Secondary | ICD-10-CM | POA: Diagnosis not present

## 2023-07-14 DIAGNOSIS — O09892 Supervision of other high risk pregnancies, second trimester: Secondary | ICD-10-CM

## 2023-07-14 DIAGNOSIS — O99612 Diseases of the digestive system complicating pregnancy, second trimester: Secondary | ICD-10-CM

## 2023-07-14 DIAGNOSIS — F32A Depression, unspecified: Secondary | ICD-10-CM

## 2023-07-14 NOTE — Progress Notes (Signed)
Subjective:   Lori Lloyd is a 28 y.o. [id number] at [redacted]w[redacted]d being seen today for her first obstetrical visit with [facility].  Patient reports she transferred from Dr. Shawnie Pons and last appt was Jan 7th. Patient denies problems during pregnancy.  She states she transferred b/c she felt she was not getting "quality care."   Gynecological/Obstetrical History: Patient denies history of gynecological surgeries.  Patient reports history of abnormal pap smears. States she has one "at the beginning of 2024" and it was abnormal, but she did not return because of fear.    Pregnancy history fully reviewed. Patient does intend to breast feed. Patient obstetrical history is significant for  C/S, PTD (33wks), placenta previa .   Sexual Activity and Vaginal Concerns: Patient is currently sexually active and denies pain or discomfort during intercourse.  She also denies vaginal discharge, bleeding, irritation, or odor. Patient also denies pain or difficulty with urination.    Medical History/ROS: Patient denies medical history significant for cardiovascular, gastrointestinal, or hematological disorders. She reports history of asthma and last inhaler usage last night. However, she doesn't think she was having issues breathing, just too much eating.   Patient endorses history of depression and states she was taking medication and reports last dosage was 2 days ago. She endorses feelings of depression.    Patient reports constipation, but is not taking anything.  Patient denies diarrhea or nausea/vomiting.  No recurrent headaches.    Social History: Patient endorses current usage of tobacco.  She reports smoking between 4-8/day depending on day. No current vaping, alcohol, or drugs.  Patient reports the FOB is not involved. She reports her 63 year old daughter will be her support while in labor. Patient reports that she lives with self and 2 children and endorses safety at home.  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  HISTORY: OB History  Gravida Para Term Preterm AB Living  6 2 1 1 3 2   SAB IAB Ectopic Multiple Live Births  0 3 0 0 2    # Outcome Date GA Lbr Len/2nd Weight Sex Type Anes PTL Lv  6 Current           5 Preterm 06/26/16 [redacted]w[redacted]d  4 lb 13.6 oz (2.2 kg) M CS-LTranv Spinal  LIV     Birth Comments: preterm     Name: Lori, Lloyd     Apgar1: 8  Apgar5: 9  4 IAB 2015          3 IAB 05/2013          2 Term 10/03/12 [redacted]w[redacted]d 06:03 / 00:22 7 lb 12.9 oz (3.541 kg) F Vag-Spont EPI  LIV     Name: Lori, Lloyd     Apgar1: 8  Apgar5: 9  1 IAB             Last pap smear was done 2024 and was abnormal - per patient  Past Medical History:  Diagnosis Date   Asthma    Depression    Eczema    Fracture    ring finger right hand   Past Surgical History:  Procedure Laterality Date   CESAREAN SECTION N/A 06/26/2016   Procedure: CESAREAN SECTION;  Surgeon: Lesly Dukes, MD;  Location: [facility];  Service: Obstetrics;  Laterality: N/A;   INDUCED ABORTION     WISDOM TOOTH EXTRACTION     Family History  Problem Relation Age of Onset   Diabetes  Maternal Grandmother    Hypertension Maternal Grandmother    Hearing loss Neg Hx    Social History   Tobacco Use   Smoking status: Every Day    Current packs/day: 0.25    Average packs/day: 0.3 packs/day for 0.5 years (0.1 ttl pk-yrs)    Types: Cigarettes    Passive exposure: Never   Smokeless tobacco: Never   Tobacco comments:    with preg  Vaping Use   Vaping status: Never Used  Substance Use Topics   Alcohol use: No   Drug use: No    Types: Marijuana    Comment: prior to preg   Allergies  Allergen Reactions   Shellfish Allergy Anaphylaxis   Current Outpatient Medications on File Prior to Visit  Medication Sig Dispense Refill   Blood Pressure Monitoring (BLOOD PRESSURE KIT) DEVI 1 kit by Does not apply route once a week. Check Blood Pressure regularly and record readings into the  Babyscripts App.  Large Cuff.  DX O90.0 1 each 0   Misc. Devices (GOJJI WEIGHT SCALE) MISC 1 Device by Does not apply route once a week. 1 each 0   ondansetron (ZOFRAN) 4 MG tablet Take 1 tablet (4 mg total) by mouth every 6 (six) hours. 12 tablet 0   loratadine (CLARITIN) 10 MG tablet Take 1 tablet (10 mg total) by mouth daily. (Patient not taking: Reported on 07/14/2023) 30 tablet 0   Prenatal Vit-Fe Fumarate-FA (PRENATAL MULTIVITAMIN) TABS tablet Take 1 tablet by mouth daily at 12 noon. 30 tablet 12   No current facility-administered medications on file prior to visit.    Review of Systems Pertinent items noted in HPI and remainder of comprehensive ROS otherwise negative.  Exam   Vitals:   07/14/23 1018  BP: 105/65  Pulse: (!) 105  Weight: 154 lb 9.6 oz (70.1 kg)   Fetal Heart Rate (bpm): 154  OBGyn Exam  Assessment:   28 y.o. year old [id number] Patient Active Problem List   Diagnosis Date Noted   Supervision of other high risk pregnancy, antepartum 07/12/2023   History of cesarean section 06/29/2016   Vaginal bleeding 06/25/2016   Low-lying placenta 06/15/2016   History of placenta previa 06/09/2016   Depressive disorder 02/18/2013   Supervision of high-risk pregnancy 02/18/2013     Plan:  1. Supervision of other high risk pregnancy, antepartum -Congratulations given and patient welcomed to practice. -Discussed obtaining records and will review and determine need for additional testing and/or labs.  -Patient reports she was placed OOW (work from home) by Dr. Shawnie Pons.  Informed that we will not provide any said documentation until able to review records.  -Ultrasound discussed; fetal anatomic survey: ordered. -Discussed estimated due date of November 23, 2023. -Continue prenatal vitamins  -Influenza offered and declined. -Encouraged to seek out care at office or emergency room for urgent and/or emergent concerns. -Educated on the nature of [facility]  Faculty Practice with multiple MDs and other Advanced Practice Providers was explained to patient; also emphasized that residents, students are part of our team. Informed of her right to refuse care as she deems appropriate.  -Encouraged to complete and utilize MyChart Registration for her ability to review results, send requests, and have questions addressed.  -No questions or concerns.    2. History of placenta previa -[place] ordered .  3. History of cesarean section -Desires TOLAC -Will schedule with MD for r/b and consent.  4. Depressive disorder -PHQ 9 today, 13  -Ambulatory  Referral placed  5. History of preterm delivery, currently pregnant in second trimester -33 weeks d/t previa     Problem list reviewed and updated. Routine obstetric precautions reviewed.  Orders Placed This Encounter  Procedures   [place] MFM OB DETAIL +14 WK    Standing Status:   Future    Expected Date:   07/28/2023    Expiration Date:   01/11/2024    Reason for Exam (SYMPTOM  OR DIAGNOSIS REQUIRED):   H/O C/S, H/O PTD, H/o Placenta Previa    [facility]erred [facility]:   [facility]   Ambulatory referral to Integrated Behavioral Health    Referral Priority:   Routine    Referral Type:   Consultation    Referral Reason:   Specialty Services Required    Number of Visits Requested:   1    No follow-ups on file.     Cherre Robins, CNM 07/14/2023 10:54 AM

## 2023-07-14 NOTE — Progress Notes (Signed)
Pt presents for NOB visit. Pt taken "anxiety medication". Does not want to continue  Pt reports PAP in last 3 years, reports abnormal PAP. Requesting BH referral

## 2023-07-15 ENCOUNTER — Inpatient Hospital Stay (HOSPITAL_COMMUNITY)
Admission: AD | Admit: 2023-07-15 | Discharge: 2023-07-15 | Disposition: A | Discharge status: Home / Self Care | Payer: Medicaid Other | Attending: Obstetrics & Gynecology | Admitting: Obstetrics & Gynecology

## 2023-07-15 DIAGNOSIS — R519 Headache, unspecified: Secondary | ICD-10-CM | POA: Diagnosis not present

## 2023-07-15 DIAGNOSIS — O26892 Other specified pregnancy related conditions, second trimester: Secondary | ICD-10-CM | POA: Insufficient documentation

## 2023-07-15 DIAGNOSIS — O219 Vomiting of pregnancy, unspecified: Secondary | ICD-10-CM | POA: Insufficient documentation

## 2023-07-15 DIAGNOSIS — Z3A21 21 weeks gestation of pregnancy: Secondary | ICD-10-CM | POA: Diagnosis not present

## 2023-07-15 LAB — URINALYSIS, ROUTINE W REFLEX MICROSCOPIC
Bilirubin Urine: NEGATIVE
Glucose, UA: NEGATIVE mg/dL
Hgb urine dipstick: NEGATIVE
Ketones, ur: NEGATIVE mg/dL
Nitrite: NEGATIVE
Protein, ur: NEGATIVE mg/dL
Specific Gravity, Urine: 1.017 (ref 1.005–1.030)
pH: 6 (ref 5.0–8.0)

## 2023-07-15 MED ORDER — ACETAMINOPHEN-CAFFEINE 500-65 MG PO TABS
2.0000 | ORAL_TABLET | Freq: Once | ORAL | Status: AC
  Administered 2023-07-15: 2 via ORAL
  Filled 2023-07-15: qty 2

## 2023-07-15 MED ORDER — ONDANSETRON 4 MG PO TBDP
8.0000 mg | ORAL_TABLET | Freq: Once | ORAL | Status: AC
  Administered 2023-07-15: 8 mg via ORAL
  Filled 2023-07-15: qty 2

## 2023-07-15 NOTE — MAU Provider Note (Signed)
History     CSN: [id number]  Arrival date and time: 07/15/23 2056   None     No chief complaint on file.   Lori Lloyd is a 28 y.o. [id number] at [redacted]w[redacted]d who receives care at [facility].  She presents today for nausea/vomiting and HA.  Patient states she had two incidents of vomiting today: once at 3pm and again at 6pm.  She states after the 2nd incident she noted a HA on her left side that "feels like a migraine."  She states the pain is a 6/10.  Patient endorses fetal movement and denies abdominal cramping/contractions.  She states she has not taken anything for her symptoms.     OB History     Gravida  6   Para  2   Term  1   Preterm  1   AB  3   Living  2      SAB  0   IAB  3   Ectopic  0   Multiple  0   Live Births  2           Past Medical History:  Diagnosis Date   Asthma    Depression    Eczema    Fracture    ring finger right hand    Past Surgical History:  Procedure Laterality Date   CESAREAN SECTION N/A 06/26/2016   Procedure: CESAREAN SECTION;  Surgeon: Lesly Dukes, MD;  Location: [facility]IRTHING SU[facility];  Service: Obstetrics;  Laterality: N/A;   INDUCED ABORTION     WISDOM TOOTH EXTRACTION      Family History  Problem Relation Age of Onset   Diabetes Maternal Grandmother    Hypertension Maternal Grandmother    Hearing loss Neg Hx     Social History   Tobacco Use   Smoking status: Every Day    Current packs/day: 0.25    Average packs/day: 0.3 packs/day for 0.5 years (0.1 ttl pk-yrs)    Types: Cigarettes    Passive exposure: Never   Smokeless tobacco: Never   Tobacco comments:    with preg  Vaping Use   Vaping status: Never Used  Substance Use Topics   Alcohol use: No   Drug use: No    Types: Marijuana    Comment: prior to preg    Allergies:  Allergies  Allergen Reactions   Shellfish Allergy Anaphylaxis    Medications Prior to Admission  Medication Sig Dispense Refill Last Dose/Taking   Blood Pressure Monitoring  (BLOOD PRESSURE KIT) DEVI 1 kit by Does not apply route once a week. Check Blood Pressure regularly and record readings into the Babyscripts App.  Large Cuff.  DX O90.0 1 each 0    loratadine (CLARITIN) 10 MG tablet Take 1 tablet (10 mg total) by mouth daily. (Patient not taking: Reported on 07/14/2023) 30 tablet 0    Misc. Devices (GOJJI WEIGHT SCALE) MISC 1 Device by Does not apply route once a week. 1 each 0    ondansetron (ZOFRAN) 4 MG tablet Take 1 tablet (4 mg total) by mouth every 6 (six) hours. 12 tablet 0    Prenatal Vit-Fe Fumarate-FA (PRENATAL MULTIVITAMIN) TABS tablet Take 1 tablet by mouth daily at 12 noon. 30 tablet 12     Review of Systems  Constitutional:  Negative for chills and fever.  Gastrointestinal:  Positive for nausea and vomiting. Negative for abdominal pain.  Genitourinary:  Negative for difficulty urinating, dysuria, vaginal bleeding and vaginal discharge.  Neurological:  Positive for headaches. Negative for dizziness and light-headedness.   Physical Exam   Blood pressure 107/65, pulse 76, temperature 98.5 F (36.9 C), temperature source Oral, resp. rate 17, height 5\' 1"  (1.549 m), weight 69.4 kg, last menstrual period 02/16/2023, SpO2 100%, unknown if currently breastfeeding.  Physical Exam Vitals reviewed. Exam conducted with a chaperone present Darlina Rumpf, RN).  Constitutional:      Appearance: Normal appearance.  HENT:     Head: Normocephalic and atraumatic.  Eyes:     Conjunctiva/sclera: Conjunctivae normal.  Cardiovascular:     Rate and Rhythm: Normal rate.  Pulmonary:     Effort: Pulmonary effort is normal.  Musculoskeletal:        General: Normal range of motion.     Cervical back: Normal range of motion.  Neurological:     Mental Status: She is alert and oriented to person, place, and time.  Psychiatric:        Mood and Affect: Mood normal.        Behavior: Behavior normal.     MAU Course  Procedures Results for orders placed or performed  during the hospital encounter of 07/15/23 (from the past 24 hours)  Urinalysis, Routine w reflex microscopic -Urine, Clean Catch     Status: Abnormal   Collection Time: 07/15/23  9:32 PM  Result Value Ref Range   Color, Urine YELLOW YELLOW   APPearance CLOUDY (A) CLEAR   Specific Gravity, Urine 1.017 1.005 - 1.030   pH 6.0 5.0 - 8.0   Glucose, UA NEGATIVE NEGATIVE mg/dL   Hgb urine dipstick NEGATIVE NEGATIVE   Bilirubin Urine NEGATIVE NEGATIVE   Ketones, ur NEGATIVE NEGATIVE mg/dL   Protein, ur NEGATIVE NEGATIVE mg/dL   Nitrite NEGATIVE NEGATIVE   Leukocytes,Ua TRACE (A) NEGATIVE   RBC / HPF 0-5 0 - 5 RBC/hpf   WBC, UA 0-5 0 - 5 WBC/hpf   Bacteria, UA RARE (A) NONE SEEN   Squamous Epithelial / HPF 21-50 0 - 5 /HPF   Mucus PRESENT     MDM Antiemetic Analgesic  Assessment and Plan  28 year old, [id number]  SIUP at 21.2 weeks Nausea/Vomiting Headache  -Reviewed POC with patient. -Exam performed.  -Discussed usage of Zofran and Excedrin for symptoms. -Patient agreeable.  -Also instructed to leave urine.   Cherre Robins 07/15/2023, 9:23 PM   Reassessment (10:22 PM) -Patient reports resolution of HA. -Reviewed UA. -Precautions Given.  -Encouraged to call primary office or return to MAU if symptoms worsen or with the onset of new symptoms. -Discharged to home in improved condition.  Cherre Robins MSN, CNM Advanced Practice Provider, [facility] [employer]

## 2023-07-15 NOTE — MAU Note (Signed)
..  Lori Lloyd is a 28 y.o. at [redacted]w[redacted]d here in MAU reporting: vomiting and a migraine  Denies abdominal pain, only has discomfort. +FM. Denies vaginal bleeding or leaking of fluid.   Pain score: 6/10 Vitals:   07/15/23 2117  BP: 107/65  Pulse: 76  Resp: 17  Temp: 98.5 F (36.9 C)  SpO2: 100%     FHT:150 Lab orders placed from triage: UA

## 2023-07-16 ENCOUNTER — Telehealth: Payer: Self-pay

## 2023-07-16 NOTE — Telephone Encounter (Signed)
Spoke with patient and schedule a bh consult per a referral from [facility].

## 2023-07-20 ENCOUNTER — Ambulatory Visit: Payer: Self-pay | Admitting: Licensed Clinical Social Worker

## 2023-07-20 NOTE — BH Specialist Note (Signed)
Patient no showed for todays visit. A virtual link was sent and patient was contacted. Appointment rescheduled for 07/27/2023.

## 2023-07-26 ENCOUNTER — Encounter: Payer: Medicaid Other | Admitting: Licensed Clinical Social Worker

## 2023-07-26 DIAGNOSIS — O09892 Supervision of other high risk pregnancies, second trimester: Secondary | ICD-10-CM

## 2023-07-26 DIAGNOSIS — O99330 Smoking (tobacco) complicating pregnancy, unspecified trimester: Secondary | ICD-10-CM | POA: Insufficient documentation

## 2023-07-26 DIAGNOSIS — F1290 Cannabis use, unspecified, uncomplicated: Secondary | ICD-10-CM | POA: Insufficient documentation

## 2023-07-26 HISTORY — DX: Supervision of other high risk pregnancies, second trimester: O09.892

## 2023-07-27 ENCOUNTER — Encounter: Payer: Medicaid Other | Admitting: Licensed Clinical Social Worker

## 2023-07-27 ENCOUNTER — Ambulatory Visit: Payer: Medicaid Other | Admitting: Licensed Clinical Social Worker

## 2023-07-27 DIAGNOSIS — F4323 Adjustment disorder with mixed anxiety and depressed mood: Secondary | ICD-10-CM

## 2023-07-27 NOTE — BH Specialist Note (Signed)
 Integrated Behavioral Health via Telemedicine Visit  08/01/2023 GISELL BUEHRLE [id number]  Number of Integrated Behavioral Health Clinician visits: 1- Initial Visit  Session Start time: 1515   Session End time: 1600  Total time in minutes: 45   Referring Provider: Harlene Perkins Patient/Family location: At [facility] Provider location: Remote Office  All persons participating in visit: Patient and [facility] Types of Service: Individual psychotherapy and Video visit  I connected with Breah A Patnode and/or Enma A Kurkowski's patient via  Psychologist, Clinical  (Video is Surveyor, mining) and verified that I am speaking with the correct person using two identifiers. Discussed confidentiality: Yes   I discussed the limitations of telemedicine and the availability of in person appointments.  Discussed there is a possibility of technology failure and discussed alternative modes of communication if that failure occurs.  I discussed that engaging in this telemedicine visit, they consent to the provision of behavioral healthcare and the services will be billed under their insurance.  Patient and/or legal guardian expressed understanding and consented to Telemedicine visit: Yes   Presenting Concerns: Patient and/or family reports the following symptoms/concerns: Increased anxiety and depressive symptoms.  Duration of problem: Months; Severity of problem: moderate  Patient and/or Family's Strengths/Protective Factors: Social and Emotional competence, Concrete supports in place (healthy food, safe environments, etc.), Caregiver has knowledge of parenting & child development, and Parental Resilience  Goals Addressed: Patient will:  Reduce symptoms of: anxiety and depression   Increase knowledge and/or ability of: coping skills, healthy habits, and self-management skills   Demonstrate ability to: Increase healthy adjustment to current life circumstances and  Increase adequate support systems for patient/family  Progress towards Goals: Ongoing  Interventions: Interventions utilized:  Mindfulness or Management Consultant, Supportive Counseling, Psychoeducation and/or Health Education, and Supportive Reflection Standardized Assessments completed: Not Needed  Patient and/or Family Response: Patient reports having two children and being pregnant with her third, a baby girl. She works full-time at a bank and expressed feelings of disappointment and unpreparedness regarding her current pregnancy, as well as a desire for a more financially stable situation to alleviate stress about her children's needs. Patient reports limited support, which results in her needing to pick up her children from school during her lunch breaks. While she works from home, she finds it difficult and overwhelming when the children are home at times. Patient was able to create a routine for her children during work hours so that she is able to work, encouraging them to eat snacks, complete chores, homework, and play with toys or electronics. Patient reports feeling stressed and sad often but has never been formally diagnosed with a mental health condition. Though she admits to taking sertraline  for one month but did not feel any improvement. Patient denied using coping strategies when feeling overwhelmed or sad. However, she expressed a willingness to engage in self-care, such as taking breaks for walks, deep breathing, journaling, grounding exercises using her senses, or listening to music.  Assessment: Patient currently experiencing stress and sadness related to her current pregnancy, feeling unprepared and financially unstable. She is also overwhelmed by balancing work from home with caring for her children, especially with limited support. Despite these challenges, she is open to exploring self-care strategies to manage her emotions and improve her situation..   Patient may benefit  from continued support of integrated behavioral health services.  Plan: Follow up with behavioral health clinician on : 08/10/2023 Behavioral recommendations: Kyrielle to continue exploring and utilizing self-care strategies,  such as taking breaks for walks, deep breathing, and journaling, to manage stress and emotions. She should also maintain the routine developed for children in session to help balance work and home life more effectively.  Referral(s): Integrated [employer] (In Clinic)  I discussed the assessment and treatment plan with the patient and/or parent/guardian. They were provided an opportunity to ask questions and all were answered. They agreed with the plan and demonstrated an understanding of the instructions.   They were advised to call back or seek an in-person evaluation if the symptoms worsen or if the condition fails to improve as anticipated.  Jervon Ream LITTIE Seats, LCSWA

## 2023-08-02 ENCOUNTER — Other Ambulatory Visit: Payer: Medicaid Other

## 2023-08-02 ENCOUNTER — Ambulatory Visit: Payer: Medicaid Other

## 2023-08-02 DIAGNOSIS — O09899 Supervision of other high risk pregnancies, unspecified trimester: Secondary | ICD-10-CM

## 2023-08-02 DIAGNOSIS — O99330 Smoking (tobacco) complicating pregnancy, unspecified trimester: Secondary | ICD-10-CM

## 2023-08-02 DIAGNOSIS — O09892 Supervision of other high risk pregnancies, second trimester: Secondary | ICD-10-CM

## 2023-08-02 DIAGNOSIS — F1290 Cannabis use, unspecified, uncomplicated: Secondary | ICD-10-CM

## 2023-08-10 ENCOUNTER — Telehealth: Payer: Self-pay

## 2023-08-10 ENCOUNTER — Encounter: Payer: Medicaid Other | Admitting: Licensed Clinical Social Worker

## 2023-08-10 NOTE — Telephone Encounter (Signed)
 Returned call, pt reports that she has been experiencing cold symptoms and woke up this morning with sharp chest pain. Advised pt to be evaluated at hospital, pt agreed.

## 2023-08-11 ENCOUNTER — Encounter: Payer: Self-pay | Admitting: Emergency Medicine

## 2023-08-11 ENCOUNTER — Ambulatory Visit
Admission: EM | Admit: 2023-08-11 | Discharge: 2023-08-11 | Disposition: A | Discharge status: Home / Self Care | Payer: Medicaid Other

## 2023-08-11 ENCOUNTER — Encounter: Payer: Medicaid Other | Admitting: Obstetrics and Gynecology

## 2023-08-11 DIAGNOSIS — Z3A25 25 weeks gestation of pregnancy: Secondary | ICD-10-CM

## 2023-08-11 DIAGNOSIS — J069 Acute upper respiratory infection, unspecified: Secondary | ICD-10-CM

## 2023-08-11 DIAGNOSIS — O99513 Diseases of the respiratory system complicating pregnancy, third trimester: Secondary | ICD-10-CM | POA: Diagnosis not present

## 2023-08-11 NOTE — Discharge Instructions (Signed)

## 2023-08-11 NOTE — ED Triage Notes (Addendum)
 Pt c/o cough, congestion, chest discomfort, not feeling well, and fatigued for 1 week.

## 2023-08-11 NOTE — ED Provider Notes (Signed)
 [facility]    CSN: [id number] Arrival date & time: 08/11/23  1109      History   Chief Complaint Chief Complaint  Patient presents with   Cough    HPI Lori Lloyd is a 28 y.o. female.   Lori Lloyd is a 28 y.o. female presenting for chief complaint of cough, congestion, and generalized fatigue that started approximately 1 week ago. She is currently [redacted] weeks pregnant. No recent sick contacts with similar symptoms to her knowledge. Denies N/V/D, abdominal pain, shortness of breath, palpitations, and chest pain. History of asthma that is well controlled with as needed use of albuterol inhaler, states she has not needed her inhaler during this illness. Denies vaginal bleeding, vaginal discharge, urinary symptoms, flank pain, and decreased fetal movement. Taking OTC medicines with some relief.    Cough   Past Medical History:  Diagnosis Date   Asthma    Depression    Eczema    Fracture    ring finger right hand    Patient Active Problem List   Diagnosis Date Noted   History of preterm delivery, currently pregnant in second trimester 07/26/2023   Marijuana use during pregnancy 07/26/2023   Tobacco use affecting pregnancy, antepartum 07/26/2023   Supervision of other high risk pregnancy, antepartum 07/12/2023   History of cesarean section 06/29/2016   History of placenta previa 06/09/2016   Depressive disorder 02/18/2013    Past Surgical History:  Procedure Laterality Date   CESAREAN SECTION N/A 06/26/2016   Procedure: CESAREAN SECTION;  Surgeon: Lesly Dukes, MD;  Location: [facility] BIRTHING SUITES;  Service: Obstetrics;  Laterality: N/A;   INDUCED ABORTION     WISDOM TOOTH EXTRACTION      OB History     Gravida  6   Para  2   Term  1   Preterm  1   AB  3   Living  2      SAB  0   IAB  3   Ectopic  0   Multiple  0   Live Births  2            Home Medications    Prior to Admission medications   Medication Sig Start  Date End Date Taking? Authorizing Provider  sertraline (ZOLOFT) 25 MG tablet Take 25 mg by mouth every morning. 08/09/23  Yes [provider]  Blood Pressure Monitoring (BLOOD PRESSURE KIT) DEVI 1 kit by Does not apply route once a week. Check Blood Pressure regularly and record readings into the Babys[employer] App.  Large Cuff.  DX O90.0 07/12/23   Warden Fillers, MD  Misc. Devices (GOJJI WEIGHT SCALE) MISC 1 Device by Does not apply route once a week. 07/12/23   Warden Fillers, MD  ondansetron (ZOFRAN) 4 MG tablet Take 1 tablet (4 mg total) by mouth every 6 (six) hours. 03/22/23   Roemhildt, Lorin T, PA-C  Prenatal Vit-Fe Fumarate-FA (PRENATAL MULTIVITAMIN) TABS tablet Take 1 tablet by mouth daily at 12 noon. 06/09/16   Hermina Staggers, MD    Family History Family History  Problem Relation Age of Onset   Diabetes Maternal Grandmother    Hypertension Maternal Grandmother    Hearing loss Neg Hx     Social History Social History   Tobacco Use   Smoking status: Every Day    Current packs/day: 0.25    Average packs/day: 0.3 packs/day for 0.5 years (0.1 ttl pk-yrs)    Types: Cigarettes  Passive exposure: Never   Smokeless tobacco: Never   Tobacco comments:    with preg  Vaping Use   Vaping status: Never Used  Substance Use Topics   Alcohol use: No   Drug use: No    Types: Marijuana    Comment: prior to preg     Allergies   Shellfish allergy   Review of Systems Review of Systems  Respiratory:  Positive for cough.   Per HPI   Physical Exam Triage Vital Signs ED Triage Vitals  Encounter Vitals Group     BP 08/11/23 1236 103/67     Systolic BP Percentile --      Diastolic BP Percentile --      Pulse Rate 08/11/23 1236 98     Resp 08/11/23 1231 17     Temp 08/11/23 1236 97.8 F (36.6 C)     Temp Source 08/11/23 1231 Oral     SpO2 08/11/23 1236 97 %     Weight --      Height --      Head Circumference --      Peak Flow --      Pain Score 08/11/23 1234 4      Pain Loc --      Pain Education --      Exclude from Growth Chart --    No data found.  Updated Vital Signs BP 103/67 (BP Location: Right Arm)   Pulse 98   Temp 97.8 F (36.6 C) (Oral)   Resp 17   LMP 02/16/2023 (Approximate)   SpO2 97%   Visual Acuity Right Eye Distance:   Left Eye Distance:   Bilateral Distance:    Right Eye Near:   Left Eye Near:    Bilateral Near:     Physical Exam Vitals and nursing note reviewed.  Constitutional:      Appearance: She is not ill-appearing or toxic-appearing.  HENT:     Head: Normocephalic and atraumatic.     Right Ear: Hearing, tympanic membrane, ear canal and external ear normal.     Left Ear: Hearing, tympanic membrane, ear canal and external ear normal.     Nose: Nose normal.     Mouth/Throat:     Lips: Pink.     Mouth: Mucous membranes are moist. No injury or oral lesions.     Dentition: Normal dentition.     Tongue: No lesions.     Pharynx: Oropharynx is clear. Uvula midline. No pharyngeal swelling, oropharyngeal exudate, posterior oropharyngeal erythema, uvula swelling or postnasal drip.     Tonsils: No tonsillar exudate.  Eyes:     General: Lids are normal. Vision grossly intact. Gaze aligned appropriately.     Extraocular Movements: Extraocular movements intact.     Conjunctiva/sclera: Conjunctivae normal.  Neck:     Trachea: Trachea and phonation normal.  Cardiovascular:     Rate and Rhythm: Normal rate and regular rhythm.     Heart sounds: Normal heart sounds, S1 normal and S2 normal.  Pulmonary:     Effort: Pulmonary effort is normal. No respiratory distress.     Breath sounds: Normal breath sounds and air entry. No wheezing, rhonchi or rales.  Chest:     Chest wall: No tenderness.  Abdominal:     Comments: Gravid abdomen.   Musculoskeletal:     Cervical back: Neck supple.  Lymphadenopathy:     Cervical: No cervical adenopathy.  Skin:    General: Skin is warm and dry.  Capillary Refill: Capillary  refill takes less than 2 seconds.     Findings: No rash.  Neurological:     General: No focal deficit present.     Mental Status: She is alert and oriented to person, place, and time. Mental status is at baseline.     Cranial Nerves: No dysarthria or facial asymmetry.  Psychiatric:        Mood and Affect: Mood normal.        Speech: Speech normal.        Behavior: Behavior normal.        Thought Content: Thought content normal.        Judgment: Judgment normal.      UC Treatments / Results  Labs (all labs ordered are listed, but only abnormal results are displayed) Labs Reviewed - No data to display  EKG   Radiology No results found.  Procedures Procedures (including critical care time)  Medications Ordered in UC Medications - No data to display  Initial Impression / Assessment and Plan / UC Course  I have reviewed the triage vital signs and the nursing notes.  Pertinent labs & imaging results that were available during my care of the patient were reviewed by me and considered in my medical decision making (see chart for details).   1. Viral URI with cough Suspect viral URI, viral syndrome.  Strep/viral testing: deferred given timing of illness. List of medications safe in pregnancy provided.   Physical exam findings reassuring, vital signs hemodynamically stable, lungs clear. Patient pregnant and not a candidate for chest x-ray, though low suspicion for acute cardiopulmonary abnormality.  Advised supportive care/prescriptions for symptomatic relief as outlined in AVS.    Counseled patient on potential for adverse effects with medications prescribed/recommended today, strict ER and return-to-clinic precautions discussed, patient verbalized understanding.    Final Clinical Impressions(s) / UC Diagnoses   Final diagnoses:  Viral URI with cough  Diseases of the respiratory system complicating pregnancy, third trimester  [redacted] weeks gestation of pregnancy      Discharge Instructions      You have a viral illness which will improve on its own with rest, fluids, and medications to help with your symptoms.  Tylenol, guaifenesin (plain mucinex), and saline nasal sprays may help relieve symptoms.   Two teaspoons of honey in 1 cup of warm water every 4-6 hours may help with throat pains.  Humidifier in room at nighttime may help soothe cough (clean well daily).   For chest pain, shortness of breath, inability to keep food or fluids down without vomiting, fever that does not respond to tylenol or motrin, or any other severe symptoms, please go to the ER for further evaluation. Return to urgent care as needed, otherwise follow-up with PCP.      ED Prescriptions   None    PDMP not reviewed this encounter.   Carlisle Beers, [place] 08/11/23 2227

## 2023-08-17 ENCOUNTER — Ambulatory Visit (INDEPENDENT_AMBULATORY_CARE_PROVIDER_SITE_OTHER): Payer: Medicaid Other | Admitting: Obstetrics & Gynecology

## 2023-08-17 VITALS — BP 92/61 | HR 94 | Wt 168.0 lb

## 2023-08-17 DIAGNOSIS — O09892 Supervision of other high risk pregnancies, second trimester: Secondary | ICD-10-CM

## 2023-08-17 DIAGNOSIS — O09899 Supervision of other high risk pregnancies, unspecified trimester: Secondary | ICD-10-CM

## 2023-08-17 DIAGNOSIS — Z98891 History of uterine scar from previous surgery: Secondary | ICD-10-CM | POA: Diagnosis not present

## 2023-08-17 NOTE — Progress Notes (Signed)
   PRENATAL VISIT NOTE  Subjective:  Lori Lloyd is a 28 y.o. [id number] at [redacted]w[redacted]d being seen today for ongoing prenatal care.  She is currently monitored for the following issues for this low-risk pregnancy and has Depressive disorder; History of placenta previa; History of cesarean section; Supervision of other high risk pregnancy, antepartum; History of preterm delivery, currently pregnant in second trimester; Marijuana use during pregnancy; and Tobacco use affecting pregnancy, antepartum on their problem list.  Patient reports no complaints.  Contractions: Not present. Vag. Bleeding: None.  Movement: Present. Denies leaking of fluid.   The following portions of the patient's history were reviewed and updated as appropriate: allergies, current medications, past family history, past medical history, past social history, past surgical history and problem list.   Objective:   Vitals:   08/17/23 0913  BP: 92/61  Pulse: 94  Weight: 168 lb (76.2 kg)    Fetal Status: Fetal Heart Rate (bpm): 148   Movement: Present     General:  Alert, oriented and cooperative. Patient is in no acute distress.  Skin: Skin is warm and dry. No rash noted.   Cardiovascular: Normal heart rate noted  Respiratory: Normal respiratory effort, no problems with respiration noted  Abdomen: Soft, gravid, appropriate for gestational age.  Pain/Pressure: Absent     Pelvic: Cervical exam deferred        Extremities: Normal range of motion.  Edema: Trace  Mental Status: Normal mood and affect. Normal behavior. Normal judgment and thought content.   Assessment and Plan:  Pregnancy: [id number] at [redacted]w[redacted]d 1. Supervision of other high risk pregnancy, antepartum (Primary) Needs anatomy scan  2. History of cesarean section   3. History of preterm delivery, currently pregnant in second trimester   Preterm labor symptoms and general obstetric precautions including but not limited to vaginal bleeding, contractions, leaking of  fluid and fetal movement were reviewed in detail with the patient. Please refer to After Visit Summary for other counseling recommendations.   Return in about 2 weeks (around 08/31/2023).  No future appointments.  Scheryl Darter, MD

## 2023-08-17 NOTE — Progress Notes (Signed)
 ROB, wants to know about her [place].

## 2023-08-22 NOTE — BH Specialist Note (Unsigned)
 Pt did not arrive to video visit and did not answer the phone; left MyChart message for patient;  Unable to leave message as mailbox is full.

## 2023-08-23 ENCOUNTER — Encounter: Payer: Self-pay | Admitting: Obstetrics & Gynecology

## 2023-08-24 ENCOUNTER — Other Ambulatory Visit (HOSPITAL_COMMUNITY): Payer: Self-pay

## 2023-08-24 ENCOUNTER — Inpatient Hospital Stay (HOSPITAL_COMMUNITY)
Admission: AD | Admit: 2023-08-24 | Discharge: 2023-08-24 | Disposition: A | Discharge status: Home / Self Care | Attending: Obstetrics and Gynecology | Admitting: Obstetrics and Gynecology

## 2023-08-24 ENCOUNTER — Encounter (HOSPITAL_COMMUNITY): Payer: Self-pay | Admitting: Obstetrics and Gynecology

## 2023-08-24 DIAGNOSIS — O26893 Other specified pregnancy related conditions, third trimester: Secondary | ICD-10-CM

## 2023-08-24 DIAGNOSIS — R109 Unspecified abdominal pain: Secondary | ICD-10-CM | POA: Diagnosis not present

## 2023-08-24 DIAGNOSIS — O99612 Diseases of the digestive system complicating pregnancy, second trimester: Secondary | ICD-10-CM | POA: Diagnosis not present

## 2023-08-24 DIAGNOSIS — Z3689 Encounter for other specified antenatal screening: Secondary | ICD-10-CM

## 2023-08-24 DIAGNOSIS — K5900 Constipation, unspecified: Secondary | ICD-10-CM | POA: Insufficient documentation

## 2023-08-24 DIAGNOSIS — O26892 Other specified pregnancy related conditions, second trimester: Secondary | ICD-10-CM

## 2023-08-24 DIAGNOSIS — O09899 Supervision of other high risk pregnancies, unspecified trimester: Secondary | ICD-10-CM

## 2023-08-24 DIAGNOSIS — Z3A27 27 weeks gestation of pregnancy: Secondary | ICD-10-CM | POA: Insufficient documentation

## 2023-08-24 LAB — URINALYSIS, ROUTINE W REFLEX MICROSCOPIC
Bilirubin Urine: NEGATIVE
Glucose, UA: NEGATIVE mg/dL
Hgb urine dipstick: NEGATIVE
Ketones, ur: NEGATIVE mg/dL
Leukocytes,Ua: NEGATIVE
Nitrite: NEGATIVE
Protein, ur: NEGATIVE mg/dL
Specific Gravity, Urine: 1.013 (ref 1.005–1.030)
pH: 8 (ref 5.0–8.0)

## 2023-08-24 LAB — WET PREP, GENITAL
Sperm: NONE SEEN
Trich, Wet Prep: NONE SEEN
WBC, Wet Prep HPF POC: >=10 — AB (ref <10)

## 2023-08-24 MED ORDER — METRONIDAZOLE 500 MG PO TABS: 500.0000 mg | ORAL_TABLET | Freq: Two times a day (BID) | ORAL | 0 refills | Status: DC

## 2023-08-24 MED ORDER — DOCUSATE SODIUM 100 MG PO CAPS: 100.0000 mg | ORAL_CAPSULE | Freq: Two times a day (BID) | ORAL | 0 refills | Status: DC

## 2023-08-24 MED ORDER — TERCONAZOLE 0.4 % VA CREA: 1.0000 | TOPICAL_CREAM | Freq: Every day | VAGINAL | 0 refills | Status: DC

## 2023-08-24 NOTE — Discharge Instructions (Signed)

## 2023-08-24 NOTE — MAU Provider Note (Signed)
 History     CSN: [id number]  Arrival date and time: 08/24/23 0750   Event Date/Time   First Provider Initiated Contact with Patient 08/24/23 [phone]      Chief Complaint  Patient presents with   Abdominal Pain   Lori Lloyd is a 28 y.o. [id number] at [redacted]w[redacted]d who receives care at [facility].  She presents today for abdominal pain.  Patient states she woke around 10pm to stomach pain that "felt like cramps." She is unsure if it was contractions because she has never have experienced them before.  She questioned if it was [facility] ctx, but reports it lasted 30 minutes continuously.  She states prior to going to sleep she was also having some rectal pain.  She endorses some mild constipation.  Patient denies current pain, vaginal bleeding, or discharge. No recent sexual activity.  OB History     Gravida  6   Para  2   Term  1   Preterm  1   AB  3   Living  2      SAB  0   IAB  3   Ectopic  0   Multiple  0   Live Births  2           Past Medical History:  Diagnosis Date   Asthma    Depression    Eczema    Fracture    ring finger right hand    Past Surgical History:  Procedure Laterality Date   CESAREAN SECTION N/A 06/26/2016   Procedure: CESAREAN SECTION;  Surgeon: Lesly Dukes, MD;  Location: [facility] BIRTHING SUITES;  Service: Obstetrics;  Laterality: N/A;   INDUCED ABORTION     WISDOM TOOTH EXTRACTION      Family History  Problem Relation Age of Onset   Diabetes Maternal Grandmother    Hypertension Maternal Grandmother    Hearing loss Neg Hx     Social History   Tobacco Use   Smoking status: Every Day    Current packs/day: 0.25    Average packs/day: 0.3 packs/day for 0.5 years (0.1 ttl pk-yrs)    Types: Cigarettes    Passive exposure: Never   Smokeless tobacco: Never   Tobacco comments:    with preg  Vaping Use   Vaping status: Never Used  Substance Use Topics   Alcohol use: No   Drug use: No    Types: Marijuana    Comment: prior to preg     Allergies:  Allergies  Allergen Reactions   Shellfish Allergy Anaphylaxis    Medications Prior to Admission  Medication Sig Dispense Refill Last Dose/Taking   Blood Pressure Monitoring (BLOOD PRESSURE KIT) DEVI 1 kit by Does not apply route once a week. Check Blood Pressure regularly and record readings into the Babys[employer]s App.  Large Cuff.  DX O90.0 1 each 0    Misc. Devices (GOJJI WEIGHT SCALE) MISC 1 Device by Does not apply route once a week. 1 each 0    ondansetron (ZOFRAN) 4 MG tablet Take 1 tablet (4 mg total) by mouth every 6 (six) hours. 12 tablet 0    Prenatal Vit-Fe Fumarate-FA (PRENATAL MULTIVITAMIN) TABS tablet Take 1 tablet by mouth daily at 12 noon. 30 tablet 12    sertraline (ZOLOFT) 25 MG tablet Take 25 mg by mouth every morning.       Review of Systems  Gastrointestinal:  Positive for abdominal pain (None currently) and constipation. Negative for diarrhea, nausea and vomiting.  Genitourinary:  Negative for difficulty urinating, dysuria, vaginal bleeding and vaginal discharge.   Physical Exam   Blood pressure 108/62, pulse 89, temperature 98.3 F (36.8 C), temperature source Oral, resp. rate 14, weight 77.2 kg, last menstrual period 02/16/2023, SpO2 100%, unknown if currently breastfeeding.  Physical Exam Vitals and nursing note reviewed. Exam conducted with a chaperone present Delfino Lovett, RN).  Constitutional:      Appearance: She is well-developed.  HENT:     Head: Normocephalic and atraumatic.  Eyes:     Conjunctiva/sclera: Conjunctivae normal.  Cardiovascular:     Rate and Rhythm: Normal rate.  Pulmonary:     Effort: Pulmonary effort is normal. No respiratory distress.  Abdominal:     Palpations: Abdomen is soft.     Tenderness: There is no abdominal tenderness.  Genitourinary:    General: Normal vulva.     Comments: Cultures collected blindly. Dilation: Closed Exam by:: Sabas Sous CNM  Musculoskeletal:        General: Normal range of motion.      Cervical back: Normal range of motion.  Skin:    General: Skin is warm and dry.  Neurological:     Mental Status: She is alert and oriented to person, place, and time.  Psychiatric:        Mood and Affect: Mood normal.        Behavior: Behavior normal.     Fetal Assessment 145 bpm, Mod Var, -Decels, +15x15 Accels Toco: No ctx graphed  MAU Course   Results for orders placed or performed during the hospital encounter of 08/24/23 (from the past 24 hours)  Urinalysis, Routine w reflex microscopic -Urine, Clean Catch     Status: Abnormal   Collection Time: 08/24/23  8:05 AM  Result Value Ref Range   Color, Urine YELLOW YELLOW   APPearance HAZY (A) CLEAR   Specific Gravity, Urine 1.013 1.005 - 1.030   pH 8.0 5.0 - 8.0   Glucose, UA NEGATIVE NEGATIVE mg/dL   Hgb urine dipstick NEGATIVE NEGATIVE   Bilirubin Urine NEGATIVE NEGATIVE   Ketones, ur NEGATIVE NEGATIVE mg/dL   Protein, ur NEGATIVE NEGATIVE mg/dL   Nitrite NEGATIVE NEGATIVE   Leukocytes,Ua NEGATIVE NEGATIVE   No results found.  MDM PE Labs: UA,  Cultures: Wet Prep, GC/CT EFM Prescription Assessment and Plan  28 year old [id number]  SIUP at 27 weeks Cat I FT Abdominal Pain-Resolved Constipation  -POC discussed. -Exam performed and findings discussed. -Discussed possible causes of pain including indigestion, constipation, and fetal movement. -Encouraged to increase fiber and water in diet as well as usage of stool softener. -Patient agreeable to vaginal swabs. -Cervical exam as above.  -Informed that once wet prep returns will plan to discharge home.  -Patient requests discharge now with results via mychart. -Provider agreeable. -Rx for colace sent to pharmacy on file.  -NST reactive. -Encouraged to call primary office or return to MAU if symptoms worsen or with the onset of new symptoms. -Discharged to home in stable condition.  Cherre Robins MSN, CNM 08/24/2023, 8:58 AM

## 2023-08-24 NOTE — MAU Note (Signed)
..  Lori Lloyd is a 28 y.o. at [redacted]w[redacted]d here in MAU reporting: woke up in the middle of the night with intense abdominal pain, unsure if it was contractions or not. She called the on call nurse and they told her to come in but she has 4 children and had to wait until she got them to school before she could come in. The pain is now gone. Denies VB or LOF. +FM. No recent IC.   Pain score: 0 Vitals:   08/24/23 0807  BP: 108/62  Pulse: 89  Resp: 14  Temp: 98.3 F (36.8 C)  SpO2: 100%     FHT:140 Lab orders placed from triage:   UA

## 2023-08-25 LAB — GC/CHLAMYDIA PROBE AMP (~~LOC~~) NOT AT ARMC
Chlamydia: NEGATIVE
Comment: NEGATIVE
Comment: NORMAL
Neisseria Gonorrhea: NEGATIVE

## 2023-08-31 ENCOUNTER — Other Ambulatory Visit: Payer: Medicaid Other

## 2023-08-31 ENCOUNTER — Encounter: Payer: Medicaid Other | Admitting: Physician Assistant

## 2023-08-31 ENCOUNTER — Ambulatory Visit (INDEPENDENT_AMBULATORY_CARE_PROVIDER_SITE_OTHER): Payer: Medicaid Other | Admitting: Obstetrics and Gynecology

## 2023-08-31 VITALS — BP 98/66 | HR 91 | Wt 172.0 lb

## 2023-08-31 DIAGNOSIS — O99320 Drug use complicating pregnancy, unspecified trimester: Secondary | ICD-10-CM

## 2023-08-31 DIAGNOSIS — Z8759 Personal history of other complications of pregnancy, childbirth and the puerperium: Secondary | ICD-10-CM

## 2023-08-31 DIAGNOSIS — Z3A28 28 weeks gestation of pregnancy: Secondary | ICD-10-CM

## 2023-08-31 DIAGNOSIS — Z98891 History of uterine scar from previous surgery: Secondary | ICD-10-CM

## 2023-08-31 DIAGNOSIS — O09892 Supervision of other high risk pregnancies, second trimester: Secondary | ICD-10-CM

## 2023-08-31 DIAGNOSIS — O09899 Supervision of other high risk pregnancies, unspecified trimester: Secondary | ICD-10-CM | POA: Diagnosis not present

## 2023-08-31 DIAGNOSIS — F1290 Cannabis use, unspecified, uncomplicated: Secondary | ICD-10-CM

## 2023-08-31 NOTE — Progress Notes (Signed)
 Pt has not had anatomy scan - has ordered needs to be scheduled at check out.

## 2023-08-31 NOTE — Progress Notes (Signed)
   PRENATAL VISIT NOTE  Subjective:  Lori Lloyd is a 28 y.o. [id number] at [redacted]w[redacted]d being seen today for ongoing prenatal care.  She is currently monitored for the following issues for this high-risk pregnancy and has Depressive disorder; History of placenta previa; History of cesarean section; Supervision of other high risk pregnancy, antepartum; History of preterm delivery, currently pregnant in second trimester; Marijuana use during pregnancy; and Tobacco use affecting pregnancy, antepartum on their problem list.  Patient doing well with no acute concerns today. She reports no complaints.  Contractions: Not present. Vag. Bleeding: None.  Movement: Present. Denies leaking of fluid.   The following portions of the patient's history were reviewed and updated as appropriate: allergies, current medications, past family history, past medical history, past social history, past surgical history and problem list. Problem list updated.  Objective:   Vitals:   08/31/23 1012  BP: 98/66  Pulse: 91  Weight: 172 lb (78 kg)    Fetal Status: Fetal Heart Rate (bpm): 145 Fundal Height: 28 cm Movement: Present     General:  Alert, oriented and cooperative. Patient is in no acute distress.  Skin: Skin is warm and dry. No rash noted.   Cardiovascular: Normal heart rate noted  Respiratory: Normal respiratory effort, no problems with respiration noted  Abdomen: Soft, gravid, appropriate for gestational age.  Pain/Pressure: Absent     Pelvic: Cervical exam deferred        Extremities: Normal range of motion.     Mental Status:  Normal mood and affect. Normal behavior. Normal judgment and thought content.   Assessment and Plan:  Pregnancy: [id number] at [redacted]w[redacted]d  1. Supervision of other high risk pregnancy, antepartum (Primary) Continue routine prenatal care - Glucose Tolerance, 2 Hours w/1 Hour - RPR - CBC - HIV antibody (with reflex)  2. [redacted] weeks gestation of pregnancy  - Glucose Tolerance, 2 Hours  w/1 Hour - RPR - CBC - HIV antibody (with reflex)  3. Marijuana use during pregnancy   4. History of preterm delivery, currently pregnant in second trimester No s/sx of preterm labor  5. History of placenta previa Not verified in this pregnancy, anatomy scan is pending  6. History of cesarean section Op note found in chart, LTCS confirmed Info given regarding VBAC vs repeat c section Pt to hopefully decide at next visit  Preterm labor symptoms and general obstetric precautions including but not limited to vaginal bleeding, contractions, leaking of fluid and fetal movement were reviewed in detail with the patient.  Please refer to After Visit Summary for other counseling recommendations.   Return in about 2 weeks (around 09/14/2023) for ROB, in person.   Mariel Aloe, MD Faculty Attending Center for [facility]

## 2023-09-01 LAB — CBC
Hematocrit: 28 % — ABNORMAL LOW (ref 34.0–46.6)
Hemoglobin: 9.5 g/dL — ABNORMAL LOW (ref 11.1–15.9)
MCH: 31.3 pg (ref 26.6–33.0)
MCHC: 33.9 g/dL (ref 31.5–35.7)
MCV: 92 fL (ref 79–97)
Platelets: 194 10*3/uL (ref 150–450)
RBC: 3.04 x10E6/uL — ABNORMAL LOW (ref 3.77–5.28)
RDW: 11.8 % (ref 11.7–15.4)
WBC: 8.6 10*3/uL (ref 3.4–10.8)

## 2023-09-01 LAB — RPR: RPR Ser Ql: NONREACTIVE

## 2023-09-01 LAB — GLUCOSE TOLERANCE, 2 HOURS W/ 1HR
Glucose, 1 hour: 177 mg/dL (ref 70–179)
Glucose, 2 hour: 131 mg/dL (ref 70–152)
Glucose, Fasting: 88 mg/dL (ref 70–91)

## 2023-09-01 LAB — HIV ANTIBODY (ROUTINE TESTING W REFLEX): HIV Screen 4th Generation wRfx: NONREACTIVE

## 2023-09-02 ENCOUNTER — Ambulatory Visit: Payer: Medicaid Other | Admitting: Clinical

## 2023-09-14 ENCOUNTER — Ambulatory Visit (INDEPENDENT_AMBULATORY_CARE_PROVIDER_SITE_OTHER): Admitting: Obstetrics and Gynecology

## 2023-09-14 VITALS — BP 109/67 | HR 101 | Wt 178.0 lb

## 2023-09-14 DIAGNOSIS — O09892 Supervision of other high risk pregnancies, second trimester: Secondary | ICD-10-CM | POA: Diagnosis not present

## 2023-09-14 DIAGNOSIS — O99320 Drug use complicating pregnancy, unspecified trimester: Secondary | ICD-10-CM | POA: Diagnosis not present

## 2023-09-14 DIAGNOSIS — Z98891 History of uterine scar from previous surgery: Secondary | ICD-10-CM

## 2023-09-14 DIAGNOSIS — F1290 Cannabis use, unspecified, uncomplicated: Secondary | ICD-10-CM

## 2023-09-14 DIAGNOSIS — O09899 Supervision of other high risk pregnancies, unspecified trimester: Secondary | ICD-10-CM | POA: Diagnosis not present

## 2023-09-14 DIAGNOSIS — Z8759 Personal history of other complications of pregnancy, childbirth and the puerperium: Secondary | ICD-10-CM

## 2023-09-14 NOTE — Progress Notes (Signed)
 Pt presents for ROB visit. No concerns

## 2023-09-14 NOTE — Progress Notes (Signed)
   PRENATAL VISIT NOTE  Subjective:  Lori Lloyd is a 28 y.o. [id number] at [redacted]w[redacted]d being seen today for ongoing prenatal care.  She is currently monitored for the following issues for this high-risk pregnancy and has Depressive disorder; History of placenta previa; History of cesarean section; Supervision of other high risk pregnancy, antepartum; History of preterm delivery, currently pregnant in second trimester; Marijuana use during pregnancy; and Tobacco use affecting pregnancy, antepartum on their problem list.  Patient reports no complaints.  Contractions: Not present.  .  Movement: Present. Denies leaking of fluid.   The following portions of the patient's history were reviewed and updated as appropriate: allergies, current medications, past family history, past medical history, past social history, past surgical history and problem list.   Objective:   Vitals:   09/14/23 0905  BP: 109/67  Pulse: (!) 101  Weight: 178 lb (80.7 kg)    Fetal Status: Fetal Heart Rate (bpm): 147   Movement: Present     General:  Alert, oriented and cooperative. Patient is in no acute distress.  Skin: Skin is warm and dry. No rash noted.   Cardiovascular: Normal heart rate noted  Respiratory: Normal respiratory effort, no problems with respiration noted  Abdomen: Soft, gravid, appropriate for gestational age.  Pain/Pressure: Present     Pelvic: Cervical exam deferred        Extremities: Normal range of motion.  Edema: Trace  Mental Status: Normal mood and affect. Normal behavior. Normal judgment and thought content.   Assessment and Plan:  Pregnancy: [id number] at [redacted]w[redacted]d 1. Supervision of other high risk pregnancy, antepartum (Primary) BP and FHR normal Doing well, feeling regular movement    2. Marijuana use during pregnancy   3. History of preterm delivery, currently pregnant in second trimester No s&s, precautions discussed  4. History of cesarean section Desires TOLAC, discussed with MD  last visit, consent signed today   5. History of placenta previa U/s 3/28  Preterm labor symptoms and general obstetric precautions including but not limited to vaginal bleeding, contractions, leaking of fluid and fetal movement were reviewed in detail with the patient. Please refer to After Visit Summary for other counseling recommendations.   Return in about 2 weeks (around 09/28/2023) for OB VISIT (MD or APP).  Future Appointments  Date Time Provider Department Center  09/16/2023  8:15 AM [facility] NURSE [facility]  09/16/2023  8:30 AM [facility]    Albertine Grates, FNP

## 2023-09-16 ENCOUNTER — Ambulatory Visit

## 2023-09-16 ENCOUNTER — Ambulatory Visit: Admitting: *Deleted

## 2023-09-16 ENCOUNTER — Ambulatory Visit: Attending: Obstetrics | Admitting: Obstetrics

## 2023-09-16 ENCOUNTER — Encounter: Payer: Self-pay | Admitting: *Deleted

## 2023-09-16 ENCOUNTER — Other Ambulatory Visit: Payer: Self-pay | Admitting: *Deleted

## 2023-09-16 VITALS — BP 102/60 | HR 112

## 2023-09-16 DIAGNOSIS — Z8759 Personal history of other complications of pregnancy, childbirth and the puerperium: Secondary | ICD-10-CM

## 2023-09-16 DIAGNOSIS — O09899 Supervision of other high risk pregnancies, unspecified trimester: Secondary | ICD-10-CM | POA: Insufficient documentation

## 2023-09-16 DIAGNOSIS — O99323 Drug use complicating pregnancy, third trimester: Secondary | ICD-10-CM | POA: Diagnosis not present

## 2023-09-16 DIAGNOSIS — Z98891 History of uterine scar from previous surgery: Secondary | ICD-10-CM

## 2023-09-16 DIAGNOSIS — Z362 Encounter for other antenatal screening follow-up: Secondary | ICD-10-CM

## 2023-09-16 DIAGNOSIS — Z3A30 30 weeks gestation of pregnancy: Secondary | ICD-10-CM

## 2023-09-16 DIAGNOSIS — O34219 Maternal care for unspecified type scar from previous cesarean delivery: Secondary | ICD-10-CM | POA: Diagnosis not present

## 2023-09-16 DIAGNOSIS — O09293 Supervision of pregnancy with other poor reproductive or obstetric history, third trimester: Secondary | ICD-10-CM

## 2023-09-16 DIAGNOSIS — O09213 Supervision of pregnancy with history of pre-term labor, third trimester: Secondary | ICD-10-CM

## 2023-09-16 DIAGNOSIS — E669 Obesity, unspecified: Secondary | ICD-10-CM

## 2023-09-16 DIAGNOSIS — O09892 Supervision of other high risk pregnancies, second trimester: Secondary | ICD-10-CM | POA: Diagnosis present

## 2023-09-16 DIAGNOSIS — F1290 Cannabis use, unspecified, uncomplicated: Secondary | ICD-10-CM

## 2023-09-16 NOTE — Progress Notes (Signed)
 MFM Consult Note  Lori Lloyd is currently at 30 weeks and 2 days.  She recently transferred her care for delivery at [facility] from [facility].  Her prior pregnancy in 2018 was complicated by a cesarean delivery at 33 weeks and 5 days due to vaginal bleeding from placenta previa.  Vasa previa was also noted in that pregnancy.  Her first delivery in 2014 was a an uncomplicated vaginal delivery.  She denies any problems in her current pregnancy.    She had a cell free DNA test earlier in her pregnancy which indicated a low risk for trisomy 17, 67, and 13. A female fetus is predicted.   The overall EFW of 3 pounds 12 ounces measures at the 69th percentile for her gestational age.  There was normal amniotic fluid noted.    The views of the fetal anatomy were limited today due to her advanced gestational age.  However, what was visualized today appeared within normal limits.  The patient was informed that anomalies may be missed due to technical limitations. If the fetus is in a suboptimal position or maternal habitus is increased, visualization of the fetus in the maternal uterus may be impaired.  A normal-appearing posterior placenta without any signs of placenta previa was noted today.  The patient was advised that as placenta previa was not noted today, she may attempt a vaginal delivery (TOLAC) at term should she desire.  Due to her past history, a follow-up exam was scheduled in 4 weeks.    The patient stated that all of her questions were answered today.  A total of 30 minutes was spent counseling and coordinating the care for this patient. Greater than 50% of the time was spent in direct face-to-face contact.

## 2023-09-25 NOTE — Progress Notes (Unsigned)
   PRENATAL VISIT NOTE  Subjective:  Lori Lloyd is a 28 y.o. [id number] at [redacted]w[redacted]d being seen today for ongoing prenatal care.  She is currently monitored for the following issues for this high-risk pregnancy and has Depressive disorder; History of placenta previa; History of cesarean section; Supervision of other high risk pregnancy, antepartum; History of preterm delivery, currently pregnant in second trimester; Marijuana use during pregnancy; and Tobacco use affecting pregnancy, antepartum on their problem list.  Patient reports concern for recent depressed mood, previously on Sertraline 25mg  daily for 30 days without improvement. Recent stressors include needing to move homes with two young children and no additional support, pregnancy, and reports recent daily tearfulness. Already connected with personal behavioral health professional.    Contractions: Irritability. Vag. Bleeding: None.  Movement: Present. Denies leaking of fluid.    The following portions of the patient's history were reviewed and updated as appropriate: allergies, current medications, past family history, past medical history, past social history, past surgical history and problem list.   Objective:   Vitals:   09/28/23 0910  BP: 112/65  Pulse: 95  Weight: 181 lb 9.6 oz (82.4 kg)    Fetal Status:   Fundal Height: 32 cm Movement: Present     General:  Alert, oriented and cooperative. Patient is in no acute distress.  Skin: Skin is warm and dry. No rash noted.   Cardiovascular: Normal heart rate noted  Respiratory: Normal respiratory effort, no problems with respiration noted  Abdomen: Soft, gravid, appropriate for gestational age.  Pain/Pressure: Present     Pelvic: Cervical exam deferred        Extremities: Normal range of motion.  Edema: Trace  Mental Status: Normal mood and affect. Normal behavior. Normal judgment and thought content.   Assessment and Plan:  Pregnancy: [id number] at [redacted]w[redacted]d  1. Supervision of  other high risk pregnancy, antepartum (Primary) Patient is doing well, feeling regular fetal movement BP, FHR, FH appropriate  2. [redacted] weeks gestation of pregnancy Anticipatory guidance about next visits/weeks of pregnancy given  3. History of cesarean section G5, 33w due to placenta previa  TOLAC consent signed   4. History of preterm delivery, currently pregnant in second trimester G5, 33 weeks  5. Depressed mood Patient with history of depression on no current medication, increased significant life stressors, daily tearfulness, and positive PHQ-9 and GAD-7 in office (no SI/HI). Per patient, currently seeing behavioral health professional.  We discussed re-starting sertraline 25 mg for depressed mood with possible up-titration depending on need. She was advised regarding 6-8 week response time for improvement, and possible adverse medication effects. Patient is aware of need for close follow-up and monitoring on this therapy. Patient to also speak with social work in office today and continue [facility] consults. Reassessment at upcoming prenatal visits.  - sertraline (ZOLOFT) 25 MG tablet; Take 1 tablet (25 mg total) by mouth every morning.  Dispense: 30 tablet; Refill: 2   Preterm labor symptoms and general obstetric precautions including but not limited to vaginal bleeding, contractions, leaking of fluid and fetal movement were reviewed in detail with the patient.  Please refer to After Visit Summary for other counseling recommendations.   Return in about 2 weeks (around 10/12/2023) for [facility].  Future Appointments  Date Time Provider Department Center  10/14/2023  2:00 PM [facility] PROVIDER 1 [facility]  10/14/2023  2:30 PM [facility] US4 [facility]    Ralene Muskrat, PA-C

## 2023-09-26 ENCOUNTER — Telehealth: Payer: Self-pay

## 2023-09-26 NOTE — Telephone Encounter (Signed)
 Return TC to pt. Pt reported overall not feeling well, has experienced decreased fetal movement over the course of a couple days. Pt does report going through a stressful time. Has tried different things but still reporting decreased fetal movement. Informed pt to report to MAU to be evaluated due to this.

## 2023-09-28 ENCOUNTER — Encounter: Payer: Self-pay | Admitting: Physician Assistant

## 2023-09-28 ENCOUNTER — Ambulatory Visit: Admitting: Physician Assistant

## 2023-09-28 ENCOUNTER — Telehealth: Payer: Self-pay | Admitting: General Practice

## 2023-09-28 VITALS — BP 112/65 | HR 95 | Wt 181.6 lb

## 2023-09-28 DIAGNOSIS — O09899 Supervision of other high risk pregnancies, unspecified trimester: Secondary | ICD-10-CM

## 2023-09-28 DIAGNOSIS — R4589 Other symptoms and signs involving emotional state: Secondary | ICD-10-CM

## 2023-09-28 DIAGNOSIS — O09892 Supervision of other high risk pregnancies, second trimester: Secondary | ICD-10-CM

## 2023-09-28 DIAGNOSIS — Z3A31 31 weeks gestation of pregnancy: Secondary | ICD-10-CM | POA: Diagnosis not present

## 2023-09-28 DIAGNOSIS — Z98891 History of uterine scar from previous surgery: Secondary | ICD-10-CM | POA: Diagnosis not present

## 2023-09-28 MED ORDER — SERTRALINE HCL 25 MG PO TABS: 25.0000 mg | ORAL_TABLET | Freq: Every morning | ORAL | 2 refills | Status: DC

## 2023-09-28 NOTE — Patient Instructions (Signed)
 Take up to 3,000mg  Tylenol for pain in pregnancy

## 2023-09-28 NOTE — Progress Notes (Signed)
 Pt presents for ROB visit. Pt c/o back pain.

## 2023-10-10 NOTE — Progress Notes (Unsigned)
   PRENATAL VISIT NOTE  Subjective:  Lori Lloyd is a 28 y.o. [id number] at [redacted]w[redacted]d being seen today for ongoing prenatal care.  She is currently monitored for the following issues for this high-risk pregnancy and has Depressive disorder; History of placenta previa; History of cesarean section; Supervision of other high risk pregnancy, antepartum; History of preterm delivery, currently pregnant in second trimester; Marijuana use during pregnancy; and Tobacco use affecting pregnancy, antepartum on their problem list.  Patient reports no complaints. She reports mood significantly improved, as she was able to find help to move households. Contractions: Irritability. Vag. Bleeding: None.  Movement: Present. Denies leaking of fluid.   The following portions of the patient's history were reviewed and updated as appropriate: allergies, current medications, past family history, past medical history, past social history, past surgical history and problem list.   Objective:   Vitals:   10/12/23 1007  BP: 112/70  Pulse: 100  Weight: 180 lb 12.8 oz (82 kg)    Fetal Status: Fetal Heart Rate (bpm): 135   Movement: Present     General:  Alert, oriented and cooperative. Patient is in no acute distress.  Skin: Skin is warm and dry. No rash noted.   Cardiovascular: Normal heart rate noted  Respiratory: Normal respiratory effort, no problems with respiration noted  Abdomen: Soft, gravid, appropriate for gestational age.  Pain/Pressure: Present     Pelvic: Cervical exam deferred        Extremities: Normal range of motion.  Edema: Trace (Hands and feet)  Mental Status: Normal mood and affect. Normal behavior. Normal judgment and thought content.   Assessment and Plan:  Pregnancy: [id number] at [redacted]w[redacted]d  1. Supervision of other high risk pregnancy, antepartum (Primary) Patient doing well, feeling regular fetal movement BP, FHR, FH appropriate  2. [redacted] weeks gestation of pregnancy Anticipatory guidance about  next visits/weeks of pregnancy given.  10/14/23  3. History of cesarean section G5, 2018 TOLAC consent signed   4. History of preterm delivery, currently pregnant in third trimester G5, [redacted]w[redacted]d  5. Depressed mood Improved; Has not started sertraline  25 mg prescribed last visit, would like rx sent to updated pharmacy.  - sertraline  (ZOLOFT ) 25 MG tablet; Take 1 tablet (25 mg total) by mouth daily.  Dispense: 30 tablet; Refill: 2   Preterm labor symptoms and general obstetric precautions including but not limited to vaginal bleeding, contractions, leaking of fluid and fetal movement were reviewed in detail with the patient.  Please refer to After Visit Summary for other counseling recommendations.   Return in about 2 weeks (around 10/26/2023) for [facility].  Future Appointments  Date Time Provider Department Center  10/14/2023  2:00 PM [facility] PROVIDER 1 [facility]  10/14/2023  2:30 PM [facility]    Luevenia Saha, PA-C

## 2023-10-12 ENCOUNTER — Ambulatory Visit (INDEPENDENT_AMBULATORY_CARE_PROVIDER_SITE_OTHER): Admitting: Physician Assistant

## 2023-10-12 VITALS — BP 112/70 | HR 100 | Wt 180.8 lb

## 2023-10-12 DIAGNOSIS — Z98891 History of uterine scar from previous surgery: Secondary | ICD-10-CM | POA: Diagnosis not present

## 2023-10-12 DIAGNOSIS — Z3A34 34 weeks gestation of pregnancy: Secondary | ICD-10-CM | POA: Diagnosis not present

## 2023-10-12 DIAGNOSIS — O09893 Supervision of other high risk pregnancies, third trimester: Secondary | ICD-10-CM

## 2023-10-12 DIAGNOSIS — O09899 Supervision of other high risk pregnancies, unspecified trimester: Secondary | ICD-10-CM

## 2023-10-12 DIAGNOSIS — R4589 Other symptoms and signs involving emotional state: Secondary | ICD-10-CM

## 2023-10-12 MED ORDER — SERTRALINE HCL 25 MG PO TABS: 25.0000 mg | ORAL_TABLET | Freq: Every day | ORAL | 2 refills | Status: DC

## 2023-10-12 NOTE — Progress Notes (Signed)
 Pt presents for hob. Pt has no questions or concerns at this time.

## 2023-10-14 ENCOUNTER — Ambulatory Visit: Attending: Obstetrics and Gynecology

## 2023-10-14 ENCOUNTER — Ambulatory Visit (HOSPITAL_BASED_OUTPATIENT_CLINIC_OR_DEPARTMENT_OTHER): Admitting: Obstetrics

## 2023-10-14 DIAGNOSIS — O09892 Supervision of other high risk pregnancies, second trimester: Secondary | ICD-10-CM | POA: Insufficient documentation

## 2023-10-14 DIAGNOSIS — O34219 Maternal care for unspecified type scar from previous cesarean delivery: Secondary | ICD-10-CM | POA: Diagnosis not present

## 2023-10-14 DIAGNOSIS — Z3A34 34 weeks gestation of pregnancy: Secondary | ICD-10-CM | POA: Insufficient documentation

## 2023-10-14 DIAGNOSIS — Z8759 Personal history of other complications of pregnancy, childbirth and the puerperium: Secondary | ICD-10-CM

## 2023-10-14 DIAGNOSIS — F1290 Cannabis use, unspecified, uncomplicated: Secondary | ICD-10-CM | POA: Diagnosis not present

## 2023-10-14 DIAGNOSIS — O99323 Drug use complicating pregnancy, third trimester: Secondary | ICD-10-CM

## 2023-10-14 DIAGNOSIS — O09213 Supervision of pregnancy with history of pre-term labor, third trimester: Secondary | ICD-10-CM

## 2023-10-14 DIAGNOSIS — Z362 Encounter for other antenatal screening follow-up: Secondary | ICD-10-CM | POA: Insufficient documentation

## 2023-10-14 DIAGNOSIS — Z98891 History of uterine scar from previous surgery: Secondary | ICD-10-CM

## 2023-10-14 DIAGNOSIS — O09293 Supervision of pregnancy with other poor reproductive or obstetric history, third trimester: Secondary | ICD-10-CM | POA: Insufficient documentation

## 2023-10-14 NOTE — Progress Notes (Signed)
 MFM Consult Note  Lori Lloyd is currently at 34 weeks and 2 days.  She has been followed due to a history of placenta previa and vasa previa during her last pregnancy requiring a cesarean delivery at 33+ weeks.  She denies any problems since her last exam and reports feeling fetal movements throughout the day.  The overall EFW of 5 pounds 7 ounces measures at the 55th percentile for her gestational age.  There was normal amniotic fluid noted.    There were no signs of placenta previa noted today.  As the fetal growth is within normal limits, no further exams were scheduled in our office.  The patient would like to attempt a TOLAC at term.  She was advised that this is reasonable as placenta previa is not present in her current pregnancy.  The patient stated that all of her questions were answered today.  A total of 20 minutes was spent counseling and coordinating the care for this patient.  Greater than 50% of the time was spent in direct face-to-face contact.

## 2023-10-26 ENCOUNTER — Encounter: Payer: Self-pay | Admitting: Advanced Practice Midwife

## 2023-10-26 ENCOUNTER — Other Ambulatory Visit (HOSPITAL_COMMUNITY)
Admission: RE | Admit: 2023-10-26 | Discharge: 2023-10-26 | Disposition: A | Discharge status: Home / Self Care | Source: Ambulatory Visit | Attending: Advanced Practice Midwife | Admitting: Advanced Practice Midwife

## 2023-10-26 ENCOUNTER — Ambulatory Visit: Admitting: Advanced Practice Midwife

## 2023-10-26 VITALS — BP 111/72 | HR 106 | Wt 183.6 lb

## 2023-10-26 DIAGNOSIS — R4589 Other symptoms and signs involving emotional state: Secondary | ICD-10-CM

## 2023-10-26 DIAGNOSIS — O09892 Supervision of other high risk pregnancies, second trimester: Secondary | ICD-10-CM | POA: Diagnosis not present

## 2023-10-26 DIAGNOSIS — O09899 Supervision of other high risk pregnancies, unspecified trimester: Secondary | ICD-10-CM | POA: Insufficient documentation

## 2023-10-26 DIAGNOSIS — Z98891 History of uterine scar from previous surgery: Secondary | ICD-10-CM

## 2023-10-26 DIAGNOSIS — O368130 Decreased fetal movements, third trimester, not applicable or unspecified: Secondary | ICD-10-CM

## 2023-10-26 DIAGNOSIS — Z3A36 36 weeks gestation of pregnancy: Secondary | ICD-10-CM

## 2023-10-26 NOTE — Progress Notes (Signed)
 Pt states pain and pressure in lower abdomen past two days (scale of 4).   No other concerns at this time.

## 2023-10-26 NOTE — Progress Notes (Signed)
   PRENATAL VISIT NOTE  Subjective:  Lori Lloyd is a 28 y.o. [id number] at [redacted]w[redacted]d being seen today for ongoing prenatal care.  She is currently monitored for the following issues for this high-risk pregnancy and has Depressive disorder; History of placenta previa; History of cesarean section; Supervision of other high risk pregnancy, antepartum; History of preterm delivery, currently pregnant in second trimester; Marijuana use during pregnancy; and Tobacco use affecting pregnancy, antepartum on their problem list.  Patient reports no complaints.  Contractions: Irritability. Vag. Bleeding: None.  Movement: Increased. Denies leaking of fluid.   The following portions of the patient's history were reviewed and updated as appropriate: allergies, current medications, past family history, past medical history, past social history, past surgical history and problem list.   Objective:   Vitals:   10/26/23 0856  BP: 111/72  Pulse: (!) 106  Weight: 183 lb 9.6 oz (83.3 kg)    Fetal Status: Fetal Heart Rate (bpm): 141 Fundal Height: 36 cm Movement: Increased     General:  Alert, oriented and cooperative. Patient is in no acute distress.  Skin: Skin is warm and dry. No rash noted.   Cardiovascular: Normal heart rate noted  Respiratory: Normal respiratory effort, no problems with respiration noted  Abdomen: Soft, gravid, appropriate for gestational age.  Pain/Pressure: Present     Pelvic: Cervical exam performed in the presence of a chaperone Dilation: Fingertip Effacement (%): 60 Station: -2  Extremities: Normal range of motion.  Edema: Trace  Mental Status: Normal mood and affect. Normal behavior. Normal judgment and thought content.   Assessment and Plan:  Pregnancy: [id number] at [redacted]w[redacted]d 1. Supervision of other high risk pregnancy, antepartum (Primary) --Anticipatory guidance about next visits/weeks of pregnancy given.  --Reviewed labor readiness with patient including the [facility] [employer], evening  primrose oil, and raspberry leaf tea.    2. History of cesarean section --Plans VBAC  3. [redacted] weeks gestation of pregnancy   4. History of preterm delivery, currently pregnant in second trimester   5. Depressed mood --Zoloft  Rx resent 4/23. Pt started medication, reports improved mood.   Term labor symptoms and general obstetric precautions including but not limited to vaginal bleeding, contractions, leaking of fluid and fetal movement were reviewed in detail with the patient. Please refer to After Visit Summary for other counseling recommendations.   Return in about 1 week (around 11/02/2023) for LOB.  No future appointments.   Arlester Bence, CNM

## 2023-10-27 LAB — CERVICOVAGINAL ANCILLARY ONLY
Chlamydia: NEGATIVE
Comment: NEGATIVE
Comment: NEGATIVE
Comment: NORMAL
Neisseria Gonorrhea: NEGATIVE
Trichomonas: NEGATIVE

## 2023-10-28 ENCOUNTER — Encounter (HOSPITAL_COMMUNITY): Payer: Self-pay | Admitting: Obstetrics & Gynecology

## 2023-10-28 ENCOUNTER — Inpatient Hospital Stay (HOSPITAL_COMMUNITY)
Admission: AD | Admit: 2023-10-28 | Discharge: 2023-10-28 | Disposition: A | Discharge status: Home / Self Care | Attending: Obstetrics & Gynecology | Admitting: Obstetrics & Gynecology

## 2023-10-28 DIAGNOSIS — E860 Dehydration: Secondary | ICD-10-CM | POA: Diagnosis not present

## 2023-10-28 DIAGNOSIS — O9A213 Injury, poisoning and certain other consequences of external causes complicating pregnancy, third trimester: Secondary | ICD-10-CM | POA: Diagnosis not present

## 2023-10-28 DIAGNOSIS — O99283 Endocrine, nutritional and metabolic diseases complicating pregnancy, third trimester: Secondary | ICD-10-CM | POA: Diagnosis not present

## 2023-10-28 DIAGNOSIS — Z3A36 36 weeks gestation of pregnancy: Secondary | ICD-10-CM | POA: Diagnosis not present

## 2023-10-28 DIAGNOSIS — T472X5A Adverse effect of stimulant laxatives, initial encounter: Secondary | ICD-10-CM | POA: Diagnosis not present

## 2023-10-28 DIAGNOSIS — R42 Dizziness and giddiness: Secondary | ICD-10-CM | POA: Diagnosis not present

## 2023-10-28 DIAGNOSIS — Z3689 Encounter for other specified antenatal screening: Secondary | ICD-10-CM

## 2023-10-28 MED ORDER — ACETAMINOPHEN 500 MG PO TABS
1000.0000 mg | ORAL_TABLET | Freq: Once | ORAL | Status: AC
  Administered 2023-10-28: 1000 mg via ORAL
  Filled 2023-10-28: qty 2

## 2023-10-28 MED ORDER — ONDANSETRON HCL 4 MG PO TABS: 4.0000 mg | ORAL_TABLET | Freq: Three times a day (TID) | ORAL | 0 refills | Status: DC | PRN

## 2023-10-28 MED ORDER — ONDANSETRON 4 MG PO TBDP
8.0000 mg | ORAL_TABLET | Freq: Once | ORAL | Status: AC
  Administered 2023-10-28: 8 mg via ORAL
  Filled 2023-10-28: qty 2

## 2023-10-28 NOTE — MAU Note (Signed)
..  Lori Lloyd is a 28 y.o. at [redacted]w[redacted]d here in MAU reporting: drank 4 oz of castor oil because she was ready to have the baby and she has been constipated. She did have a bowel movement last night. IS know feeling dizzy and weak with some leg pain and leg weakness and wondering if it could be from too much castor oil.   Endorses +FM. Denies leaking fluid or bleeding from vagina.    Onset of complaint: 1900 Pain score: 6/10 Vitals:   10/28/23 2200  BP: 102/64  Pulse: 95  Resp: 16  Temp: 98.7 F (37.1 C)  SpO2: 99%     FHT:130  Lab orders placed from triage: UA

## 2023-10-28 NOTE — MAU Provider Note (Signed)
 MAU Provider Note  Chief Complaint: Fatigue (Dizziness and leg pain)   Event Date/Time   First Provider Initiated Contact with Patient 10/28/23 2208      SUBJECTIVE HPI: Lori Lloyd is a 28 y.o. [id number] at [redacted]w[redacted]d by LMP who presents to maternity admissions reporting nausea, dizziness when she stands. Pregnancy c/b history of preterm delivery, tobacco/THC use. Receives [facility] with [facility].  Patient presents with increased nausea and dizziness after taking castor oil earlier today.  States that she is ready for this pregnancy to be done.  She then started feeling dizzy.  The nausea is not new and she overall has not been very hungry the past couple weeks.  Has some dizziness when she stands.  Having some mild, bilateral leg cramping as well.  Having normal fetal movement.  Denies vaginal bleeding, loss of fluid, urinary symptoms, change in vaginal discharge.  HPI  Past Medical History:  Diagnosis Date   Asthma    Depression    Eczema    Fracture    ring finger right hand   Past Surgical History:  Procedure Laterality Date   CESAREAN SECTION N/A 06/26/2016   Procedure: CESAREAN SECTION;  Surgeon: Rik Chasten, MD;  Location: [facility] BIRTHING SUITES;  Service: Obstetrics;  Laterality: N/A;   INDUCED ABORTION     WISDOM TOOTH EXTRACTION     Social History   Socioeconomic History   Marital status: Single    Spouse name: Not on file   Number of children: Not on file   Years of education: Not on file   Highest education level: Not on file  Occupational History   Not on file  Tobacco Use   Smoking status: Every Day    Current packs/day: 0.25    Average packs/day: 0.3 packs/day for 0.5 years (0.1 ttl pk-yrs)    Types: Cigarettes    Passive exposure: Never   Smokeless tobacco: Never   Tobacco comments:    with preg  Vaping Use   Vaping status: Never Used  Substance and Sexual Activity   Alcohol use: No   Drug use: No    Types: Marijuana    Comment: prior to preg   Sexual  activity: Yes    Birth control/protection: None    Comment: two weeks ago  Other Topics Concern   Not on file  Social History Narrative   Not on file   Social Drivers of Health   Financial Resource Strain: Not on file  Food Insecurity: Not on file  Transportation Needs: Not on file  Physical Activity: Not on file  Stress: Not on file  Social Connections: Not on file  Intimate Partner Violence: Not on file   No current facility-administered medications on file prior to encounter.   Current Outpatient Medications on File Prior to Encounter  Medication Sig Dispense Refill   Prenatal Vit-Fe Fumarate-FA (PRENATAL MULTIVITAMIN) TABS tablet Take 1 tablet by mouth daily at 12 noon. 30 tablet 12   Blood Pressure Monitoring (BLOOD PRESSURE KIT) DEVI 1 kit by Does not apply route once a week. Check Blood Pressure regularly and record readings into the Babyscripts App.  Large Cuff.  DX O90.0 (Patient not taking: Reported on 09/14/2023) 1 each 0   metroNIDAZOLE  (FLAGYL ) 500 MG tablet Take 1 tablet (500 mg total) by mouth 2 (two) times daily. (Patient not taking: Reported on 09/14/2023) 14 tablet 0   Misc. Devices (GOJJI WEIGHT SCALE) MISC 1 Device by Does not apply route once a week. (Patient  not taking: Reported on 09/14/2023) 1 each 0   sertraline  (ZOLOFT ) 25 MG tablet Take 1 tablet (25 mg total) by mouth daily. (Patient not taking: Reported on 10/26/2023) 30 tablet 2   terconazole  (TERAZOL 7 ) 0.4 % vaginal cream Place 1 applicator vaginally at bedtime. Use for seven days (Patient not taking: Reported on 09/14/2023) 45 g 0   Allergies  Allergen Reactions   Shellfish Allergy Anaphylaxis    ROS:  Pertinent positives/negatives listed above.  I have reviewed patient's Past Medical Hx, Surgical Hx, Family Hx, Social Hx, medications and allergies.   Physical Exam  Patient Vitals for the past 24 hrs:  BP Temp Temp src Pulse Resp SpO2  10/28/23 2230 -- -- -- -- -- 97 %  10/28/23 2220 -- -- -- -- --  99 %  10/28/23 2218 107/67 -- -- (!) 102 -- --  10/28/23 2217 101/62 -- -- 97 -- --  10/28/23 2215 (!) 103/59 -- -- 92 -- 99 %  10/28/23 2205 -- -- -- -- -- 99 %  10/28/23 2200 102/64 98.7 F (37.1 C) Oral 95 16 99 %   Constitutional: Well-developed, well-nourished female in no acute distress.  Appears fatigued Cardiovascular: normal rate Respiratory: normal effort GI: Abd soft, non-tender MS: Extremities nontender, no edema, normal ROM Neurologic: Alert and oriented x 4  GU: Neg CVAT  FHT:  Baseline 125 moderate variability, accelerations present, no decelerations Contractions: Irregular  LAB RESULTS No results found for this or any previous visit (from the past 24 hours).  O/Positive/O POS (11/15 0000)  IMAGING US  MFM OB FOLLOW UP Result Date: 10/14/2023 ----------------------------------------------------------------------  OBSTETRICS REPORT                       (Signed Final 10/14/2023 02:52 pm) ---------------------------------------------------------------------- Patient Info  ID #:       [id number]                          D.O.B.:  26-Nov-1995 (27 yrs)(F)  Name:       Lori Lloyd                Visit Date: 10/14/2023 01:18 pm ---------------------------------------------------------------------- Performed By  Attending:        Sal Crass MD         Ref. Address:     [address]                                                             [place], [place]                                                             [address]  Performed By:     Levonne Rear        [facility]                                  [facility]  Referred By:      [facility] ----------------------------------------------------------------------  Orders  #  Description                           Code        Ordered By  1  US  MFM OB FOLLOW UP                   76816.01    YU FANG ----------------------------------------------------------------------  #  Order #                     Accession #                Episode #  1  [id number]                   [id number]                 [id number] ---------------------------------------------------------------------- Indications  Poor obstetric history: Previous preterm       O09.219  delivery, antepartum (Cesarean delivery 33w  due to placenta previa with vaginal bleeding)  Previous cesarean delivery, antepartum         O34.219  Drug use complicating pregnancy, third         O99.323  trimester (THC)  LR NIPS - Female, AFP Negative, 2hr GTT  WNL  [redacted] weeks gestation of pregnancy                Z3A.34 ---------------------------------------------------------------------- Fetal Evaluation  Num Of Fetuses:         1  Fetal Heart Rate(bpm):  145  Cardiac Activity:       Observed  Presentation:           Cephalic  Placenta:               Posterior  P. Cord Insertion:      Visualized  Amniotic Fluid  AFI FV:      Within normal limits  AFI Sum(cm)     %Tile       Largest Pocket(cm)  18.17           67          7.82  RUQ(cm)  RLQ(cm)       LUQ(cm)        LLQ(cm)  7.82          4.21          3.63           2.51 ---------------------------------------------------------------------- Biometry  BPD:      84.8  mm     G. Age:  34w 1d         45  %    CI:        80.95   %    70 - 86                                                          FL/HC:      21.5   %    19.4 - 21.8  HC:      297.6  mm     G. Age:  33w 0d        2.4  %    HC/AC:      0.93        0.96 - 1.11  AC:      319.3  mm     G. Age:  35w 6d         91  %    FL/BPD:     75.5   %    71 - 87  FL:         64  mm     G. Age:  33w 0d         13  %    FL/AC:      20.0   %    20 - 24  LV:        2.9  mm  Est. FW:    2477  gm      5 lb 7 oz     55  %  ---------------------------------------------------------------------- OB History  Blood Type:   O+  Gravidity:    6         Term:   1        Prem:   1  TOP:          3        Living:  2 ---------------------------------------------------------------------- Gestational Age  LMP:           34w 2d        Date:  02/16/23                  EDD:   11/23/23  U/S Today:     34w 0d                                        EDD:   11/25/23  Best:          34w 2d     Det. By:  LMP  (02/16/23)          EDD:   11/23/23 ---------------------------------------------------------------------- Targeted Anatomy  Central Nervous System  Calvarium/Cranial V.:  Appears normal         Cereb./Vermis:          Previously seen  Cavum:  Appears normal         Cisterna Magna:         Previously seen  Lateral Ventricles:    Appears normal         Midline Falx:           Previously seen  Choroid Plexus:        Previously seen  Spine  Cervical:              Ltd previously         Sacral:                 Ltd previously  Thoracic:              Ltd previously         Shape/Curvature:        Previously seen  Lumbar:                Ltd previously  Head/Neck  Lips:                  Not well visualized    Profile:                Previously seen  Neck:                  Appears normal         Orbits/Eyes:            Previously seen  Nuchal Fold:           Not applicable         Mandible:               Previously seen  Nasal Bone:            Previously seen        Maxilla:                Previously seen  Thorax  4 Chamber View:        Previously seen        Interventr. Septum:     Previously seen  Cardiac Rhythm:        Normal                 Cardiac Axis:           Previously seen  Cardiac Situs:         Previously seen        Diaphragm:              Appears normal  Rt Outflow Tract:      Not well visualized    3 Vessel View:          Not well visualized  Lt Outflow Tract:      Previously seen        3 V Trachea View:       Previously seen  Aortic  Arch:           Previously seen        IVC:                    Previously seen  Ductal Arch:           Previously seen        Crossing:               Previously seen  SVC:  Previously seen  Abdomen  Ventral Wall:          Not well visualized    Lt Kidney:              Appears normal  Cord Insertion:        Not well visualized    Rt Kidney:              Appears normal  Situs:                 Appears normal         Bladder:                Appears normal  Stomach:               Appears normal  Extremities  Lt Humerus:            Previously seen        Lt Femur:               Previously seen  Rt Humerus:            Previously seen        Rt Femur:               Previously seen  Lt Forearm:            Previously seen        Lt Lower Leg:           Previously seen  Rt Forearm:            Previously seen        Rt Lower Leg:           Previously seen  Lt Hand:               Previously seen        Lt Foot:                Foot visualized  Rt Hand:               Previously seen        Rt Foot:                Foot visualized  Other  Umbilical Cord:        Previously seen        Genitalia:              Female prev seen  Comment:     Technically difficult due to gestational age. ---------------------------------------------------------------------- Comments  Charnetta A Stoffel is currently at 34 weeks and 2 days.  She  has been followed due to a history of placenta previa and  vasa previa during her last pregnancy requiring a cesarean  delivery at 33+ weeks.  She denies any problems since her last exam and reports  feeling fetal movements throughout the day.  The overall EFW of 5 pounds 7 ounces measures at the 55th  percentile for her gestational age.  There was normal  amniotic fluid noted.  There were no signs of placenta previa noted today.  As the fetal growth is within normal limits, no further exams  were scheduled in our office.  The patient would like to attempt a TOLAC at term.  She was  advised that  this is reasonable as placenta previa is not  present in her current pregnancy.  The patient stated that all of her questions were answered  today.  A total  of 20 minutes was spent counseling and coordinating  the care for this patient.  Greater than 50% of the time was  spent in direct face-to-face contact. ----------------------------------------------------------------------                   Sal Crass, MD Electronically Signed Final Report   10/14/2023 02:52 pm ----------------------------------------------------------------------    MAU Management/MDM: Orders Placed This Encounter  Procedures   Orthostatic vital signs   Discharge patient    Meds ordered this encounter  Medications   ondansetron  (ZOFRAN -ODT) disintegrating tablet 8 mg   acetaminophen  (TYLENOL ) tablet 1,000 mg   ondansetron  (ZOFRAN ) 4 MG tablet    Sig: Take 1 tablet (4 mg total) by mouth every 8 (eight) hours as needed for nausea or vomiting.    Dispense:  30 tablet    Refill:  0     Available prenatal records reviewed.  Patient presents with nausea, dizziness, leg cramping at [redacted]w[redacted]d after drinking castor oil.  Did discuss the risks of castor oil including distress of fetus and preterm labor.  I do suspect that given her ongoing nausea and recent castor oil use, she has been somewhat dehydrated and mildly malnourished which is likely causing her dizziness and cramping symptoms.  Will obtain orthostatic vital signs.  Will also give Zofran  and Tylenol  and encourage p.o. intake.  After hydration and snack will reassess patient to see how she is feeling.  I do not suspect that she has major electrolyte changes given no vomiting or diarrhea, acute gastroenteritis, or any cardiac etiology for her symptoms.  Patient much improved after above interventions.  She was able to keep down fluids and crackers.  Leg cramping also improved.  Now would like to go home and go to sleep.  Encouraged continued hydration.  Did provide Zofran   prescription for home.  Reiterated castor oil is not safe during pregnancy.  FWB: Positive fetal movement, reactive NST  ASSESSMENT 1. Dehydration   2. Castor oil adverse reaction, initial encounter   3. NST (non-stress test) reactive   4. [redacted] weeks gestation of pregnancy     PLAN Discharge home with strict return precautions. Allergies as of 10/28/2023       Reactions   Shellfish Allergy Anaphylaxis        Medication List     STOP taking these medications    Gojji Weight Scale Misc   metroNIDAZOLE  500 MG tablet Commonly known as: FLAGYL    sertraline  25 MG tablet Commonly known as: Zoloft    terconazole  0.4 % vaginal cream Commonly known as: TERAZOL 7        TAKE these medications    Blood Pressure Kit Devi 1 kit by Does not apply route once a week. Check Blood Pressure regularly and record readings into the Babyscripts App.  Large Cuff.  DX O90.0   ondansetron  4 MG tablet Commonly known as: ZOFRAN  Take 1 tablet (4 mg total) by mouth every 8 (eight) hours as needed for nausea or vomiting. What changed:  when to take this reasons to take this   prenatal multivitamin Tabs tablet Take 1 tablet by mouth daily at 12 noon.         Authur Leghorn, MD OB Fellow 10/28/2023  11:35 PM

## 2023-10-30 LAB — CULTURE, BETA STREP (GROUP B ONLY): Strep Gp B Culture: NEGATIVE

## 2023-11-01 ENCOUNTER — Telehealth: Payer: Self-pay

## 2023-11-01 NOTE — Telephone Encounter (Signed)
 Spoke to pt about RX for breast pump.  Pt is calling [facility] to have RX faxed to the office.

## 2023-11-02 ENCOUNTER — Ambulatory Visit

## 2023-11-02 VITALS — BP 121/74 | HR 100 | Wt 182.4 lb

## 2023-11-02 DIAGNOSIS — Z3A37 37 weeks gestation of pregnancy: Secondary | ICD-10-CM | POA: Diagnosis not present

## 2023-11-02 DIAGNOSIS — F32A Depression, unspecified: Secondary | ICD-10-CM

## 2023-11-02 DIAGNOSIS — Z98891 History of uterine scar from previous surgery: Secondary | ICD-10-CM

## 2023-11-02 DIAGNOSIS — O09899 Supervision of other high risk pregnancies, unspecified trimester: Secondary | ICD-10-CM | POA: Diagnosis not present

## 2023-11-02 NOTE — Progress Notes (Signed)
 Pt states pressure in lower pelvis (scale of 5).  Would like to discuss induction date.

## 2023-11-02 NOTE — Patient Instructions (Signed)
 The [employer]  This circuit takes at least 90 minutes to complete so clear your schedule and make mental preparations so you can relax in your environment. The second step requires a lot of pillows so gather them up before beginning Before starting, you should empty your bladder! Have a nice drink nearby, and make sure it has a straw! If you are having contractions, this circuit should be done through contractions, try not to change positions between steps Before you begin...  "I named this 'circuit' after my friend Deneen Harts, who shared and discussed it with me when I was working with a client whose labor seemed to be stalled out and no longer progressing... This circuit is useful to help get the baby lined up, ideally, in the "Left Occiput Anterior" (LOA) Position, both before labor begins and when some corrections need to be done during labor. Prenatally, this position set can help to rotate a baby. As a natural method of induction, this can help get things going if baby just needed a gentle nudge of position to set things off. To the best of my knowledge, this group of positions will not "hurt" a baby that is already lined up correctly." - Sharlyn Bologna   Step One: Open-knee Chest Stay in this position for 30 minutes, start in cat/cow, then drop your chest as low as you can to the bed or the floor and your bottom as high as you can. Knees should be fairly wide apart, and the angle between the torso/thighs should be wider than 90 degrees. Wiggle around, prop with lots of pillows and use this time to get totally relaxed. This position allows the baby to scoot out of the pelvis a bit and gives them room to rotate, shift their head position, etc. If the pregnant person finds it helpful, careful positioning with a rebozo under the belly, with gentle tension from a support person behind can help maintain this position for the full 30 minutes.  Step WGN:[id number] Left Side  Lying Roll to your left side, bringing your top leg as high as possible and keeping your bottom leg straight. Roll forward as much as possible, again using a lot of pillows. Sink into the bed and relax some more. If you fall asleep, that's totally okay and you can stay there! If not, stay here for at least another half an hour. Try and get your top right leg up towards your head and get as rolled over onto your belly as much as possible. If you repeat the circuit during labor, try alternating left and right sides. We know the photo the left is actually right side... just flip the image in your head.  Step Three: Moving and Lunges Lunge, walk stairs facing sideways, 2 at a time, (have a spotter downstairs of you!), take a walk outside with one foot on the curb and the other on the street, sit on a birth ball and hula- anything that's upright and putting your pelvis in open, asymmetrical positions. Spend at least 30 minutes doing this one as well to give your baby a chance to move down. If you are lunging or stair or curb walking, you should lunge/walk/go up stairs in the direction that feels better to you. The key with the lunge is that the toes of the higher leg and mom's belly button should be at right angles. Do not lunge over your knee, that closes the pelvis.     Megan Hamilton Miles: Tourist information centre manager - [email or web address] Jasmine December  Muza, CD, BDT (DONA), LCCE, FACCE: Supporting Content - [email or web address] Rulon Eisenmenger: Photography - [email or web address] Luther Hearing CD/CDT [facility]): Print and Webmaster - [email or web address] [employer] [email or web address]

## 2023-11-02 NOTE — Progress Notes (Signed)
   HIGH-RISK PREGNANCY OFFICE VISIT  Patient name: Lori Lloyd MRN [id number]  Date of birth: [birth year] Chief Complaint:   No chief complaint on file.  Subjective:   Lori Lloyd is a 28 y.o. [phone] female at [redacted]w[redacted]d with an Estimated Date of Delivery: 11/23/23 being seen today for ongoing management of a high-risk pregnancy aeb has Depressive disorder; History of placenta previa; History of cesarean section; Supervision of other high risk pregnancy, antepartum; History of preterm delivery, currently pregnant in second trimester; Marijuana use during pregnancy; and Tobacco use affecting pregnancy, antepartum on their problem list.  Patient presents today, alone, with no complaints.  However, patient questions when she will be induced. Patient endorses fetal movement. Patient reports occasional abdominal cramping or contractions.  Patient denies vaginal concerns including abnormal discharge, leaking of fluid, and bleeding. No issues with urination, constipation, or diarrhea.    Contractions: Irritability. Vag. Bleeding: None.  Movement: Present.  Reviewed past medical,surgical, social, obstetrical and family history as well as problem list, medications and allergies.  Objective   Vitals:   11/02/23 1333  BP: 121/74  Pulse: 100  Weight: 182 lb 6.4 oz (82.7 kg)  Body mass index is 34.46 kg/m.  Total Weight Gain:62 lb 6.4 oz (28.3 kg)         Physical Examination:   General appearance: Well appearing, and in no distress  Mental status: Alert, oriented to person, place, and time  Skin: Warm & dry  Cardiovascular: Normal heart rate noted  Respiratory: Normal respiratory effort, no distress  Abdomen: Soft, gravid, nontender, AGA with Fundal Height: 36 cm  Pelvic: Cervical exam deferred           Extremities: Edema: Trace  Fetal Status: Fetal Heart Rate (bpm): 147  Movement: Present   No results found for this or any previous visit (from the past 24 hours).  Assessment & Plan:   High-risk pregnancy of a 28 y.o., [id number] at [redacted]w[redacted]d with an Estimated Date of Delivery: 11/23/23   1. Supervision of other high risk pregnancy, antepartum -Anticipatory guidance for upcoming appts. -Patient to schedule next appt in 1 weeks for an in-person visit.  2. History of cesarean section -Desires VBAC  3. Depressive disorder -States she hasn't picked up Zoloft  -She reports mood is "mean" per her kids, but she feels she is trying. -She denies SI/HI behaviors.   4. [redacted] weeks gestation of pregnancy -Doing well. -Discussed PD induction at 40+ weeks especially under circumstance of VBAC. -Reviewed risks and benefits of induction in setting of VBAC.       Meds: No orders of the defined types were placed in this encounter.  Labs/procedures today:  Lab Orders  No laboratory test(s) ordered today     Reviewed: Term labor symptoms and general obstetric precautions including but not limited to vaginal bleeding, contractions, leaking of fluid and fetal movement were reviewed in detail with the patient.  All questions were answered.  Follow-up: No follow-ups on file.  No orders of the defined types were placed in this encounter.  Kraig Peru MSN, CNM 11/02/2023

## 2023-11-09 ENCOUNTER — Ambulatory Visit (INDEPENDENT_AMBULATORY_CARE_PROVIDER_SITE_OTHER): Admitting: Obstetrics and Gynecology

## 2023-11-09 VITALS — BP 103/68 | HR 89 | Wt 193.0 lb

## 2023-11-09 DIAGNOSIS — O09899 Supervision of other high risk pregnancies, unspecified trimester: Secondary | ICD-10-CM | POA: Diagnosis not present

## 2023-11-09 DIAGNOSIS — Z98891 History of uterine scar from previous surgery: Secondary | ICD-10-CM | POA: Diagnosis not present

## 2023-11-09 NOTE — Progress Notes (Signed)
   PRENATAL VISIT NOTE  Subjective:  Lori Lloyd is a 28 y.o. [id number] at [redacted]w[redacted]d being seen today for ongoing prenatal care.  She is currently monitored for the following issues for this high-risk pregnancy and has Depressive disorder; History of placenta previa; History of cesarean section; Supervision of other high risk pregnancy, antepartum; History of preterm delivery, currently pregnant in second trimester; Marijuana use during pregnancy; and Tobacco use affecting pregnancy, antepartum on their problem list.  Patient reports no complaints.  Contractions: Irregular. Vag. Bleeding: None.  Movement: Present. Denies leaking of fluid.   The following portions of the patient's history were reviewed and updated as appropriate: allergies, current medications, past family history, past medical history, past social history, past surgical history and problem list.   Objective:   Vitals:   11/09/23 1138  BP: 103/68  Pulse: 89  Weight: 193 lb (87.5 kg)   Body mass index is 36.47 kg/m. Total weight gain: 73 lb (33.1 kg)   Fetal Status: Fetal Heart Rate (bpm): 145 Fundal Height: 38 cm Movement: Present     General:  Alert, oriented and cooperative. Patient is in no acute distress.  Skin: Skin is warm and dry. No rash noted.   Cardiovascular: Normal heart rate noted  Respiratory: Normal respiratory effort, no problems with respiration noted  Abdomen: Soft, gravid, appropriate for gestational age.  Pain/Pressure: Present     Pelvic: Cervical exam performed in the presence of a chaperone Dilation: 1 Effacement (%): 50 Station: -3  Extremities: Normal range of motion.     Mental Status: Normal mood and affect. Normal behavior. Normal judgment and thought content.   Assessment and Plan:  Pregnancy: [id number] at [redacted]w[redacted]d 1. Supervision of other high risk pregnancy, antepartum (Primary) Doing well Anticipatory guidance  2. History of cesarean section Plans VBAC  Term labor symptoms and  general obstetric precautions including but not limited to vaginal bleeding, contractions, leaking of fluid and fetal movement were reviewed in detail with the patient. Please refer to After Visit Summary for other counseling recommendations.   No follow-ups on file.  No future appointments.  Marci Setter, MD

## 2023-11-17 ENCOUNTER — Ambulatory Visit: Admitting: Obstetrics and Gynecology

## 2023-11-17 ENCOUNTER — Encounter: Admitting: Physician Assistant

## 2023-11-17 VITALS — BP 103/72 | HR 106 | Wt 186.0 lb

## 2023-11-17 DIAGNOSIS — Z3A39 39 weeks gestation of pregnancy: Secondary | ICD-10-CM

## 2023-11-17 DIAGNOSIS — O09899 Supervision of other high risk pregnancies, unspecified trimester: Secondary | ICD-10-CM | POA: Diagnosis not present

## 2023-11-17 DIAGNOSIS — Z98891 History of uterine scar from previous surgery: Secondary | ICD-10-CM

## 2023-11-17 DIAGNOSIS — Z8759 Personal history of other complications of pregnancy, childbirth and the puerperium: Secondary | ICD-10-CM

## 2023-11-17 DIAGNOSIS — O99330 Smoking (tobacco) complicating pregnancy, unspecified trimester: Secondary | ICD-10-CM

## 2023-11-17 DIAGNOSIS — F1290 Cannabis use, unspecified, uncomplicated: Secondary | ICD-10-CM

## 2023-11-17 DIAGNOSIS — O09892 Supervision of other high risk pregnancies, second trimester: Secondary | ICD-10-CM

## 2023-11-17 DIAGNOSIS — O99320 Drug use complicating pregnancy, unspecified trimester: Secondary | ICD-10-CM

## 2023-11-17 DIAGNOSIS — F32A Depression, unspecified: Secondary | ICD-10-CM

## 2023-11-17 MED ORDER — NICOTINE 21 MG/24HR TD PT24: 21.0000 mg | MEDICATED_PATCH | Freq: Every day | TRANSDERMAL | 2 refills | Status: DC

## 2023-11-17 NOTE — Progress Notes (Signed)
   PRENATAL VISIT NOTE  Subjective:  Lori Lloyd is a 28 y.o. [id number] at [redacted]w[redacted]d being seen today for ongoing prenatal care.  She is currently monitored for the following issues for this low-risk pregnancy and has Depressive disorder; History of placenta previa; History of cesarean section; Supervision of other high risk pregnancy, antepartum; History of preterm delivery, currently pregnant in second trimester; Marijuana use during pregnancy; and Tobacco use affecting pregnancy, antepartum on their problem list.  Patient reports doing ok overall, having more pressure and legs are sore.  Contractions: Irritability. Vag. Bleeding: None.  Movement: Present. Denies leaking of fluid.   The following portions of the patient's history were reviewed and updated as appropriate: allergies, current medications, past family history, past medical history, past social history, past surgical history and problem list.   Objective:   Vitals:   11/17/23 1602  BP: 103/72  Pulse: (!) 106  Weight: 186 lb (84.4 kg)   Fetal Status: Fetal Heart Rate (bpm): 152 Fundal Height: 39 cm Movement: Present  Presentation: Vertex  General:  Alert, oriented and cooperative. Patient is in no acute distress.  Skin: Skin is warm and dry. No rash noted.   Cardiovascular: Normal heart rate noted  Respiratory: Normal respiratory effort, no problems with respiration noted  Abdomen: Soft, gravid, appropriate for gestational age.  Pain/Pressure: Present     SCE 1/30/-3, performed in presence of chaperone  Assessment and Plan:  Pregnancy: [id number] at [redacted]w[redacted]d 1. Supervision of other high risk pregnancy, antepartum (Primary) 2. [redacted] weeks gestation of pregnancy Discussed pdIOL at 41wk, pt accepts - ordered  3. History of cesarean section 4. History of placenta previa 5. History of preterm delivery, currently pregnant in second trimester G5 pLTCS for bleeding placenta previa at [redacted]w[redacted]d Plans TOLAC, consent previously signed  6.  Marijuana use during pregnancy 7. Tobacco use affecting pregnancy, antepartum 4cpd, smokes immediately upon waking up Reviewed risks of THC and tobacco Discussed option for nicotine replacement but there there are limitations & potential risks. She would like to try nicotine patch. Rx sent  8. Depressive disorder Mood is OK, does not feel depressed. Had PP depression in both prior pregnancies Reviewed high risk for PP depression this pregnancy. She would like to avoid medication if possible No current therapist/counselor, accepting of IBH referral - IBH referral placed  Term labor symptoms and general obstetric precautions including but not limited to vaginal bleeding, contractions, leaking of fluid and fetal movement were reviewed in detail with the patient. Please refer to After Visit Summary for other counseling recommendations.   No follow-ups on file.  No future appointments.  Izell Marsh, MD

## 2023-11-17 NOTE — Progress Notes (Signed)
ROB, c/o pain and pressure.

## 2023-11-18 ENCOUNTER — Inpatient Hospital Stay (HOSPITAL_COMMUNITY)
Admission: AD | Admit: 2023-11-18 | Discharge: 2023-11-20 | DRG: 807 | Disposition: A | Discharge status: Home / Self Care | Attending: Obstetrics and Gynecology | Admitting: Obstetrics and Gynecology

## 2023-11-18 ENCOUNTER — Inpatient Hospital Stay (HOSPITAL_COMMUNITY)

## 2023-11-18 ENCOUNTER — Encounter (HOSPITAL_COMMUNITY): Payer: Self-pay | Admitting: Obstetrics and Gynecology

## 2023-11-18 DIAGNOSIS — Z8249 Family history of ischemic heart disease and other diseases of the circulatory system: Secondary | ICD-10-CM | POA: Diagnosis not present

## 2023-11-18 DIAGNOSIS — Z833 Family history of diabetes mellitus: Secondary | ICD-10-CM | POA: Diagnosis not present

## 2023-11-18 DIAGNOSIS — Z3A39 39 weeks gestation of pregnancy: Secondary | ICD-10-CM

## 2023-11-18 DIAGNOSIS — Z98891 History of uterine scar from previous surgery: Secondary | ICD-10-CM

## 2023-11-18 DIAGNOSIS — O99323 Drug use complicating pregnancy, third trimester: Secondary | ICD-10-CM

## 2023-11-18 DIAGNOSIS — F17210 Nicotine dependence, cigarettes, uncomplicated: Secondary | ICD-10-CM | POA: Diagnosis present

## 2023-11-18 DIAGNOSIS — O34219 Maternal care for unspecified type scar from previous cesarean delivery: Secondary | ICD-10-CM | POA: Diagnosis present

## 2023-11-18 DIAGNOSIS — Z30017 Encounter for initial prescription of implantable subdermal contraceptive: Secondary | ICD-10-CM | POA: Diagnosis not present

## 2023-11-18 DIAGNOSIS — O368130 Decreased fetal movements, third trimester, not applicable or unspecified: Secondary | ICD-10-CM

## 2023-11-18 DIAGNOSIS — K219 Gastro-esophageal reflux disease without esophagitis: Secondary | ICD-10-CM | POA: Diagnosis present

## 2023-11-18 DIAGNOSIS — O99330 Smoking (tobacco) complicating pregnancy, unspecified trimester: Secondary | ICD-10-CM | POA: Diagnosis present

## 2023-11-18 DIAGNOSIS — O09892 Supervision of other high risk pregnancies, second trimester: Secondary | ICD-10-CM

## 2023-11-18 DIAGNOSIS — O99334 Smoking (tobacco) complicating childbirth: Secondary | ICD-10-CM | POA: Diagnosis present

## 2023-11-18 DIAGNOSIS — O368190 Decreased fetal movements, unspecified trimester, not applicable or unspecified: Principal | ICD-10-CM | POA: Diagnosis present

## 2023-11-18 DIAGNOSIS — O9962 Diseases of the digestive system complicating childbirth: Secondary | ICD-10-CM | POA: Diagnosis present

## 2023-11-18 DIAGNOSIS — O99324 Drug use complicating childbirth: Secondary | ICD-10-CM | POA: Diagnosis not present

## 2023-11-18 DIAGNOSIS — O34211 Maternal care for low transverse scar from previous cesarean delivery: Secondary | ICD-10-CM | POA: Diagnosis not present

## 2023-11-18 DIAGNOSIS — O99214 Obesity complicating childbirth: Secondary | ICD-10-CM | POA: Diagnosis present

## 2023-11-18 DIAGNOSIS — O9902 Anemia complicating childbirth: Secondary | ICD-10-CM | POA: Diagnosis present

## 2023-11-18 DIAGNOSIS — F1290 Cannabis use, unspecified, uncomplicated: Secondary | ICD-10-CM | POA: Diagnosis present

## 2023-11-18 DIAGNOSIS — O09899 Supervision of other high risk pregnancies, unspecified trimester: Secondary | ICD-10-CM

## 2023-11-18 DIAGNOSIS — O09213 Supervision of pregnancy with history of pre-term labor, third trimester: Secondary | ICD-10-CM

## 2023-11-18 DIAGNOSIS — Z8759 Personal history of other complications of pregnancy, childbirth and the puerperium: Secondary | ICD-10-CM

## 2023-11-18 DIAGNOSIS — O288 Other abnormal findings on antenatal screening of mother: Secondary | ICD-10-CM

## 2023-11-18 DIAGNOSIS — Z91013 Allergy to seafood: Secondary | ICD-10-CM

## 2023-11-18 LAB — CBC
HCT: 28.4 % — ABNORMAL LOW (ref 36.0–46.0)
Hemoglobin: 8.6 g/dL — ABNORMAL LOW (ref 12.0–15.0)
MCH: 26.5 pg (ref 26.0–34.0)
MCHC: 30.3 g/dL (ref 30.0–36.0)
MCV: 87.7 fL (ref 80.0–100.0)
Platelets: 170 10*3/uL (ref 150–400)
RBC: 3.24 MIL/uL — ABNORMAL LOW (ref 3.87–5.11)
RDW: 15.4 % (ref 11.5–15.5)
WBC: 9.7 10*3/uL (ref 4.0–10.5)
nRBC: 0.3 % — ABNORMAL HIGH (ref 0.0–0.2)

## 2023-11-18 LAB — TYPE AND SCREEN
ABO/RH(D): O POS
Antibody Screen: NEGATIVE

## 2023-11-18 MED ORDER — EPHEDRINE 5 MG/ML INJ: 10.0000 mg | INTRAVENOUS | Status: DC | PRN

## 2023-11-18 MED ORDER — FENTANYL CITRATE (PF) 100 MCG/2ML IJ SOLN
50.0000 ug | INTRAMUSCULAR | Status: DC | PRN
  Administered 2023-11-18 (×2): 100 ug via INTRAVENOUS
  Filled 2023-11-18: qty 2

## 2023-11-18 MED ORDER — OXYTOCIN-SODIUM CHLORIDE 30-0.9 UT/500ML-% IV SOLN: 2.5000 [IU]/h | INTRAVENOUS | Status: DC

## 2023-11-18 MED ORDER — ACETAMINOPHEN 325 MG PO TABS: 650.0000 mg | ORAL_TABLET | ORAL | Status: DC | PRN

## 2023-11-18 MED ORDER — SOD CITRATE-CITRIC ACID 500-334 MG/5ML PO SOLN: 30.0000 mL | ORAL | Status: DC | PRN

## 2023-11-18 MED ORDER — OXYTOCIN BOLUS FROM INFUSION
333.0000 mL | Freq: Once | INTRAVENOUS | Status: AC
  Administered 2023-11-19: 333 mL via INTRAVENOUS

## 2023-11-18 MED ORDER — LIDOCAINE HCL (PF) 1 % IJ SOLN: 30.0000 mL | INTRAMUSCULAR | Status: DC | PRN

## 2023-11-18 MED ORDER — LACTATED RINGERS IV SOLN
500.0000 mL | Freq: Once | INTRAVENOUS | Status: AC
  Administered 2023-11-19: 500 mL via INTRAVENOUS

## 2023-11-18 MED ORDER — DIPHENHYDRAMINE HCL 50 MG/ML IJ SOLN: 12.5000 mg | INTRAMUSCULAR | Status: DC | PRN

## 2023-11-18 MED ORDER — PHENYLEPHRINE 80 MCG/ML (10ML) SYRINGE FOR IV PUSH (FOR BLOOD PRESSURE SUPPORT): 80.0000 ug | PREFILLED_SYRINGE | INTRAVENOUS | Status: DC | PRN

## 2023-11-18 MED ORDER — ONDANSETRON HCL 4 MG/2ML IJ SOLN
4.0000 mg | Freq: Four times a day (QID) | INTRAMUSCULAR | Status: DC | PRN
  Administered 2023-11-19: 4 mg via INTRAVENOUS
  Filled 2023-11-18: qty 2

## 2023-11-18 MED ORDER — FENTANYL CITRATE (PF) 100 MCG/2ML IJ SOLN
INTRAMUSCULAR | Status: AC
Start: 2023-11-18 — End: 2023-11-18
  Administered 2023-11-18: 100 ug via INTRAVENOUS
  Filled 2023-11-18: qty 2

## 2023-11-18 MED ORDER — LACTATED RINGERS IV SOLN: INTRAVENOUS | Status: DC

## 2023-11-18 MED ORDER — OXYCODONE-ACETAMINOPHEN 5-325 MG PO TABS: 1.0000 | ORAL_TABLET | ORAL | Status: DC | PRN

## 2023-11-18 MED ORDER — PHENYLEPHRINE 80 MCG/ML (10ML) SYRINGE FOR IV PUSH (FOR BLOOD PRESSURE SUPPORT)
80.0000 ug | PREFILLED_SYRINGE | INTRAVENOUS | Status: DC | PRN
  Filled 2023-11-18: qty 10

## 2023-11-18 MED ORDER — LACTATED RINGERS IV SOLN
500.0000 mL | INTRAVENOUS | Status: DC | PRN
  Administered 2023-11-18: 1000 mL via INTRAVENOUS

## 2023-11-18 MED ORDER — FENTANYL-BUPIVACAINE-NACL 0.5-0.125-0.9 MG/250ML-% EP SOLN
12.0000 mL/h | EPIDURAL | Status: DC | PRN
  Administered 2023-11-19: 12 mL/h via EPIDURAL
  Filled 2023-11-18: qty 250

## 2023-11-18 MED ORDER — EPHEDRINE 5 MG/ML INJ
10.0000 mg | INTRAVENOUS | Status: DC | PRN
Start: 2023-11-18 — End: 2023-11-19

## 2023-11-18 MED ORDER — OXYCODONE-ACETAMINOPHEN 5-325 MG PO TABS: 2.0000 | ORAL_TABLET | ORAL | Status: DC | PRN

## 2023-11-18 NOTE — H&P (Signed)
 OBSTETRIC ADMISSION HISTORY AND PHYSICAL  Lori Lloyd is a 28 y.o. female [phone] with IUP at [redacted]w[redacted]d (dated by L/9, Estimated Date of Delivery: 11/23/23) presenting for decreased fetal movement. Reports decreased fetal movement -- no movement since 7am today.   She reports No LOF, no VB, no blurry vision, headaches or peripheral edema, and RUQ pain.    She plans on breast feeding. She request Nexplanon for birth control.  She received her prenatal care at [facility]   Prenatal History/Complications:  - Hx vasa previa - Hx C/S (VB from previa), vaginal delivery with 1st - THC use - Tobacco use  Past Medical History: Past Medical History:  Diagnosis Date   Asthma    Depression    Eczema    Fracture    ring finger right hand    Past Surgical History: Past Surgical History:  Procedure Laterality Date   CESAREAN SECTION N/A 06/26/2016   Procedure: CESAREAN SECTION;  Surgeon: Rik Chasten, MD;  Location: [facility] BIRTHING SUITES;  Service: Obstetrics;  Laterality: N/A;   INDUCED ABORTION     WISDOM TOOTH EXTRACTION      Obstetrical History: OB History     Gravida  6   Para  2   Term  1   Preterm  1   AB  3   Living  2      SAB  0   IAB  3   Ectopic  0   Multiple  0   Live Births  2           Social History Social History   Socioeconomic History   Marital status: Single    Spouse name: Not on file   Number of children: Not on file   Years of education: Not on file   Highest education level: Not on file  Occupational History   Not on file  Tobacco Use   Smoking status: Every Day    Current packs/day: 0.25    Average packs/day: 0.3 packs/day for 0.5 years (0.1 ttl pk-yrs)    Types: Cigarettes    Passive exposure: Never   Smokeless tobacco: Never   Tobacco comments:    with preg  Vaping Use   Vaping status: Never Used  Substance and Sexual Activity   Alcohol use: No   Drug use: No    Types: Marijuana    Comment: prior to preg   Sexual  activity: Yes    Birth control/protection: None    Comment: two weeks ago  Other Topics Concern   Not on file  Social History Narrative   Not on file   Social Drivers of Health   Financial Resource Strain: Not on file  Food Insecurity: Not on file  Transportation Needs: Not on file  Physical Activity: Not on file  Stress: Not on file  Social Connections: Not on file    Family History: Family History  Problem Relation Age of Onset   Diabetes Maternal Grandmother    Hypertension Maternal Grandmother    Hearing loss Neg Hx     Allergies: Allergies  Allergen Reactions   Shellfish Allergy Anaphylaxis    Medications Prior to Admission  Medication Sig Dispense Refill Last Dose/Taking   Blood Pressure Monitoring (BLOOD PRESSURE KIT) DEVI 1 kit by Does not apply route once a week. Check Blood Pressure regularly and record readings into the Babyscripts App.  Large Cuff.  DX O90.0 (Patient not taking: Reported on 11/02/2023) 1 each 0  nicotine  (NICODERM CQ  - DOSED IN MG/24 HOURS) 21 mg/24hr patch Place 1 patch (21 mg total) onto the skin daily. 28 patch 2    ondansetron  (ZOFRAN ) 4 MG tablet Take 1 tablet (4 mg total) by mouth every 8 (eight) hours as needed for nausea or vomiting. (Patient not taking: Reported on 11/02/2023) 30 tablet 0    Prenatal Vit-Fe Fumarate-FA (PRENATAL MULTIVITAMIN) TABS tablet Take 1 tablet by mouth daily at 12 noon. (Patient not taking: Reported on 11/02/2023) 30 tablet 12      Review of Systems  All systems reviewed and negative except as stated in HPI.  Blood pressure (!) 121/56, pulse 86, temperature 97.9 F (36.6 C), temperature source Oral, resp. rate 18, height 5\' 1"  (1.549 m), weight 84.8 kg, last menstrual period 02/16/2023, SpO2 100%, unknown if currently breastfeeding. General appearance: alert, cooperative, and appears stated age Lungs: breathing comfortably on room air Heart: regular rate Abdomen: soft, non-tender; gravid Extremities: no  edema of bilateral lower extremities Presentation: cephalic Fetal monitoringBaseline: 145 bpm, moderate variability, accels, no decels Uterine activity Irregular Dilation: 2 Effacement (%): 50 Station: -3 Exam by:: Dr.Haunani Dickard   Prenatal labs: ABO, Rh: --/--/O POS (05/30 1920) Antibody: NEG (05/30 1920) Rubella: Equivocal (11/15 0000) RPR: Non Reactive (03/12 0835)  HBsAg: Negative (11/15 0000)  HIV: Non Reactive (03/12 0835)  GBS: Negative/-- (05/07 0951)  2 hr Glucola wnl Genetic screening low risk Anatomy US  wnl Last US : At [redacted]w[redacted]d - cephalic presentation, EFW 2477g (55 %tile), AC 91%tile  Prenatal Transfer Tool  Maternal Diabetes: No Genetic Screening: Normal Maternal Ultrasounds/Referrals: Normal Fetal Ultrasounds or other Referrals:  Referred to Materal Fetal Medicine  Maternal Substance Abuse:  Yes:  Type: Smoker, Marijuana Significant Maternal Medications:  None Significant Maternal Lab Results:  Group B Strep negative Number of Prenatal Visits:greater than 3 verified prenatal visits Other Comments:  None  Results for orders placed or performed during the hospital encounter of 11/18/23 (from the past 24 hours)  Type and screen [facility]   Collection Time: 11/18/23  7:20 PM  Result Value Ref Range   ABO/RH(D) O POS    Antibody Screen NEG    Sample Expiration      11/21/2023,2359 Performed at [facility], [address] [place], [place] [address]   CBC   Collection Time: 11/18/23  7:26 PM  Result Value Ref Range   WBC 9.7 4.0 - 10.5 K/uL   RBC 3.24 (L) 3.87 - 5.11 MIL/uL   Hemoglobin 8.6 (L) 12.0 - 15.0 g/dL   HCT 60.4 (L) 54.0 - 98.1 %   MCV 87.7 80.0 - 100.0 fL   MCH 26.5 26.0 - 34.0 pg   MCHC 30.3 30.0 - 36.0 g/dL   RDW 19.1 47.8 - 29.5 %   Platelets 170 150 - 400 K/uL   nRBC 0.3 (H) 0.0 - 0.2 %    Patient Active Problem List   Diagnosis Date Noted   Decreased fetal movement 11/18/2023   History of preterm delivery,  currently pregnant in second trimester 07/26/2023   Marijuana use during pregnancy 07/26/2023   Tobacco use affecting pregnancy, antepartum 07/26/2023   Supervision of other high risk pregnancy, antepartum 07/12/2023   History of cesarean section 06/29/2016   History of placenta previa 06/09/2016   Depressive disorder 02/18/2013    Assessment/Plan:  KIARALIZ RAFUSE is a 28 y.o. [id number] at [redacted]w[redacted]d here for induction in setting of decreased fetal movement. TOLAC with prior vaginal delivery followed  by C/S for vasa previa.  #Labor: Discussed IOL in detail w pt. Discussed FB with pitocin  if FB not out in 4 hours. Discussed R/B/A of FB placement, and verbal consent obtained. Fentanyl  given for pre-procedure analgesia. Placed Cook without difficulty and balloon filled with 60 cc of saline. Mom and babe tolerated well. Discussed role for AROM once FB out. #Pain: Per pt request #FWB: Cat I tracing, BPP 8/8 #ID:  GBS neg #MOF: Breast #MOC: Nexplanon #Circ:  N/A  #TOLAC: Hx C/S for vasa previa, had prior SVD  TOLAC consent signed, anticipate VBAC  #Tobacco use disorder: Continue Nicotine patches  Authur Leghorn, MD OB Fellow, Faculty Practice [facility], Center for [facility] 11/18/2023 10:22 PM

## 2023-11-18 NOTE — Plan of Care (Signed)

## 2023-11-18 NOTE — MAU Provider Note (Signed)
 History     CSN: [id number]  Arrival date and time: 11/18/23 1705   None     Chief Complaint  Patient presents with   Decreased Fetal Movement   HPI  Lori Lloyd is a 28 y.o. female [phone] @ [redacted]w[redacted]d here with decreased fetal movement over the last few days. She reports less than normal movement throughout today.   History of preterm delivery via C/s due to bleeding and Vasa previa. Desires TOLAC- consent signed.   OB History     Gravida  6   Para  2   Term  1   Preterm  1   AB  3   Living  2      SAB  0   IAB  3   Ectopic  0   Multiple  0   Live Births  2           Past Medical History:  Diagnosis Date   Asthma    Depression    Eczema    Fracture    ring finger right hand    Past Surgical History:  Procedure Laterality Date   CESAREAN SECTION N/A 06/26/2016   Procedure: CESAREAN SECTION;  Surgeon: Rik Chasten, MD;  Location: [facility] BIRTHING SUITES;  Service: Obstetrics;  Laterality: N/A;   INDUCED ABORTION     WISDOM TOOTH EXTRACTION      Family History  Problem Relation Age of Onset   Diabetes Maternal Grandmother    Hypertension Maternal Grandmother    Hearing loss Neg Hx     Social History   Tobacco Use   Smoking status: Every Day    Current packs/day: 0.25    Average packs/day: 0.3 packs/day for 0.5 years (0.1 ttl pk-yrs)    Types: Cigarettes    Passive exposure: Never   Smokeless tobacco: Never   Tobacco comments:    with preg  Vaping Use   Vaping status: Never Used  Substance Use Topics   Alcohol use: No   Drug use: No    Types: Marijuana    Comment: prior to preg    Allergies:  Allergies  Allergen Reactions   Shellfish Allergy Anaphylaxis    Medications Prior to Admission  Medication Sig Dispense Refill Last Dose/Taking   Blood Pressure Monitoring (BLOOD PRESSURE KIT) DEVI 1 kit by Does not apply route once a week. Check Blood Pressure regularly and record readings into the Babyscripts App.  Large  Cuff.  DX O90.0 (Patient not taking: Reported on 11/02/2023) 1 each 0    nicotine (NICODERM CQ - DOSED IN MG/24 HOURS) 21 mg/24hr patch Place 1 patch (21 mg total) onto the skin daily. 28 patch 2    ondansetron  (ZOFRAN ) 4 MG tablet Take 1 tablet (4 mg total) by mouth every 8 (eight) hours as needed for nausea or vomiting. (Patient not taking: Reported on 11/02/2023) 30 tablet 0    Prenatal Vit-Fe Fumarate-FA (PRENATAL MULTIVITAMIN) TABS tablet Take 1 tablet by mouth daily at 12 noon. (Patient not taking: Reported on 11/02/2023) 30 tablet 12    No results found for this or any previous visit (from the past 48 hours).   Review of Systems  Gastrointestinal:  Negative for abdominal pain.  Genitourinary:  Negative for vaginal bleeding.   Physical Exam   Blood pressure 104/60, pulse 100, temperature 98.5 F (36.9 C), resp. rate 18, height 5\' 1"  (1.549 m), weight 84.8 kg, last menstrual period 02/16/2023, SpO2 99%, unknown if currently breastfeeding.  Physical  Exam Constitutional:      General: She is not in acute distress.    Appearance: Normal appearance. She is not ill-appearing, toxic-appearing or diaphoretic.  Neurological:     Mental Status: She is alert and oriented to person, place, and time.  Psychiatric:        Behavior: Behavior normal.    Fetal Tracing: Baseline: 135 bpm Variability: /minimal- moderate  Accelerations: 10x10 Decelerations: None  Toco: UI  MAU Course  Procedures  MDM  Decreased fetal movement at [redacted]w[redacted]d with Category 2 tracing. Called Dr. Hubert Madden for admission to labor and delivery, Dr. Aquilla Knapp requests BPP prior to transfer.  BPP 8/10, Per. Dr. Hubert Madden, ok for patient to transfer up to L&D    1. Decreased fetal movement during pregnancy, antepartum, single or unspecified fetus   2. [redacted] weeks gestation of pregnancy   3. Non-reactive NST (non-stress test)      P:  Admit to labor GBS negative TOLAC- consent signed    Lori Hales,  NP 11/18/2023 6:45 PM

## 2023-11-18 NOTE — MAU Note (Signed)

## 2023-11-18 NOTE — MAU Note (Signed)
 Lori Lloyd is a 28 y.o. at [redacted]w[redacted]d here in MAU reporting: She had not felt baby move since about 7am. Stated she did start to feel her move while she was in the waiting room. Reports some cramping that just started. Denies any vag bleeding or discharge.   LMP:  Onset of complaint: 7am Pain score: 4 Vitals:   11/18/23 1719  BP: 102/65  Pulse: (!) 107  Resp: 18  Temp: 98.5 F (36.9 C)     FHT: 142  Lab orders placed from triage:

## 2023-11-19 ENCOUNTER — Inpatient Hospital Stay (HOSPITAL_COMMUNITY): Admitting: Anesthesiology

## 2023-11-19 ENCOUNTER — Encounter (HOSPITAL_COMMUNITY): Payer: Self-pay | Admitting: Obstetrics and Gynecology

## 2023-11-19 DIAGNOSIS — Z3A39 39 weeks gestation of pregnancy: Secondary | ICD-10-CM

## 2023-11-19 DIAGNOSIS — O368130 Decreased fetal movements, third trimester, not applicable or unspecified: Secondary | ICD-10-CM

## 2023-11-19 DIAGNOSIS — O99334 Smoking (tobacco) complicating childbirth: Secondary | ICD-10-CM

## 2023-11-19 DIAGNOSIS — O34211 Maternal care for low transverse scar from previous cesarean delivery: Secondary | ICD-10-CM | POA: Diagnosis not present

## 2023-11-19 DIAGNOSIS — O99324 Drug use complicating childbirth: Secondary | ICD-10-CM

## 2023-11-19 LAB — RPR: RPR Ser Ql: NONREACTIVE

## 2023-11-19 MED ORDER — LIDOCAINE HCL (PF) 1 % IJ SOLN
INTRAMUSCULAR | Status: DC | PRN
  Administered 2023-11-19: 4 mL via EPIDURAL
  Administered 2023-11-19: 5 mL via EPIDURAL

## 2023-11-19 MED ORDER — ACETAMINOPHEN 500 MG PO TABS
1000.0000 mg | ORAL_TABLET | Freq: Three times a day (TID) | ORAL | Status: DC
  Administered 2023-11-19 – 2023-11-20 (×3): 1000 mg via ORAL
  Filled 2023-11-19 (×3): qty 2

## 2023-11-19 MED ORDER — SENNOSIDES-DOCUSATE SODIUM 8.6-50 MG PO TABS
2.0000 | ORAL_TABLET | Freq: Every day | ORAL | Status: DC
  Administered 2023-11-20: 2 via ORAL
  Filled 2023-11-19: qty 2

## 2023-11-19 MED ORDER — COCONUT OIL OIL: 1.0000 | TOPICAL_OIL | Status: DC | PRN

## 2023-11-19 MED ORDER — ONDANSETRON HCL 4 MG PO TABS: 4.0000 mg | ORAL_TABLET | ORAL | Status: DC | PRN

## 2023-11-19 MED ORDER — ONDANSETRON HCL 4 MG/2ML IJ SOLN
4.0000 mg | INTRAMUSCULAR | Status: DC | PRN
Start: 2023-11-19 — End: 2023-11-20

## 2023-11-19 MED ORDER — BUPIVACAINE HCL (PF) 0.25 % IJ SOLN: INTRAMUSCULAR | Status: DC | PRN

## 2023-11-19 MED ORDER — ZOLPIDEM TARTRATE 5 MG PO TABS: 5.0000 mg | ORAL_TABLET | Freq: Every evening | ORAL | Status: DC | PRN

## 2023-11-19 MED ORDER — OXYTOCIN-SODIUM CHLORIDE 30-0.9 UT/500ML-% IV SOLN
1.0000 m[IU]/min | INTRAVENOUS | Status: DC
  Administered 2023-11-19: 2 m[IU]/min via INTRAVENOUS
  Filled 2023-11-19: qty 500

## 2023-11-19 MED ORDER — OXYCODONE HCL 5 MG PO TABS: 10.0000 mg | ORAL_TABLET | Freq: Four times a day (QID) | ORAL | Status: DC | PRN

## 2023-11-19 MED ORDER — SIMETHICONE 80 MG PO CHEW: 80.0000 mg | CHEWABLE_TABLET | ORAL | Status: DC | PRN

## 2023-11-19 MED ORDER — WITCH HAZEL-GLYCERIN EX PADS
1.0000 | MEDICATED_PAD | CUTANEOUS | Status: DC | PRN
  Administered 2023-11-19: 1 via TOPICAL

## 2023-11-19 MED ORDER — PRENATAL MULTIVITAMIN CH
1.0000 | ORAL_TABLET | Freq: Every day | ORAL | Status: DC
Start: 2023-11-19 — End: 2023-11-20
  Administered 2023-11-19: 1 via ORAL
  Filled 2023-11-19: qty 1

## 2023-11-19 MED ORDER — OXYCODONE HCL 5 MG PO TABS: 5.0000 mg | ORAL_TABLET | Freq: Four times a day (QID) | ORAL | Status: DC | PRN

## 2023-11-19 MED ORDER — DIPHENHYDRAMINE HCL 25 MG PO CAPS: 25.0000 mg | ORAL_CAPSULE | Freq: Four times a day (QID) | ORAL | Status: DC | PRN

## 2023-11-19 MED ORDER — IBUPROFEN 800 MG PO TABS
800.0000 mg | ORAL_TABLET | Freq: Three times a day (TID) | ORAL | Status: DC
  Administered 2023-11-19 – 2023-11-20 (×4): 800 mg via ORAL
  Filled 2023-11-19 (×4): qty 1

## 2023-11-19 MED ORDER — MEDROXYPROGESTERONE ACETATE 150 MG/ML IM SUSP: 150.0000 mg | INTRAMUSCULAR | Status: DC | PRN

## 2023-11-19 MED ORDER — BENZOCAINE-MENTHOL 20-0.5 % EX AERO
1.0000 | INHALATION_SPRAY | CUTANEOUS | Status: DC | PRN
  Administered 2023-11-19: 1 via TOPICAL
  Filled 2023-11-19: qty 56

## 2023-11-19 MED ORDER — TERBUTALINE SULFATE 1 MG/ML IJ SOLN: 0.2500 mg | Freq: Once | INTRAMUSCULAR | Status: DC | PRN

## 2023-11-19 MED ORDER — DIBUCAINE (PERIANAL) 1 % EX OINT: 1.0000 | TOPICAL_OINTMENT | CUTANEOUS | Status: DC | PRN

## 2023-11-19 NOTE — Anesthesia Preprocedure Evaluation (Addendum)
 Anesthesia Evaluation  Patient identified by MRN, date of birth, ID band Patient awake    Reviewed: Allergy & Precautions, H&P , Patient's Chart, lab work & pertinent test results  Airway Mallampati: III  TM Distance: >3 FB     Dental no notable dental hx. (+) Teeth Intact   Pulmonary asthma , Current Smoker, former smoker   Pulmonary exam normal        Cardiovascular negative cardio ROS Normal cardiovascular exam Rhythm:Regular     Neuro/Psych  PSYCHIATRIC DISORDERS  Depression    negative neurological ROS     GI/Hepatic Neg liver ROS,GERD  Medicated and Controlled,,  Endo/Other  Obesity  Renal/GU negative Renal ROS  negative genitourinary   Musculoskeletal negative musculoskeletal ROS (+)    Abdominal  (+) + obese  Peds  Hematology  (+) Blood dyscrasia, anemia   Anesthesia Other Findings   Reproductive/Obstetrics (+) Pregnancy Hx/o previous C/Section                             Anesthesia Physical Anesthesia Plan  ASA: 2  Anesthesia Plan: Epidural   Post-op Pain Management:    Induction:   PONV Risk Score and Plan:   Airway Management Planned: Natural Airway  Additional Equipment: Fetal Monitoring and None  Intra-op Plan:   Post-operative Plan:   Informed Consent: I have reviewed the patients History and Physical, chart, labs and discussed the procedure including the risks, benefits and alternatives for the proposed anesthesia with the patient or authorized representative who has indicated his/her understanding and acceptance.       Plan Discussed with: Anesthesiologist  Anesthesia Plan Comments:         Anesthesia Quick Evaluation

## 2023-11-19 NOTE — Discharge Summary (Signed)
 Postpartum Discharge Summary  Date of Service updated***     Patient Name: Lori Lloyd DOB: 1995-07-09 MRN: [id number]  Date of admission: 11/18/2023 Delivery date:11/19/2023 Delivering provider: Maud Sorenson Date of discharge: 11/19/2023  Admitting diagnosis: Decreased fetal movement [O36.8190] Intrauterine pregnancy: [redacted]w[redacted]d     Secondary diagnosis:  Principal Problem:   SVD (spontaneous vaginal delivery) Active Problems:   History of placenta previa   History of cesarean section   Supervision of other high risk pregnancy, antepartum   History of preterm delivery, currently pregnant in second trimester   Marijuana use during pregnancy   Tobacco use affecting pregnancy, antepartum   Decreased fetal movement  Additional problems: ***    Discharge diagnosis: Term Pregnancy Delivered                                              Post partum procedures:{Postpartum procedures:23558} Augmentation: AROM, Pitocin , and IP Foley Complications: None  Hospital course: Induction of Labor With Vaginal Delivery   28 y.o. yo [id number] at [redacted]w[redacted]d was admitted to the hospital 11/18/2023 for induction of labor.  Indication for induction: DFM.  Patient had an labor course complicated by none. TOLAC. Membrane Rupture Time/Date: 11:05 PM,11/18/2023  Delivery Method:Vaginal, Spontaneous Operative Delivery:N/A Episiotomy: None Lacerations:  1st degree Details of delivery can be found in separate delivery note.  Patient had a postpartum course complicated by***. Patient is discharged home 11/19/23.  Newborn Data: Birth date:11/19/2023 Birth time:7:48 AM Gender:Female Living status:Living Apgars:9 ,9  Weight:   Magnesium Sulfate received: No BMZ received: No Rhophylac:No MMR:No T-DaP:Given prenatally Flu: N/A RSV Vaccine received: No Transfusion:{Transfusion received:30440034}  Immunizations received: Immunization History  Administered Date(s) Administered   Tdap 10/04/2012,  06/23/2016    Physical exam  Vitals:   11/19/23 0700 11/19/23 0730 11/19/23 0755 11/19/23 0800  BP: 117/63 (!) 122/58 (!) 95/53   Pulse: 81 88 93   Resp: 16 16 16    Temp: 99.2 F (37.3 C)   99.3 F (37.4 C)  TempSrc: Oral   Oral  SpO2:      Weight:      Height:       General: {Exam; general:21111117} Lochia: {Desc; appropriate/inappropriate:30686::"appropriate"} Uterine Fundus: {Desc; firm/soft:30687} Incision: {Exam; incision:21111123} DVT Evaluation: {Exam; dvt:2111122} Labs: Lab Results  Component Value Date   WBC 9.7 11/18/2023   HGB 8.6 (L) 11/18/2023   HCT 28.4 (L) 11/18/2023   MCV 87.7 11/18/2023   PLT 170 11/18/2023      Latest Ref Rng & Units 04/22/2023   12:10 PM  CMP  Glucose 70 - 99 mg/dL 75   BUN 6 - 20 mg/dL 11   Creatinine 1.47 - 1.00 mg/dL 8.29   Sodium 562 - 130 mmol/L 134   Potassium 3.5 - 5.1 mmol/L 4.0   Chloride 98 - 111 mmol/L 103   CO2 22 - 32 mmol/L 23   Calcium  8.9 - 10.3 mg/dL 86.5   Total Protein 6.5 - 8.1 g/dL 7.4   Total Bilirubin 0.3 - 1.2 mg/dL 0.5   Alkaline Phos 38 - 126 U/L 47   AST 15 - 41 U/L 20   ALT 0 - 44 U/L 22    Edinburgh Score:     No data to display         No data recorded  After visit meds:  Allergies as of  11/19/2023       Reactions   Shellfish Allergy Anaphylaxis     Med Rec must be completed prior to using this [facility]***        Discharge home in stable condition Infant Feeding: {Baby feeding:23562} Infant Disposition:{CHL IP OB HOME WITH UJWJXB:14782} Discharge instruction: per After Visit Summary and Postpartum booklet. Activity: Advance as tolerated. Pelvic rest for 6 weeks.  Diet: {OB NFAO:13086578} Future Appointments: Future Appointments  Date Time Provider Department Center  11/23/2023 10:15 AM Noreene Bearded, PA [facility] None   Follow up Visit:  Message sent to [facility] 5/31  Please schedule this patient for a In person postpartum visit in 6 weeks with the following provider:  Any provider. Additional Postpartum F/U:None  High risk pregnancy complicated by: hx vasa previa Delivery mode:  Vaginal, Spontaneous Anticipated Birth Control:  Nexplanon IP   11/19/2023 Maud Sorenson, MD

## 2023-11-19 NOTE — Lactation Note (Signed)
 This note was copied from a baby's chart. Lactation Consultation Note  Patient Name: Lori Lloyd [id number] Date: 11/19/2023 Age:30 hours Reason for consult: Initial assessment;Term.   LC assisted with latch, MOB latched infant on her left breast using the cross cradle hold with pillow support, infant latched with depth and sustained her latch, infant was still breastfeeding after 18 minutes when LC left the room. MOB will continue to breastfeed infant by cues,on demand, 8+ times within 24 hours, skin to skin. MOB knows if infant is breastfeeding on one breast and still exhibiting hunger cues, she knows to latch infant on the second breast during the same feeding. LC discussed hunger cues and signs of satiety in infant. MOB knows to continue to call for latch assistance if needed. MOB requested hand pump, MOB fitted with 24 mm breast flange. MOB has Motif DEBP at home. Infant had one stool since birth. LC discussed importance of rest, meals and hydration. MOB was made aware of O/P services, breastfeeding support groups, community resources, and our phone # for post-discharge questions.    Maternal Data Has patient been taught Hand Expression?: Yes Does the patient have breastfeeding experience prior to this delivery?: Yes How long did the patient breastfeed?: Per MOB, she briefly breast1st child for 2 days and 2nd child for 3 months.  Feeding Mother's Current Feeding Choice: Breast Milk  LATCH Score Latch: Grasps breast easily, tongue down, lips flanged, rhythmical sucking.  Audible Swallowing: A few with stimulation  Type of Nipple: Everted at rest and after stimulation  Comfort (Breast/Nipple): Soft / non-tender  Hold (Positioning): Assistance needed to correctly position infant at breast and maintain latch.  LATCH Score: 8   Lactation Tools Discussed/Used    Interventions Interventions: Breast feeding basics reviewed;Assisted with latch;Skin to skin;Reverse pressure;Breast  compression;Adjust position;Support pillows;Position options;Education;DEBP;CDC milk storage guidelines;CDC Guidelines for Breast Pump Cleaning;LC Services brochure;Guidelines for Milk Supply and Pumping Schedule Handout  Discharge Pump: DEBP;Manual;Personal (MOB has hand pump and Motif DEBP)  Consult Status Consult Status: Follow-up Date: 11/20/23 Follow-up type: In-patient    Lori Lloyd 11/19/2023, 8:38 PM

## 2023-11-19 NOTE — Progress Notes (Signed)
 LABOR PROGRESS NOTE  Patient Name: Lori Lloyd, female   DOB: 18-Jun-1996, 28 y.o.  MRN: [id number]  Feeling some pressure with epidural. SROM prior to epidural. Amenable to check. Cervix 7/70/-2. Forebag palpated. R/B/A of AROM discussed with patient, and verbal consent obtained. Babe's head found to be well-applied. Obtained moderate amount of light meconium-stained fluid. Mom and babe tolerated well. Cat I. Continue to titrate pit for adequate contractions. Anticipate SVD.  Maud Sorenson, MD

## 2023-11-19 NOTE — Anesthesia Procedure Notes (Signed)
 Epidural Patient location during procedure: OB Start time: 11/19/2023 1:31 AM End time: 11/19/2023 1:40 AM  Staffing Anesthesiologist: Tura Gaines, MD Performed: anesthesiologist   Preanesthetic Checklist Completed: patient identified, IV checked, site marked, risks and benefits discussed, surgical consent, monitors and equipment checked, pre-op evaluation and timeout performed  Epidural Patient position: sitting Prep: DuraPrep and site prepped and draped Patient monitoring: continuous pulse ox and blood pressure Approach: midline Location: L3-L4 Injection technique: LOR air  Needle:  Needle type: Tuohy  Needle gauge: 17 G Needle length: 9 cm and 9 Needle insertion depth: 8 cm Catheter type: closed end flexible Catheter size: 19 Gauge Catheter at skin depth: 13 cm Test dose: negative and Other  Assessment Events: blood not aspirated, no cerebrospinal fluid, injection not painful, no injection resistance, no paresthesia and negative IV test  Additional Notes Timeout performed. Patient sedated. Relevant anatomy ID'd using US . Incremental 2-5ml injection of LA with frequent aspiration. Patient tolerated procedure well. Reason for block:procedure for pain

## 2023-11-20 MED ORDER — LIDOCAINE HCL 1 % IJ SOLN: 0.0000 mL | Freq: Once | INTRAMUSCULAR | Status: DC | PRN

## 2023-11-20 MED ORDER — ACETAMINOPHEN 500 MG PO TABS: 1000.0000 mg | ORAL_TABLET | Freq: Three times a day (TID) | ORAL | 0 refills | Status: DC | PRN

## 2023-11-20 MED ORDER — IBUPROFEN 800 MG PO TABS: 800.0000 mg | ORAL_TABLET | Freq: Three times a day (TID) | ORAL | 0 refills | Status: DC | PRN

## 2023-11-20 MED ORDER — SENNOSIDES-DOCUSATE SODIUM 8.6-50 MG PO TABS: 2.0000 | ORAL_TABLET | Freq: Every evening | ORAL | 0 refills | Status: DC | PRN

## 2023-11-20 MED ORDER — ETONOGESTREL 68 MG ~~LOC~~ IMPL: 68.0000 mg | DRUG_IMPLANT | Freq: Once | SUBCUTANEOUS | Status: DC

## 2023-11-20 NOTE — Clinical Social Work Maternal (Signed)
 CLINICAL SOCIAL WORK MATERNAL/CHILD NOTE  Patient Details  Name: Lori Lloyd MRN: [id number] Date of Birth: 02-01-1996  Date:  11/20/2023  Clinical Social Worker Initiating Note:  Eliazar Gross, LCSWA Date/Time: Initiated:  11/20/23/1150     Child's Name:  Lori Lloyd   Biological Parents:  Mother, Father (FOB: Ladell Pickett, DOB: 01/29/1990)   Need for Interpreter:      Reason for Referral:  Behavioral Health Concerns, Current Substance Use/Substance Use During Pregnancy     Address:  [address] [place] [address]    Phone number: [phone] (temporary)  [phone] (phone currently broken)  Additional phone number:   Household Members/Support Persons (HM/SP):   Household Member/Support Person 1, Household Member/Support Person 2   HM/SP Name Relationship DOB or Age  HM/SP -1 Threasa Flood Daughter 10/03/2012  HM/SP -2 Landen Elmore Hageman Son 06/26/2016  HM/SP -3        HM/SP -4        HM/SP -5        HM/SP -6        HM/SP -7        HM/SP -8          Natural Supports (not living in the home):  Friends, Immediate Family   Professional Supports: Case Research officer, political party   Employment: Unemployed   Type of Work:     Education:  Some Materials engineer arranged:    Surveyor, quantity Resources:  [employer]   Other Resources:  Sales executive   (WIC referral placed during consult)   Cultural/Religious Considerations Which May Impact Care:  Per [employer], MOB identifies as [employer]  Strengths:  Home prepared for child  , Merchandiser, retail, Ability to meet basic needs     Psychotropic Medications:         Pediatrician:    [facility] area  Pediatrician List:   [facility]  [place]    [place]    [place] [facility]      Pediatrician Fax Number:    Risk Factors/Current Problems:  Substance Use  , Basic Needs  , Mental Health Concerns      Cognitive State:  Linear Thinking  , Able to Concentrate  , Alert  , Goal Oriented     Mood/Affect:  Calm  , Comfortable  , Relaxed  , Interested     CSW Assessment: CSW was consulted due to marijuana use during pregnancy and history of depression and anxiety. CSW met with MOB at bedside to complete assessment. When CSW entered room, MOB was observed sitting on couch holding infant. CSW introduced self and explained reason for consult. MOB presented as calm, was agreeable to consult and remained engaged throughout encounter.   CSW inquired about demographic information listed on file. MOB confirmed her address as correct but stated that she is temporarily using her daughter's phone number due to her phone recently breaking. MOB reports she can be contacted at [phone] temporarily.   CSW inquired how MOB is feeling emotionally since infant's arrival. MOB shared she is feeling "good", and "so excited (infant) is here." CSW inquired about MOB's mental health history. MOB acknowledged a history of depression and anxiety, stating she was first diagnosed after her daughter was born in 2014. CSW inquired about recent symptoms. MOB reports endorsing symptoms of both depression and anxiety during pregnancy, marked by feeling financially stressed (worried how she will pay  bills without current employment), concerned about how having another baby will effect her other 2 children, having limited support, and feeling worried about baby's health due to nicotine  use during pregnancy. MOB reports she was prescribed Zoloft  about 5 months ago but has not yet started taking the medication. MOB reports she has not taken psychotropic medication in the past but plans to fill the prescription once discharged. Per chart review, an [facility]BH referral has been placed. MOB reports a history of postpartum depression after the birth of her other 2 children. MOB recalled having limited support after the birth of her daughter and  feeling financially stressed after the birth of her son and recalls feelings of sadness, overwhelm and worry postpartum. CSW inquired about current supports. MOB identified her sister and best friend as supports. CSW assessed for safety. MOB denied current SI/HI/DV.   CSW provided education regarding the baby blues period vs. perinatal mood disorders, discussed treatment and gave resources for mental health follow up if concerns arise.  CSW recommends self-evaluation during the postpartum time period using the New Mom Checklist from Postpartum Progress and encouraged MOB to contact a medical professional if symptoms are noted at any time.    CSW inquired about resource needs. MOB reports she had a pregnancy care coordinator who accompanied her to OBGYN visits during her pregnancy and has been connected to the [employer]. MOB reports she has received some community assistance for rental assistance but expressed interest in additional resources, which CSW provided. CSW also placed referral for [facility] per patient request. MOB denied transportation barriers.   MOB reports she has all needed items for infant, a car seat and clothing for infant but is in need of a bassinet and additional diapers and wipes. CSW provided pack n play. CSW reviewed resources for diapers and wipes (WIC and [employer]). MOB reports she has an upcoming appointment with [employer]. CSW assisted MOB in looking up appointment, which is tomorrow, 11/21/23 at 1:15pm. CSW provided review of Sudden Infant Death Syndrome (SIDS) precautions.    CSW informed MOB about hospital drug screen policy due to reported marijuana use during pregnancy. CSW explained that infant's UDS resulted positive for THC. CSW explained that a CPS report would be made due to infant testing positive for THC and infant's CDS would continue to be monitored. MOB expressed understanding. CSW inquired about substance use during pregnancy. MOB  acknowledged that she smoked marijuana to help her have an appetite during pregnancy. MOB reports her last use of THC was 2 weeks ago. MOB denied using other illicit substances during pregnancy. CSW inquired about prior CPS involvement. MOB reports a CPS case was opened after the birth of her son due to her son testing positive for THC at birth. MOB reports a CPS case was also opened regarding her daughter last school year (2024) and the case was closed after about 4 weeks with no abuse or neglect substantiated.   CSW placed call to [facility] Department of Social Services CPS line and made CPS report to CPS  due to CPS social worker Justine Oms due to infant's UDS resulting positive for THC.  CSW identifies no further need for intervention and no barriers to discharge at this time.   CSW Plan/Description:  No Further Intervention Required/No Barriers to Discharge, Sudden Infant Death Syndrome (SIDS) Education, Perinatal Mood and Anxiety Disorder (PMADs) Education, Hospital Drug Screen Policy Information, Other Information/Referral to [facility], Child Protective Service Report  , CSW  Will Continue to Monitor Umbilical Cord Tissue Drug Screen Results and Make Report if Warranted    Jaidon Ellery K Derinda Bartus, LCSWA 11/20/2023, 11:57 AM

## 2023-11-22 NOTE — Anesthesia Postprocedure Evaluation (Signed)
 Anesthesia Post Note  Patient: Tree surgeon  Procedure(s) Performed: AN AD HOC LABOR EPIDURAL     Patient location during evaluation: Mother Baby Anesthesia Type: Epidural Level of consciousness: awake and alert Pain management: pain level controlled Vital Signs Assessment: post-procedure vital signs reviewed and stable Respiratory status: spontaneous breathing, nonlabored ventilation and respiratory function stable Cardiovascular status: stable Postop Assessment: no headache, no backache and epidural receding Anesthetic complications: no   No notable events documented.  Last Vitals: There were no vitals filed for this visit.  Last Pain: There were no vitals filed for this visit. Pain Goal:                   Melvenia Stabs

## 2023-11-23 ENCOUNTER — Encounter: Admitting: Family Medicine

## 2023-11-29 ENCOUNTER — Telehealth (HOSPITAL_COMMUNITY): Payer: Self-pay | Admitting: *Deleted

## 2023-11-29 NOTE — Telephone Encounter (Signed)
 11/29/2023  Name: NAYZETH ALTMAN MRN: [id number] DOB: 09-29-1995  Reason for Call:  Transition of Care Hospital Discharge Call  Contact Status: Patient Contact Status: Unable to contact ("the mailbox is full and cannot accept any messages")  Language assistant needed:          Follow-Up Questions:    Dimple Francis Postnatal Depression Scale:  In the Past 7 Days:    PHQ2-9 Depression Scale:     Discharge Follow-up:    Post-discharge interventions: NA  Pearlie Bougie, RN 11/29/2023 12:10

## 2023-11-30 ENCOUNTER — Inpatient Hospital Stay (HOSPITAL_COMMUNITY): Admission: RE | Admit: 2023-11-30 | Source: Home / Self Care | Admitting: Obstetrics and Gynecology

## 2023-11-30 ENCOUNTER — Other Ambulatory Visit: Payer: Self-pay

## 2023-11-30 ENCOUNTER — Inpatient Hospital Stay (HOSPITAL_COMMUNITY)

## 2023-11-30 DIAGNOSIS — Z1331 Encounter for screening for depression: Secondary | ICD-10-CM

## 2023-11-30 NOTE — Progress Notes (Signed)
 [facility] nurse called reporting pt PHQ9 score of 20, no suicidal ideations. Was previously on meds for depression, wants to discuss with provider at visit about restarting those. Secure chat to [facility] as well as referral placed.

## 2023-12-19 ENCOUNTER — Ambulatory Visit: Admitting: Licensed Clinical Social Worker

## 2023-12-19 DIAGNOSIS — Z91199 Patient's noncompliance with other medical treatment and regimen due to unspecified reason: Secondary | ICD-10-CM

## 2023-12-19 NOTE — BH Specialist Note (Signed)
 Virtual link sent to patient on this date. [facility] contacted patient and left a VM. Patient no showed for visit.

## 2023-12-26 ENCOUNTER — Encounter: Payer: Self-pay | Admitting: Advanced Practice Midwife

## 2023-12-26 ENCOUNTER — Other Ambulatory Visit (HOSPITAL_COMMUNITY)
Admission: RE | Admit: 2023-12-26 | Discharge: 2023-12-26 | Disposition: A | Discharge status: Home / Self Care | Source: Ambulatory Visit | Attending: Advanced Practice Midwife | Admitting: Advanced Practice Midwife

## 2023-12-26 ENCOUNTER — Ambulatory Visit: Admitting: Advanced Practice Midwife

## 2023-12-26 DIAGNOSIS — Z124 Encounter for screening for malignant neoplasm of cervix: Secondary | ICD-10-CM | POA: Diagnosis present

## 2023-12-26 DIAGNOSIS — Z3202 Encounter for pregnancy test, result negative: Secondary | ICD-10-CM

## 2023-12-26 DIAGNOSIS — Z975 Presence of (intrauterine) contraceptive device: Secondary | ICD-10-CM

## 2023-12-26 DIAGNOSIS — F17200 Nicotine dependence, unspecified, uncomplicated: Secondary | ICD-10-CM

## 2023-12-26 DIAGNOSIS — Z30017 Encounter for initial prescription of implantable subdermal contraceptive: Secondary | ICD-10-CM | POA: Diagnosis not present

## 2023-12-26 LAB — POCT URINE PREGNANCY: Preg Test, Ur: NEGATIVE

## 2023-12-26 MED ORDER — ETONOGESTREL 68 MG ~~LOC~~ IMPL
68.0000 mg | DRUG_IMPLANT | Freq: Once | SUBCUTANEOUS | Status: AC
  Administered 2023-12-26: 68 mg via SUBCUTANEOUS

## 2023-12-26 NOTE — Progress Notes (Signed)
 Pt presents for PP visit. Negative [place] today. Pt has questions about abnormal PAP and next steps.

## 2023-12-26 NOTE — Progress Notes (Signed)
 Post Partum Visit Note  Lori Lloyd is a 28 y.o. [phone] female who presents for a postpartum visit. She is 5 weeks postpartum following a normal spontaneous vaginal delivery.  I have fully reviewed the prenatal and intrapartum course. The delivery was at [redacted]w[redacted]d gestational weeks.  Anesthesia: epidural. Postpartum course has been good. Baby is doing well. Baby is feeding by both breast and bottle - Similac Advance. Bleeding no bleeding. Bowel function is normal. Bladder function is normal. Patient is not sexually active. Contraception method is Nexplanon . Postpartum depression screening: negative.   The pregnancy intention screening data noted above was reviewed. Potential methods of contraception were discussed. The patient elected to proceed with Nexplanon .     Health Maintenance Due  Topic Date Due   Pneumococcal Vaccine 28-68 Years old (1 of 2 - PCV) Never done   Hepatitis B Vaccines (1 of 3 - 19+ 3-dose series) Never done   Cervical Cancer Screening (Pap smear)  02/11/2019   HPV VACCINES (1 - 3-dose SCDM series) Never done   COVID-19 Vaccine (1 - 2024-25 season) Never done    The following portions of the patient's history were reviewed and updated as appropriate: allergies, current medications, past family history, past medical history, past social history, past surgical history, and problem list.  Review of Systems Pertinent items are noted in HPI.  Objective:  LMP 02/16/2023 (Approximate)    General:  alert   Breasts:  not indicated  Lungs: Normal effort  Heart:  Regular rate  Abdomen: soft, non-tender; bowel sounds normal; no masses,  no organomegaly   GU exam:  normal       Assessment:   1. Postpartum care following vaginal delivery (Primary) -doing well  2. Nexplanon  insertion -Nexplanon  placed in right arm  3. Screening for cervical cancer -hx of abnormal pap at [facility] 2 years ago, colposcopy was recommended but patient did not follow up. Hx of  ASC-H pap in 2017. Patient will call old pediatrician and ask if she received HPV vaccine previously - Cytology - PAP( [place])   Plan:   Essential components of care per ACOG recommendations:  1.  Mood and well being: Patient with negative depression screening today. Reviewed local resources for support.  - Patient tobacco use? Yes. Patient desires to quit? No.   - hx of drug use? No.    2. Infant care and feeding:  -Patient currently breastmilk feeding? Yes. Reviewed importance of draining breast regularly to support lactation.  -Social determinants of health (SDOH) reviewed in EPIC. No concerns.  3. Sexuality, contraception and birth spacing - Patient does not want a pregnancy in the next year.  Desired family size is 3 children.  - Reviewed reproductive life planning. Reviewed contraceptive methods based on pt preferences and effectiveness.  Patient desired Hormonal Implant today.   - Discussed birth spacing of 18 months  4. Sleep and fatigue -Encouraged family/partner/community support of 4 hrs of uninterrupted sleep to help with mood and fatigue  5. Physical Recovery  - Discussed patients delivery and complications. She describes her labor as good. - Patient had a Vaginal, no problems at delivery. Patient had a 1st degree laceration. Perineal healing reviewed. Patient expressed understanding - Patient has urinary incontinence? No. - Patient can resume physical and sexual activity as desired.  6.  Health Maintenance - HM due items addressed Yes - Last pap smear at [facility] 2 years ago and was abnormal, colpo recommended but not done. Pap smear  done at today's visit.  -Breast Cancer screening indicated? No.   7. Chronic Disease/Pregnancy Condition follow up: None  - PCP follow up    Nexplanon  Insertion Procedure Patient identified, informed consent performed, consent signed.   Patient does understand that irregular bleeding is a very common side effect of this  medication. She was advised to have backup contraception for one week after placement. Pregnancy test in clinic today was negative.  Appropriate time out taken.  Patient's right arm was prepped and draped in the usual sterile fashion.. The ruler used to measure and mark insertion area.  Patient was prepped with alcohol swab and then injected with 3 ml of 1% lidocaine .  She was prepped with betadine, Nexplanon  removed from packaging,  Device confirmed in needle, then inserted full length of needle and withdrawn per handbook instructions. Nexplanon  was able to palpated in the patient's arm; patient palpated the insert herself. There was minimal blood loss.  Patient insertion site covered with guaze and a pressure bandage to reduce any bruising.  The patient tolerated the procedure well and was given post procedure instructions.     Elenor Mole, SWHNP 12/26/23  Midwife Attestation:  I personally saw and evaluated the patient, performing the key elements of the service. I developed and verified the management plan that is described in the resident's/student's note, and I agree with the content with my edits above. VSS, HRR&R, Resp unlabored, Legs neg.    Olam Boards, CNM 3:45 AM

## 2023-12-30 ENCOUNTER — Encounter: Payer: Self-pay | Admitting: Advanced Practice Midwife

## 2023-12-30 DIAGNOSIS — Z975 Presence of (intrauterine) contraceptive device: Secondary | ICD-10-CM | POA: Insufficient documentation

## 2023-12-30 DIAGNOSIS — F17200 Nicotine dependence, unspecified, uncomplicated: Secondary | ICD-10-CM | POA: Insufficient documentation

## 2024-01-02 LAB — CYTOLOGY - PAP: Diagnosis: HIGH — AB

## 2024-01-03 ENCOUNTER — Ambulatory Visit: Payer: Self-pay | Admitting: Advanced Practice Midwife

## 2024-01-12 ENCOUNTER — Ambulatory Visit: Admitting: Obstetrics and Gynecology

## 2024-02-13 ENCOUNTER — Ambulatory Visit: Admitting: Obstetrics and Gynecology

## 2024-03-05 ENCOUNTER — Ambulatory Visit: Admitting: Obstetrics and Gynecology
